# Patient Record
Sex: Female | Born: 1960 | Race: White | Hispanic: No | Marital: Married | Smoking: Never smoker
Health system: Southern US, Community
[De-identification: ages and names within clinical notes are randomized; demographics above are authoritative.]

## PROBLEM LIST (undated history)

## (undated) DIAGNOSIS — Z809 Family history of malignant neoplasm, unspecified: Secondary | ICD-10-CM

## (undated) DIAGNOSIS — B958 Unspecified staphylococcus as the cause of diseases classified elsewhere: Secondary | ICD-10-CM

## (undated) DIAGNOSIS — M1990 Unspecified osteoarthritis, unspecified site: Secondary | ICD-10-CM

## (undated) DIAGNOSIS — B880 Other acariasis: Secondary | ICD-10-CM

## (undated) DIAGNOSIS — M797 Fibromyalgia: Secondary | ICD-10-CM

## (undated) DIAGNOSIS — F329 Major depressive disorder, single episode, unspecified: Secondary | ICD-10-CM

## (undated) DIAGNOSIS — Z8739 Personal history of other diseases of the musculoskeletal system and connective tissue: Secondary | ICD-10-CM

## (undated) DIAGNOSIS — Z1502 Genetic susceptibility to malignant neoplasm of ovary: Secondary | ICD-10-CM

## (undated) DIAGNOSIS — E039 Hypothyroidism, unspecified: Secondary | ICD-10-CM

## (undated) DIAGNOSIS — Z9221 Personal history of antineoplastic chemotherapy: Secondary | ICD-10-CM

## (undated) DIAGNOSIS — Z853 Personal history of malignant neoplasm of breast: Secondary | ICD-10-CM

## (undated) DIAGNOSIS — C8230 Follicular lymphoma grade IIIa, unspecified site: Secondary | ICD-10-CM

## (undated) DIAGNOSIS — Z1501 Genetic susceptibility to malignant neoplasm of breast: Secondary | ICD-10-CM

## (undated) DIAGNOSIS — E785 Hyperlipidemia, unspecified: Secondary | ICD-10-CM

## (undated) DIAGNOSIS — E669 Obesity, unspecified: Secondary | ICD-10-CM

## (undated) DIAGNOSIS — C50911 Malignant neoplasm of unspecified site of right female breast: Secondary | ICD-10-CM

## (undated) DIAGNOSIS — C50919 Malignant neoplasm of unspecified site of unspecified female breast: Secondary | ICD-10-CM

## (undated) DIAGNOSIS — Z8481 Family history of carrier of genetic disease: Secondary | ICD-10-CM

## (undated) DIAGNOSIS — Z973 Presence of spectacles and contact lenses: Secondary | ICD-10-CM

## (undated) DIAGNOSIS — M8580 Other specified disorders of bone density and structure, unspecified site: Secondary | ICD-10-CM

## (undated) DIAGNOSIS — F419 Anxiety disorder, unspecified: Secondary | ICD-10-CM

## (undated) DIAGNOSIS — Z923 Personal history of irradiation: Secondary | ICD-10-CM

## (undated) DIAGNOSIS — F32A Depression, unspecified: Secondary | ICD-10-CM

## (undated) DIAGNOSIS — G620 Drug-induced polyneuropathy: Secondary | ICD-10-CM

## (undated) DIAGNOSIS — N179 Acute kidney failure, unspecified: Secondary | ICD-10-CM

## (undated) DIAGNOSIS — M654 Radial styloid tenosynovitis [de Quervain]: Secondary | ICD-10-CM

## (undated) DIAGNOSIS — Z1509 Genetic susceptibility to other malignant neoplasm: Secondary | ICD-10-CM

## (undated) DIAGNOSIS — G629 Polyneuropathy, unspecified: Secondary | ICD-10-CM

## (undated) DIAGNOSIS — S99912A Unspecified injury of left ankle, initial encounter: Secondary | ICD-10-CM

## (undated) DIAGNOSIS — T451X5A Adverse effect of antineoplastic and immunosuppressive drugs, initial encounter: Secondary | ICD-10-CM

## (undated) HISTORY — DX: Acute kidney failure, unspecified: N17.9

## (undated) HISTORY — PX: OTHER SURGICAL HISTORY: SHX169

## (undated) HISTORY — DX: Other specified disorders of bone density and structure, unspecified site: M85.80

## (undated) HISTORY — DX: Hypothyroidism, unspecified: E03.9

## (undated) HISTORY — DX: Unspecified osteoarthritis, unspecified site: M19.90

## (undated) HISTORY — DX: Family history of malignant neoplasm, unspecified: Z80.9

## (undated) HISTORY — DX: Malignant neoplasm of unspecified site of right female breast: C50.911

## (undated) HISTORY — DX: Family history of carrier of genetic disease: Z84.81

## (undated) HISTORY — DX: Personal history of irradiation: Z92.3

---

## 1989-03-14 HISTORY — PX: MANDIBLE SURGERY: SHX707

## 1997-09-05 ENCOUNTER — Other Ambulatory Visit: Admission: RE | Admit: 1997-09-05 | Discharge: 1997-09-05 | Payer: Self-pay | Admitting: Obstetrics & Gynecology

## 1998-08-26 ENCOUNTER — Other Ambulatory Visit: Admission: RE | Admit: 1998-08-26 | Discharge: 1998-08-26 | Payer: Self-pay | Admitting: Obstetrics & Gynecology

## 1999-10-29 ENCOUNTER — Other Ambulatory Visit: Admission: RE | Admit: 1999-10-29 | Discharge: 1999-10-29 | Payer: Self-pay | Admitting: Obstetrics & Gynecology

## 1999-12-01 ENCOUNTER — Encounter: Payer: Self-pay | Admitting: General Surgery

## 1999-12-01 ENCOUNTER — Encounter: Admission: RE | Admit: 1999-12-01 | Discharge: 1999-12-01 | Payer: Self-pay | Admitting: General Surgery

## 2000-05-16 ENCOUNTER — Encounter: Admission: RE | Admit: 2000-05-16 | Discharge: 2000-05-16 | Payer: Self-pay | Admitting: General Surgery

## 2000-05-16 ENCOUNTER — Encounter: Payer: Self-pay | Admitting: General Surgery

## 2000-11-16 ENCOUNTER — Encounter: Admission: RE | Admit: 2000-11-16 | Discharge: 2000-11-16 | Payer: Self-pay | Admitting: Obstetrics & Gynecology

## 2000-11-16 ENCOUNTER — Encounter: Payer: Self-pay | Admitting: Obstetrics & Gynecology

## 2001-01-25 ENCOUNTER — Other Ambulatory Visit: Admission: RE | Admit: 2001-01-25 | Discharge: 2001-01-25 | Payer: Self-pay | Admitting: Obstetrics & Gynecology

## 2002-01-30 ENCOUNTER — Encounter: Admission: RE | Admit: 2002-01-30 | Discharge: 2002-01-30 | Payer: Self-pay | Admitting: Obstetrics & Gynecology

## 2002-01-30 ENCOUNTER — Encounter: Payer: Self-pay | Admitting: Obstetrics & Gynecology

## 2002-04-04 ENCOUNTER — Other Ambulatory Visit: Admission: RE | Admit: 2002-04-04 | Discharge: 2002-04-04 | Payer: Self-pay | Admitting: Obstetrics & Gynecology

## 2003-04-11 ENCOUNTER — Encounter: Admission: RE | Admit: 2003-04-11 | Discharge: 2003-04-11 | Payer: Self-pay | Admitting: Obstetrics & Gynecology

## 2003-04-16 ENCOUNTER — Other Ambulatory Visit: Admission: RE | Admit: 2003-04-16 | Discharge: 2003-04-16 | Payer: Self-pay | Admitting: Obstetrics & Gynecology

## 2004-12-28 ENCOUNTER — Other Ambulatory Visit: Admission: RE | Admit: 2004-12-28 | Discharge: 2004-12-28 | Payer: Self-pay | Admitting: Obstetrics & Gynecology

## 2005-01-14 ENCOUNTER — Encounter: Admission: RE | Admit: 2005-01-14 | Discharge: 2005-01-14 | Payer: Self-pay | Admitting: Obstetrics & Gynecology

## 2006-03-09 ENCOUNTER — Encounter: Admission: RE | Admit: 2006-03-09 | Discharge: 2006-03-09 | Payer: Self-pay | Admitting: Obstetrics & Gynecology

## 2007-03-12 ENCOUNTER — Encounter: Admission: RE | Admit: 2007-03-12 | Discharge: 2007-03-12 | Payer: Self-pay | Admitting: Obstetrics & Gynecology

## 2008-04-16 ENCOUNTER — Encounter: Admission: RE | Admit: 2008-04-16 | Discharge: 2008-04-16 | Payer: Self-pay | Admitting: Obstetrics & Gynecology

## 2010-03-14 HISTORY — PX: BREAST LUMPECTOMY: SHX2

## 2010-06-04 ENCOUNTER — Other Ambulatory Visit: Payer: Self-pay | Admitting: Obstetrics & Gynecology

## 2010-06-04 DIAGNOSIS — Z1231 Encounter for screening mammogram for malignant neoplasm of breast: Secondary | ICD-10-CM

## 2010-06-09 ENCOUNTER — Ambulatory Visit
Admission: RE | Admit: 2010-06-09 | Discharge: 2010-06-09 | Disposition: A | Discharge status: Home / Self Care | Payer: BC Managed Care – PPO | Source: Ambulatory Visit | Attending: Obstetrics & Gynecology | Admitting: Obstetrics & Gynecology

## 2010-06-09 DIAGNOSIS — Z1231 Encounter for screening mammogram for malignant neoplasm of breast: Secondary | ICD-10-CM

## 2010-06-11 ENCOUNTER — Other Ambulatory Visit: Payer: Self-pay | Admitting: Obstetrics & Gynecology

## 2010-06-11 DIAGNOSIS — R928 Other abnormal and inconclusive findings on diagnostic imaging of breast: Secondary | ICD-10-CM

## 2010-06-16 ENCOUNTER — Ambulatory Visit
Admission: RE | Admit: 2010-06-16 | Discharge: 2010-06-16 | Disposition: A | Discharge status: Home / Self Care | Payer: BC Managed Care – PPO | Source: Ambulatory Visit | Attending: Obstetrics & Gynecology | Admitting: Obstetrics & Gynecology

## 2010-06-16 ENCOUNTER — Other Ambulatory Visit: Payer: Self-pay | Admitting: Obstetrics & Gynecology

## 2010-06-16 DIAGNOSIS — R928 Other abnormal and inconclusive findings on diagnostic imaging of breast: Secondary | ICD-10-CM

## 2010-06-16 DIAGNOSIS — N6310 Unspecified lump in the right breast, unspecified quadrant: Secondary | ICD-10-CM

## 2010-06-21 ENCOUNTER — Ambulatory Visit
Admission: RE | Admit: 2010-06-21 | Discharge: 2010-06-21 | Disposition: A | Discharge status: Home / Self Care | Payer: BC Managed Care – PPO | Source: Ambulatory Visit | Attending: Obstetrics & Gynecology | Admitting: Obstetrics & Gynecology

## 2010-06-21 ENCOUNTER — Other Ambulatory Visit: Payer: Self-pay | Admitting: Obstetrics & Gynecology

## 2010-06-21 ENCOUNTER — Other Ambulatory Visit: Payer: Self-pay | Admitting: Diagnostic Radiology

## 2010-06-21 DIAGNOSIS — N6310 Unspecified lump in the right breast, unspecified quadrant: Secondary | ICD-10-CM

## 2010-06-21 DIAGNOSIS — R928 Other abnormal and inconclusive findings on diagnostic imaging of breast: Secondary | ICD-10-CM

## 2010-06-22 ENCOUNTER — Other Ambulatory Visit: Payer: Self-pay | Admitting: Obstetrics & Gynecology

## 2010-06-22 DIAGNOSIS — C50911 Malignant neoplasm of unspecified site of right female breast: Secondary | ICD-10-CM

## 2010-06-25 ENCOUNTER — Ambulatory Visit
Admission: RE | Admit: 2010-06-25 | Discharge: 2010-06-25 | Disposition: A | Discharge status: Home / Self Care | Payer: BC Managed Care – PPO | Source: Ambulatory Visit | Attending: Obstetrics & Gynecology | Admitting: Obstetrics & Gynecology

## 2010-06-25 DIAGNOSIS — C50911 Malignant neoplasm of unspecified site of right female breast: Secondary | ICD-10-CM

## 2010-06-25 MED ORDER — GADOBENATE DIMEGLUMINE 529 MG/ML IV SOLN
17.0000 mL | Freq: Once | INTRAVENOUS | Status: AC | PRN
  Administered 2010-06-25: 17 mL via INTRAVENOUS

## 2010-06-30 ENCOUNTER — Other Ambulatory Visit: Payer: Self-pay | Admitting: Oncology

## 2010-06-30 ENCOUNTER — Other Ambulatory Visit: Payer: Self-pay | Admitting: General Surgery

## 2010-06-30 ENCOUNTER — Encounter (HOSPITAL_BASED_OUTPATIENT_CLINIC_OR_DEPARTMENT_OTHER): Payer: BC Managed Care – PPO | Admitting: Oncology

## 2010-06-30 DIAGNOSIS — C50919 Malignant neoplasm of unspecified site of unspecified female breast: Secondary | ICD-10-CM

## 2010-06-30 DIAGNOSIS — C50419 Malignant neoplasm of upper-outer quadrant of unspecified female breast: Secondary | ICD-10-CM

## 2010-06-30 DIAGNOSIS — E039 Hypothyroidism, unspecified: Secondary | ICD-10-CM

## 2010-06-30 LAB — COMPREHENSIVE METABOLIC PANEL
ALT: 19 U/L (ref 0–35)
AST: 19 U/L (ref 0–37)
Alkaline Phosphatase: 73 U/L (ref 39–117)
Calcium: 9.3 mg/dL (ref 8.4–10.5)
Creatinine, Ser: 0.72 mg/dL (ref 0.40–1.20)
Glucose, Bld: 114 mg/dL — ABNORMAL HIGH (ref 70–99)
Potassium: 3.5 mEq/L (ref 3.5–5.3)
Total Bilirubin: 1.1 mg/dL (ref 0.3–1.2)

## 2010-06-30 LAB — CBC WITH DIFFERENTIAL/PLATELET
BASO%: 0.6 % (ref 0.0–2.0)
Basophils Absolute: 0.1 10*3/uL (ref 0.0–0.1)
Eosinophils Absolute: 0.1 10*3/uL (ref 0.0–0.5)
HGB: 14.5 g/dL (ref 11.6–15.9)
MCH: 31.3 pg (ref 25.1–34.0)
MCHC: 34.7 g/dL (ref 31.5–36.0)
MCV: 90 fL (ref 79.5–101.0)
MONO#: 0.6 10*3/uL (ref 0.1–0.9)
Platelets: 172 10*3/uL (ref 145–400)

## 2010-07-02 ENCOUNTER — Encounter: Payer: BC Managed Care – PPO | Admitting: Genetic Counselor

## 2010-07-06 ENCOUNTER — Ambulatory Visit (HOSPITAL_COMMUNITY)
Admission: RE | Admit: 2010-07-06 | Discharge: 2010-07-06 | Disposition: A | Discharge status: Home / Self Care | Payer: BC Managed Care – PPO | Source: Ambulatory Visit | Attending: Oncology | Admitting: Oncology

## 2010-07-06 ENCOUNTER — Encounter (HOSPITAL_COMMUNITY)
Admission: RE | Admit: 2010-07-06 | Discharge: 2010-07-06 | Disposition: A | Discharge status: Home / Self Care | Payer: BC Managed Care – PPO | Source: Ambulatory Visit | Attending: Oncology | Admitting: Oncology

## 2010-07-06 ENCOUNTER — Encounter (HOSPITAL_COMMUNITY)
Admission: RE | Admit: 2010-07-06 | Discharge: 2010-07-06 | Disposition: A | Discharge status: Home / Self Care | Payer: BC Managed Care – PPO | Source: Ambulatory Visit | Attending: General Surgery | Admitting: General Surgery

## 2010-07-06 ENCOUNTER — Encounter (HOSPITAL_COMMUNITY): Payer: Self-pay

## 2010-07-06 ENCOUNTER — Ambulatory Visit (HOSPITAL_COMMUNITY)
Admission: RE | Admit: 2010-07-06 | Discharge: 2010-07-06 | Disposition: A | Discharge status: Home / Self Care | Payer: BC Managed Care – PPO | Source: Ambulatory Visit | Attending: General Surgery | Admitting: General Surgery

## 2010-07-06 ENCOUNTER — Other Ambulatory Visit (HOSPITAL_COMMUNITY): Payer: Self-pay | Admitting: General Surgery

## 2010-07-06 DIAGNOSIS — C50919 Malignant neoplasm of unspecified site of unspecified female breast: Secondary | ICD-10-CM

## 2010-07-06 DIAGNOSIS — C50419 Malignant neoplasm of upper-outer quadrant of unspecified female breast: Secondary | ICD-10-CM | POA: Insufficient documentation

## 2010-07-06 DIAGNOSIS — Z01810 Encounter for preprocedural cardiovascular examination: Secondary | ICD-10-CM | POA: Insufficient documentation

## 2010-07-06 DIAGNOSIS — R05 Cough: Secondary | ICD-10-CM | POA: Insufficient documentation

## 2010-07-06 DIAGNOSIS — Z01818 Encounter for other preprocedural examination: Secondary | ICD-10-CM | POA: Insufficient documentation

## 2010-07-06 DIAGNOSIS — Z01812 Encounter for preprocedural laboratory examination: Secondary | ICD-10-CM | POA: Insufficient documentation

## 2010-07-06 DIAGNOSIS — C50911 Malignant neoplasm of unspecified site of right female breast: Secondary | ICD-10-CM

## 2010-07-06 DIAGNOSIS — R059 Cough, unspecified: Secondary | ICD-10-CM | POA: Insufficient documentation

## 2010-07-06 LAB — SURGICAL PCR SCREEN
MRSA, PCR: NEGATIVE
Staphylococcus aureus: POSITIVE — AB

## 2010-07-06 MED ORDER — TECHNETIUM TC 99M MEDRONATE IV KIT
23.6000 | PACK | Freq: Once | INTRAVENOUS | Status: AC | PRN
  Administered 2010-07-06: 23.6 via INTRAVENOUS

## 2010-07-06 MED ORDER — IOHEXOL 300 MG/ML  SOLN
80.0000 mL | Freq: Once | INTRAMUSCULAR | Status: AC | PRN
  Administered 2010-07-06: 80 mL via INTRAVENOUS

## 2010-07-08 ENCOUNTER — Ambulatory Visit
Admission: RE | Admit: 2010-07-08 | Discharge: 2010-07-08 | Disposition: A | Discharge status: Home / Self Care | Payer: BC Managed Care – PPO | Source: Ambulatory Visit | Attending: General Surgery | Admitting: General Surgery

## 2010-07-08 ENCOUNTER — Observation Stay (HOSPITAL_COMMUNITY)
Admission: RE | Admit: 2010-07-08 | Discharge: 2010-07-09 | Disposition: A | Discharge status: Home / Self Care | Payer: BC Managed Care – PPO | Source: Ambulatory Visit | Attending: General Surgery | Admitting: General Surgery

## 2010-07-08 ENCOUNTER — Ambulatory Visit (HOSPITAL_COMMUNITY): Payer: BC Managed Care – PPO

## 2010-07-08 ENCOUNTER — Other Ambulatory Visit: Payer: Self-pay | Admitting: General Surgery

## 2010-07-08 DIAGNOSIS — C50919 Malignant neoplasm of unspecified site of unspecified female breast: Secondary | ICD-10-CM

## 2010-07-08 DIAGNOSIS — C773 Secondary and unspecified malignant neoplasm of axilla and upper limb lymph nodes: Secondary | ICD-10-CM | POA: Insufficient documentation

## 2010-07-08 HISTORY — PX: BREAST LUMPECTOMY WITH AXILLARY LYMPH NODE BIOPSY: SHX5593

## 2010-07-20 ENCOUNTER — Ambulatory Visit (HOSPITAL_COMMUNITY)
Admission: RE | Admit: 2010-07-20 | Discharge: 2010-07-20 | Disposition: A | Discharge status: Home / Self Care | Payer: BC Managed Care – PPO | Source: Ambulatory Visit | Attending: Oncology | Admitting: Oncology

## 2010-07-20 DIAGNOSIS — Z8249 Family history of ischemic heart disease and other diseases of the circulatory system: Secondary | ICD-10-CM | POA: Insufficient documentation

## 2010-07-20 DIAGNOSIS — C50919 Malignant neoplasm of unspecified site of unspecified female breast: Secondary | ICD-10-CM | POA: Insufficient documentation

## 2010-07-20 DIAGNOSIS — I050 Rheumatic mitral stenosis: Secondary | ICD-10-CM | POA: Insufficient documentation

## 2010-07-20 NOTE — Op Note (Signed)
NAMECAMELLIA, [name]             ACCOUNT NO.:  [id number]  MEDICAL RECORD NO.:  [id number]           PATIENT TYPE:  O  LOCATION:  5125                         FACILITY:  [facility]  PHYSICIAN:  Almond Lint, MD       DATE OF BIRTH:  04-19-1960  DATE OF PROCEDURE:  07/08/2010 DATE OF DISCHARGE:                              OPERATIVE REPORT   PREOPERATIVE DIAGNOSIS:  Right breast cancer, clinical T1, N1, Mx.  POSTOPERATIVE DIAGNOSIS:  Right breast cancer, clinical T1, N1, Mx.  PROCEDURES:  Left subclavian Port-A-Cath, right needle-localized segmental mastectomy, and right axillary lymph node dissection.  SURGEON:  Almond Lint, MD  ASSISTANT:  Anselm Pancoast. Zachery Dakins, MD  ANESTHESIA:  General and local.  FINDINGS: 1. Port-A-Cath in the SVC, good aspiration and easy flush. 2. Clip and  wire in breast specimen of the segmental mastectomy for     the axillary lymph node dissection, multiple small palpable nodes,     and good thoracodorsal and long thoracic nerve function at the     beginning and end of the operation.  SPECIMENS:  Right needle-localized segmental mastectomy and right axillary lymph node dissection to Pathology.  ESTIMATED BLOOD LOSS:  Minimal.  COMPLICATIONS:  None known.  PROCEDURE:  [name] was identified in holding area and taken to the operating room where she was placed on the operating table.  General anesthesia was induced.  The shoulder roll was placed and her left arm was tucked.  Her upper chest, neck, and right arm were prepped and draped in a sterile fashion.  A time-out was performed according to surgical safety checklist.  When all was correct, we continued.  The left infraclavicular region was anesthetized with local anesthetic and the subclavian vein was accessed with a large bore needle.  The wire passed easily and ectopy was seen.  Fluoroscopy was used to confirm the wire was in the venous system.  The site for the Port-A-Cath pocket  was then created with a #15 blade.  This was approximately a 3.5-cm incision.  The subcutaneous tissues were divided all the way to the pectoralis fascia and the pocket was created on the pectoralis fascia. The Port-A-Cath was secured down to the pectoralis fascia with 2-0 Prolene x4.  A tunneler was used to pull the catheter through the wire exit site.  The skin was nicked with a #11 blade.  The tunneler and dilator sheath were passed over the wire under fluoroscopy and then dilator, sheath, and wire were removed.  The catheter was advanced through the tunneler sheaths and the tunneler sheath was pulled away. Fluoroscopy was used to confirm.  Catheter was in good position in the SVC.  The Port-A-Cath was aspirated and flushed with dilute heparinized saline.  This was then flushed with concentrated saline and the skin of the incisions was closed with 3-0 Vicryl deep dermal and 4-0 Monocryl running subcuticular stitches.    Attention was then directed to the right breast.  The site of the tumor was identified with an X and a radial incision was made overlying the mass.  Skin hooks were used to assist with creation  of the flaps around the mass.  The wire was pulled through the skin and the breast specimen was elevated with Allis clamps. The curved Mayo scissors were used to take the specimen around sharply. The specimen was then marked with margin marker kit and specimen radiograph confirmed that the hook and the clips were in the specimen as well as the mass.  Hemostasis was then achieved with cautery and the breast incision was packed with few Ray-Tecs.  Attention was then directed to the axilla.  A curvilinear incision was made with a #10 blade.  Thick flaps were created with the cautery.  The lymph node packet was held with 2 Allis clamps and the pectoralis fascia was identified with the cautery.  The small vessels entering this area were clipped.  The lymph node packet was swept off  the lateral chest wall and the  long thoracic nerve was identified and tested.  This was intact.  The tissue overlying this was taken with a combination of cautery, scissors, and clips on the lymphovascular channels.  Attention was directed superiorly.  Sharp dissection combined with clips on the small vessels was then used to identify the undersurface of the axillary vein.  The lymphatic packet was swept off this and the intercostobrachial nerve had to be divided as it went through the center of the lymph node packet.  There was a small vessel coming off axillary vein that was clipped and divided.  The thoracodorsal nerve was identified and was intact.  The lymph node pocket was swept off the anterior aspect of this.  The tissue was then taken in its entirety and passed off the table.  There were several additional lymph nodes that were palpable that were taken as well.  This was then irrigated copiously.  A #19 Blake drain was placed in the axilla.  The skin was then reapproximated with 3-0 Vicryl deep dermal sutures and 4-0 Monocryl running subcuticular sutures.  The wound was then cleaned, dried, and dressed with Steri-Strips, gauze, and the breast binder.  The breast incision was then closed as well with 3-0 Vicryl and 4-0 Monocryl.  All of the incisions were dressed with Steri-Strips, gauze, and the breast binder. The patient was awakened from anesthesia and taken to PACU in stable condition.  Needle, sponge, and instrument counts were correct x2.     Almond Lint, MD     FB/MEDQ  D:  07/08/2010  T:  07/09/2010  Job:  [id number]  Electronically Signed by Almond Lint MD on 07/20/2010 03:44:37 PM

## 2010-07-21 NOTE — Discharge Summary (Signed)
  [name], [name]             ACCOUNT NO.:  [id number]  MEDICAL RECORD NO.:  [id number]           PATIENT TYPE:  O  LOCATION:  5125                         FACILITY:  [facility]  PHYSICIAN:  Almond Lint, MD       DATE OF BIRTH:  1960/07/02  DATE OF ADMISSION:  07/08/2010 DATE OF DISCHARGE:  07/09/2010                              DISCHARGE SUMMARY   FINAL DIAGNOSIS:  Right breast cancer, clinical T1, N1, Mx.  PRINCIPAL DIAGNOSIS:  Left subclavian Port-A-Cath right needle-localized segmental mastectomy, and right axillary lymph node dissection.  SECONDARY DIAGNOSES:  Hypothyroidism and osteoarthritis.  DISCHARGE MEDICATIONS: 1. Ibuprofen 400 mg q.8 hours p.r.n. 2. Vitamin D3 over the counter 1 tablet once a day. 3. Calcium carbonate/vitamin 1 tablet once a day. 4. Multivitamin once a day, 5. Flaxseed oil over the counter once a day. 6. Levothyroxine 75 mcg once a day. 7. Vicodin 5/325 one to two tablets p.o. q.4 hours p.r.n. pain.  DISCHARGE INSTRUCTIONS:  No lifting for 2 weeks while the drain is in. No driving for 1-2 weeks.  No shower while drain is in.  Measure cord JP twice a day.  Breath binder for comfort.  HOSPITAL COURSE:  [name] was admitted for observation and following her right partial mastectomy and axillary lymph node dissection.  She is very well postoperatively with nausea and shortness. She was able to be discharged the following day.  Her wounds were okay and she was able to be discharged in good condition.     Almond Lint, MD     FB/MEDQ  D:  07/20/2010  T:  07/21/2010  Job:  [id number]  Electronically Signed by Almond Lint MD on 07/21/2010 12:11:48 PM

## 2010-08-06 ENCOUNTER — Other Ambulatory Visit: Payer: Self-pay | Admitting: Oncology

## 2010-08-06 ENCOUNTER — Encounter (HOSPITAL_BASED_OUTPATIENT_CLINIC_OR_DEPARTMENT_OTHER): Payer: BC Managed Care – PPO | Admitting: Oncology

## 2010-08-06 DIAGNOSIS — C50419 Malignant neoplasm of upper-outer quadrant of unspecified female breast: Secondary | ICD-10-CM

## 2010-08-06 DIAGNOSIS — C773 Secondary and unspecified malignant neoplasm of axilla and upper limb lymph nodes: Secondary | ICD-10-CM

## 2010-08-06 LAB — CBC WITH DIFFERENTIAL/PLATELET
Basophils Absolute: 0 10*3/uL (ref 0.0–0.1)
Eosinophils Absolute: 0.2 10*3/uL (ref 0.0–0.5)
HGB: 14.1 g/dL (ref 11.6–15.9)
MCV: 90.2 fL (ref 79.5–101.0)
MONO#: 0.7 10*3/uL (ref 0.1–0.9)
MONO%: 7.3 % (ref 0.0–14.0)
NEUT#: 7.3 10*3/uL — ABNORMAL HIGH (ref 1.5–6.5)
Platelets: 176 10*3/uL (ref 145–400)
RDW: 12.8 % (ref 11.2–14.5)
WBC: 10.1 10*3/uL (ref 3.9–10.3)

## 2010-08-16 ENCOUNTER — Ambulatory Visit: Payer: BC Managed Care – PPO | Attending: Oncology | Admitting: Physical Therapy

## 2010-08-16 DIAGNOSIS — I890 Lymphedema, not elsewhere classified: Secondary | ICD-10-CM | POA: Insufficient documentation

## 2010-08-16 DIAGNOSIS — IMO0001 Reserved for inherently not codable concepts without codable children: Secondary | ICD-10-CM | POA: Insufficient documentation

## 2010-08-18 ENCOUNTER — Ambulatory Visit: Payer: BC Managed Care – PPO | Admitting: Physical Therapy

## 2010-08-19 ENCOUNTER — Encounter (HOSPITAL_BASED_OUTPATIENT_CLINIC_OR_DEPARTMENT_OTHER): Payer: BC Managed Care – PPO | Admitting: Oncology

## 2010-08-19 ENCOUNTER — Other Ambulatory Visit: Payer: Self-pay | Admitting: Oncology

## 2010-08-19 ENCOUNTER — Ambulatory Visit (HOSPITAL_COMMUNITY)
Admission: RE | Admit: 2010-08-19 | Discharge: 2010-08-19 | Disposition: A | Discharge status: Home / Self Care | Payer: BC Managed Care – PPO | Source: Ambulatory Visit | Attending: Oncology | Admitting: Oncology

## 2010-08-19 ENCOUNTER — Encounter: Payer: BC Managed Care – PPO | Admitting: Physical Therapy

## 2010-08-19 DIAGNOSIS — Y849 Medical procedure, unspecified as the cause of abnormal reaction of the patient, or of later complication, without mention of misadventure at the time of the procedure: Secondary | ICD-10-CM | POA: Insufficient documentation

## 2010-08-19 DIAGNOSIS — Z5111 Encounter for antineoplastic chemotherapy: Secondary | ICD-10-CM

## 2010-08-19 DIAGNOSIS — E039 Hypothyroidism, unspecified: Secondary | ICD-10-CM

## 2010-08-19 DIAGNOSIS — R0789 Other chest pain: Secondary | ICD-10-CM

## 2010-08-19 DIAGNOSIS — C50419 Malignant neoplasm of upper-outer quadrant of unspecified female breast: Secondary | ICD-10-CM

## 2010-08-19 DIAGNOSIS — T82898A Other specified complication of vascular prosthetic devices, implants and grafts, initial encounter: Secondary | ICD-10-CM | POA: Insufficient documentation

## 2010-08-19 LAB — CBC WITH DIFFERENTIAL/PLATELET
Basophils Absolute: 0 10*3/uL (ref 0.0–0.1)
Eosinophils Absolute: 0.2 10*3/uL (ref 0.0–0.5)
HCT: 40.9 % (ref 34.8–46.6)
LYMPH%: 31 % (ref 14.0–49.7)
MCV: 86.3 fL (ref 79.5–101.0)
MONO#: 0.9 10*3/uL (ref 0.1–0.9)
MONO%: 10.1 % (ref 0.0–14.0)
NEUT#: 4.7 10*3/uL (ref 1.5–6.5)
NEUT%: 55.5 % (ref 38.4–76.8)
Platelets: 186 10*3/uL (ref 145–400)
RBC: 4.74 10*6/uL (ref 3.70–5.45)
WBC: 8.4 10*3/uL (ref 3.9–10.3)
nRBC: 0 % (ref 0–0)

## 2010-08-20 ENCOUNTER — Encounter (HOSPITAL_BASED_OUTPATIENT_CLINIC_OR_DEPARTMENT_OTHER): Payer: BC Managed Care – PPO | Admitting: Oncology

## 2010-08-20 DIAGNOSIS — C50419 Malignant neoplasm of upper-outer quadrant of unspecified female breast: Secondary | ICD-10-CM

## 2010-08-20 DIAGNOSIS — Z5189 Encounter for other specified aftercare: Secondary | ICD-10-CM

## 2010-08-23 ENCOUNTER — Ambulatory Visit: Payer: BC Managed Care – PPO | Admitting: Physical Therapy

## 2010-08-25 ENCOUNTER — Other Ambulatory Visit: Payer: Self-pay | Admitting: Oncology

## 2010-08-25 ENCOUNTER — Encounter (HOSPITAL_BASED_OUTPATIENT_CLINIC_OR_DEPARTMENT_OTHER): Payer: BC Managed Care – PPO | Admitting: Oncology

## 2010-08-25 ENCOUNTER — Ambulatory Visit: Payer: BC Managed Care – PPO | Admitting: Physical Therapy

## 2010-08-25 DIAGNOSIS — C50419 Malignant neoplasm of upper-outer quadrant of unspecified female breast: Secondary | ICD-10-CM

## 2010-08-25 DIAGNOSIS — E039 Hypothyroidism, unspecified: Secondary | ICD-10-CM

## 2010-08-25 DIAGNOSIS — Z170 Estrogen receptor positive status [ER+]: Secondary | ICD-10-CM

## 2010-08-25 LAB — CBC WITH DIFFERENTIAL/PLATELET
BASO%: 0.6 % (ref 0.0–2.0)
Basophils Absolute: 0 10*3/uL (ref 0.0–0.1)
EOS%: 8.4 % — ABNORMAL HIGH (ref 0.0–7.0)
HCT: 39.3 % (ref 34.8–46.6)
HGB: 13.4 g/dL (ref 11.6–15.9)
LYMPH%: 43.6 % (ref 14.0–49.7)
MCH: 29.7 pg (ref 25.1–34.0)
MCHC: 34.1 g/dL (ref 31.5–36.0)
MCV: 87.1 fL (ref 79.5–101.0)
MONO%: 3.9 % (ref 0.0–14.0)
NEUT%: 43.5 % (ref 38.4–76.8)
Platelets: 104 10*3/uL — ABNORMAL LOW (ref 145–400)
lymph#: 0.8 10*3/uL — ABNORMAL LOW (ref 0.9–3.3)

## 2010-08-30 ENCOUNTER — Ambulatory Visit: Payer: BC Managed Care – PPO

## 2010-09-01 ENCOUNTER — Ambulatory Visit: Payer: BC Managed Care – PPO | Admitting: Physical Therapy

## 2010-09-02 ENCOUNTER — Encounter (HOSPITAL_BASED_OUTPATIENT_CLINIC_OR_DEPARTMENT_OTHER): Payer: BC Managed Care – PPO | Admitting: Oncology

## 2010-09-02 ENCOUNTER — Other Ambulatory Visit: Payer: Self-pay | Admitting: Oncology

## 2010-09-02 DIAGNOSIS — C773 Secondary and unspecified malignant neoplasm of axilla and upper limb lymph nodes: Secondary | ICD-10-CM

## 2010-09-02 DIAGNOSIS — C50419 Malignant neoplasm of upper-outer quadrant of unspecified female breast: Secondary | ICD-10-CM

## 2010-09-02 DIAGNOSIS — Z5111 Encounter for antineoplastic chemotherapy: Secondary | ICD-10-CM

## 2010-09-02 LAB — COMPREHENSIVE METABOLIC PANEL
AST: 21 U/L (ref 0–37)
Alkaline Phosphatase: 107 U/L (ref 39–117)
BUN: 7 mg/dL (ref 6–23)
Glucose, Bld: 101 mg/dL — ABNORMAL HIGH (ref 70–99)
Sodium: 139 mEq/L (ref 135–145)
Total Bilirubin: 0.3 mg/dL (ref 0.3–1.2)

## 2010-09-02 LAB — CBC WITH DIFFERENTIAL/PLATELET
BASO%: 0.8 % (ref 0.0–2.0)
Basophils Absolute: 0.1 10*3/uL (ref 0.0–0.1)
EOS%: 0.4 % (ref 0.0–7.0)
HGB: 13.2 g/dL (ref 11.6–15.9)
MCH: 29.7 pg (ref 25.1–34.0)
MCHC: 34.3 g/dL (ref 31.5–36.0)
MCV: 86.7 fL (ref 79.5–101.0)
MONO%: 5.9 % (ref 0.0–14.0)
NEUT%: 74.8 % (ref 38.4–76.8)
RDW: 12.8 % (ref 11.2–14.5)
lymph#: 2 10*3/uL (ref 0.9–3.3)

## 2010-09-03 ENCOUNTER — Encounter (HOSPITAL_BASED_OUTPATIENT_CLINIC_OR_DEPARTMENT_OTHER): Payer: BC Managed Care – PPO | Admitting: Oncology

## 2010-09-03 DIAGNOSIS — Z5111 Encounter for antineoplastic chemotherapy: Secondary | ICD-10-CM

## 2010-09-03 DIAGNOSIS — C773 Secondary and unspecified malignant neoplasm of axilla and upper limb lymph nodes: Secondary | ICD-10-CM

## 2010-09-03 DIAGNOSIS — C50419 Malignant neoplasm of upper-outer quadrant of unspecified female breast: Secondary | ICD-10-CM

## 2010-09-08 ENCOUNTER — Encounter: Payer: BC Managed Care – PPO | Admitting: Physical Therapy

## 2010-09-09 ENCOUNTER — Other Ambulatory Visit: Payer: Self-pay | Admitting: Oncology

## 2010-09-09 ENCOUNTER — Encounter (HOSPITAL_BASED_OUTPATIENT_CLINIC_OR_DEPARTMENT_OTHER): Payer: BC Managed Care – PPO | Admitting: Oncology

## 2010-09-09 DIAGNOSIS — Z170 Estrogen receptor positive status [ER+]: Secondary | ICD-10-CM

## 2010-09-09 DIAGNOSIS — C50419 Malignant neoplasm of upper-outer quadrant of unspecified female breast: Secondary | ICD-10-CM

## 2010-09-09 DIAGNOSIS — C773 Secondary and unspecified malignant neoplasm of axilla and upper limb lymph nodes: Secondary | ICD-10-CM

## 2010-09-09 LAB — CBC WITH DIFFERENTIAL/PLATELET
Basophils Absolute: 0 10*3/uL (ref 0.0–0.1)
Eosinophils Absolute: 0 10*3/uL (ref 0.0–0.5)
HGB: 13.5 g/dL (ref 11.6–15.9)
LYMPH%: 52.8 % — ABNORMAL HIGH (ref 14.0–49.7)
MCH: 29.6 pg (ref 25.1–34.0)
MCV: 84.9 fL (ref 79.5–101.0)
MONO%: 12.7 % (ref 0.0–14.0)
NEUT#: 0.4 10*3/uL — CL (ref 1.5–6.5)
NEUT%: 30.3 % — ABNORMAL LOW (ref 38.4–76.8)
Platelets: 101 10*3/uL — ABNORMAL LOW (ref 145–400)

## 2010-09-13 ENCOUNTER — Encounter: Payer: BC Managed Care – PPO | Admitting: Physical Therapy

## 2010-09-16 ENCOUNTER — Other Ambulatory Visit: Payer: Self-pay | Admitting: Oncology

## 2010-09-16 ENCOUNTER — Encounter (HOSPITAL_BASED_OUTPATIENT_CLINIC_OR_DEPARTMENT_OTHER): Payer: BC Managed Care – PPO | Admitting: Oncology

## 2010-09-16 DIAGNOSIS — C50419 Malignant neoplasm of upper-outer quadrant of unspecified female breast: Secondary | ICD-10-CM

## 2010-09-16 DIAGNOSIS — C773 Secondary and unspecified malignant neoplasm of axilla and upper limb lymph nodes: Secondary | ICD-10-CM

## 2010-09-16 DIAGNOSIS — E039 Hypothyroidism, unspecified: Secondary | ICD-10-CM

## 2010-09-16 DIAGNOSIS — Z5111 Encounter for antineoplastic chemotherapy: Secondary | ICD-10-CM

## 2010-09-16 LAB — CBC WITH DIFFERENTIAL/PLATELET
BASO%: 0.8 % (ref 0.0–2.0)
EOS%: 0.1 % (ref 0.0–7.0)
Eosinophils Absolute: 0 10*3/uL (ref 0.0–0.5)
LYMPH%: 16 % (ref 14.0–49.7)
MCH: 29.8 pg (ref 25.1–34.0)
MCHC: 34.9 g/dL (ref 31.5–36.0)
MCV: 85.5 fL (ref 79.5–101.0)
MONO%: 8.9 % (ref 0.0–14.0)
Platelets: 110 10*3/uL — ABNORMAL LOW (ref 145–400)
RBC: 3.79 10*6/uL (ref 3.70–5.45)

## 2010-09-17 ENCOUNTER — Encounter (HOSPITAL_BASED_OUTPATIENT_CLINIC_OR_DEPARTMENT_OTHER): Payer: BC Managed Care – PPO | Admitting: Oncology

## 2010-09-17 DIAGNOSIS — Z5111 Encounter for antineoplastic chemotherapy: Secondary | ICD-10-CM

## 2010-09-17 DIAGNOSIS — C50419 Malignant neoplasm of upper-outer quadrant of unspecified female breast: Secondary | ICD-10-CM

## 2010-09-17 DIAGNOSIS — D702 Other drug-induced agranulocytosis: Secondary | ICD-10-CM

## 2010-09-23 ENCOUNTER — Other Ambulatory Visit: Payer: Self-pay | Admitting: Oncology

## 2010-09-23 ENCOUNTER — Encounter (HOSPITAL_BASED_OUTPATIENT_CLINIC_OR_DEPARTMENT_OTHER): Payer: BC Managed Care – PPO | Admitting: Oncology

## 2010-09-23 DIAGNOSIS — E039 Hypothyroidism, unspecified: Secondary | ICD-10-CM

## 2010-09-23 DIAGNOSIS — C50419 Malignant neoplasm of upper-outer quadrant of unspecified female breast: Secondary | ICD-10-CM

## 2010-09-23 DIAGNOSIS — C773 Secondary and unspecified malignant neoplasm of axilla and upper limb lymph nodes: Secondary | ICD-10-CM

## 2010-09-23 LAB — CBC WITH DIFFERENTIAL/PLATELET
BASO%: 1.8 % (ref 0.0–2.0)
LYMPH%: 56.9 % — ABNORMAL HIGH (ref 14.0–49.7)
MCHC: 34.8 g/dL (ref 31.5–36.0)
MONO#: 0.1 10*3/uL (ref 0.1–0.9)
Platelets: 85 10*3/uL — ABNORMAL LOW (ref 145–400)
RBC: 4.15 10*6/uL (ref 3.70–5.45)
WBC: 1.1 10*3/uL — ABNORMAL LOW (ref 3.9–10.3)
nRBC: 0 % (ref 0–0)

## 2010-09-30 ENCOUNTER — Other Ambulatory Visit: Payer: Self-pay | Admitting: Oncology

## 2010-09-30 ENCOUNTER — Encounter (HOSPITAL_BASED_OUTPATIENT_CLINIC_OR_DEPARTMENT_OTHER): Payer: BC Managed Care – PPO | Admitting: Oncology

## 2010-09-30 ENCOUNTER — Ambulatory Visit (HOSPITAL_COMMUNITY)
Admission: RE | Admit: 2010-09-30 | Discharge: 2010-09-30 | Disposition: A | Discharge status: Home / Self Care | Payer: BC Managed Care – PPO | Source: Ambulatory Visit | Attending: Oncology | Admitting: Oncology

## 2010-09-30 DIAGNOSIS — C50419 Malignant neoplasm of upper-outer quadrant of unspecified female breast: Secondary | ICD-10-CM

## 2010-09-30 DIAGNOSIS — Z5111 Encounter for antineoplastic chemotherapy: Secondary | ICD-10-CM

## 2010-09-30 DIAGNOSIS — Z09 Encounter for follow-up examination after completed treatment for conditions other than malignant neoplasm: Secondary | ICD-10-CM | POA: Insufficient documentation

## 2010-09-30 DIAGNOSIS — E039 Hypothyroidism, unspecified: Secondary | ICD-10-CM

## 2010-09-30 DIAGNOSIS — C773 Secondary and unspecified malignant neoplasm of axilla and upper limb lymph nodes: Secondary | ICD-10-CM

## 2010-09-30 DIAGNOSIS — Z95828 Presence of other vascular implants and grafts: Secondary | ICD-10-CM

## 2010-09-30 LAB — CBC WITH DIFFERENTIAL/PLATELET
BASO%: 0.7 % (ref 0.0–2.0)
EOS%: 0 % (ref 0.0–7.0)
HGB: 9.9 g/dL — ABNORMAL LOW (ref 11.6–15.9)
MCH: 29.7 pg (ref 25.1–34.0)
MCHC: 34.1 g/dL (ref 31.5–36.0)
RDW: 15 % — ABNORMAL HIGH (ref 11.2–14.5)
lymph#: 1.4 10*3/uL (ref 0.9–3.3)

## 2010-10-01 ENCOUNTER — Encounter (HOSPITAL_BASED_OUTPATIENT_CLINIC_OR_DEPARTMENT_OTHER): Payer: BC Managed Care – PPO | Admitting: Oncology

## 2010-10-01 DIAGNOSIS — C50419 Malignant neoplasm of upper-outer quadrant of unspecified female breast: Secondary | ICD-10-CM

## 2010-10-01 DIAGNOSIS — C773 Secondary and unspecified malignant neoplasm of axilla and upper limb lymph nodes: Secondary | ICD-10-CM

## 2010-10-01 DIAGNOSIS — Z5189 Encounter for other specified aftercare: Secondary | ICD-10-CM

## 2010-10-07 ENCOUNTER — Encounter (HOSPITAL_BASED_OUTPATIENT_CLINIC_OR_DEPARTMENT_OTHER): Payer: BC Managed Care – PPO | Admitting: Oncology

## 2010-10-07 ENCOUNTER — Other Ambulatory Visit: Payer: Self-pay | Admitting: Physician Assistant

## 2010-10-07 DIAGNOSIS — C773 Secondary and unspecified malignant neoplasm of axilla and upper limb lymph nodes: Secondary | ICD-10-CM

## 2010-10-07 DIAGNOSIS — C50419 Malignant neoplasm of upper-outer quadrant of unspecified female breast: Secondary | ICD-10-CM

## 2010-10-07 LAB — COMPREHENSIVE METABOLIC PANEL
ALT: 12 U/L (ref 0–35)
CO2: 29 mEq/L (ref 19–32)
Calcium: 9.2 mg/dL (ref 8.4–10.5)
Chloride: 99 mEq/L (ref 96–112)
Sodium: 136 mEq/L (ref 135–145)
Total Bilirubin: 0.6 mg/dL (ref 0.3–1.2)
Total Protein: 6.5 g/dL (ref 6.0–8.3)

## 2010-10-07 LAB — CBC WITH DIFFERENTIAL/PLATELET
Eosinophils Absolute: 0 10*3/uL (ref 0.0–0.5)
MONO#: 0.2 10*3/uL (ref 0.1–0.9)
NEUT#: 0.5 10*3/uL — ABNORMAL LOW (ref 1.5–6.5)
RBC: 3.68 10*6/uL — ABNORMAL LOW (ref 3.70–5.45)
RDW: 15 % — ABNORMAL HIGH (ref 11.2–14.5)
WBC: 1.3 10*3/uL — ABNORMAL LOW (ref 3.9–10.3)
nRBC: 0 % (ref 0–0)

## 2010-10-14 ENCOUNTER — Encounter (HOSPITAL_BASED_OUTPATIENT_CLINIC_OR_DEPARTMENT_OTHER): Payer: BC Managed Care – PPO | Admitting: Oncology

## 2010-10-14 ENCOUNTER — Other Ambulatory Visit: Payer: Self-pay | Admitting: Oncology

## 2010-10-14 DIAGNOSIS — C50419 Malignant neoplasm of upper-outer quadrant of unspecified female breast: Secondary | ICD-10-CM

## 2010-10-14 DIAGNOSIS — Z5111 Encounter for antineoplastic chemotherapy: Secondary | ICD-10-CM

## 2010-10-14 DIAGNOSIS — R21 Rash and other nonspecific skin eruption: Secondary | ICD-10-CM

## 2010-10-14 DIAGNOSIS — E039 Hypothyroidism, unspecified: Secondary | ICD-10-CM

## 2010-10-14 DIAGNOSIS — R05 Cough: Secondary | ICD-10-CM

## 2010-10-14 LAB — CBC WITH DIFFERENTIAL/PLATELET
BASO%: 0.5 % (ref 0.0–2.0)
EOS%: 0 % (ref 0.0–7.0)
LYMPH%: 10.6 % — ABNORMAL LOW (ref 14.0–49.7)
MCH: 30.3 pg (ref 25.1–34.0)
MCHC: 34 g/dL (ref 31.5–36.0)
MONO#: 0.9 10*3/uL (ref 0.1–0.9)
Platelets: 108 10*3/uL — ABNORMAL LOW (ref 145–400)
RBC: 3.23 10*6/uL — ABNORMAL LOW (ref 3.70–5.45)
WBC: 12.4 10*3/uL — ABNORMAL HIGH (ref 3.9–10.3)
nRBC: 0 % (ref 0–0)

## 2010-10-21 ENCOUNTER — Other Ambulatory Visit: Payer: Self-pay | Admitting: Oncology

## 2010-10-21 ENCOUNTER — Encounter (HOSPITAL_BASED_OUTPATIENT_CLINIC_OR_DEPARTMENT_OTHER): Payer: BC Managed Care – PPO | Admitting: Oncology

## 2010-10-21 DIAGNOSIS — Z170 Estrogen receptor positive status [ER+]: Secondary | ICD-10-CM

## 2010-10-21 DIAGNOSIS — C773 Secondary and unspecified malignant neoplasm of axilla and upper limb lymph nodes: Secondary | ICD-10-CM

## 2010-10-21 DIAGNOSIS — Z5111 Encounter for antineoplastic chemotherapy: Secondary | ICD-10-CM

## 2010-10-21 DIAGNOSIS — C50419 Malignant neoplasm of upper-outer quadrant of unspecified female breast: Secondary | ICD-10-CM

## 2010-10-21 LAB — CBC WITH DIFFERENTIAL/PLATELET
BASO%: 0.4 % (ref 0.0–2.0)
Basophils Absolute: 0 10*3/uL (ref 0.0–0.1)
EOS%: 0.1 % (ref 0.0–7.0)
HGB: 9.9 g/dL — ABNORMAL LOW (ref 11.6–15.9)
MCH: 31.9 pg (ref 25.1–34.0)
MCHC: 34.8 g/dL (ref 31.5–36.0)
MONO%: 10 % (ref 0.0–14.0)
RBC: 3.09 10*6/uL — ABNORMAL LOW (ref 3.70–5.45)
RDW: 19.4 % — ABNORMAL HIGH (ref 11.2–14.5)
lymph#: 1 10*3/uL (ref 0.9–3.3)

## 2010-10-21 LAB — COMPREHENSIVE METABOLIC PANEL
ALT: 30 U/L (ref 0–35)
AST: 21 U/L (ref 0–37)
Albumin: 3.4 g/dL — ABNORMAL LOW (ref 3.5–5.2)
Alkaline Phosphatase: 71 U/L (ref 39–117)
Calcium: 9.4 mg/dL (ref 8.4–10.5)
Chloride: 105 mEq/L (ref 96–112)
Potassium: 3.5 mEq/L (ref 3.5–5.3)
Sodium: 138 mEq/L (ref 135–145)

## 2010-10-28 ENCOUNTER — Other Ambulatory Visit: Payer: Self-pay | Admitting: Oncology

## 2010-10-28 ENCOUNTER — Encounter (HOSPITAL_BASED_OUTPATIENT_CLINIC_OR_DEPARTMENT_OTHER): Payer: BC Managed Care – PPO | Admitting: Oncology

## 2010-10-28 DIAGNOSIS — C50419 Malignant neoplasm of upper-outer quadrant of unspecified female breast: Secondary | ICD-10-CM

## 2010-10-28 DIAGNOSIS — Z5111 Encounter for antineoplastic chemotherapy: Secondary | ICD-10-CM

## 2010-10-28 DIAGNOSIS — C773 Secondary and unspecified malignant neoplasm of axilla and upper limb lymph nodes: Secondary | ICD-10-CM

## 2010-10-28 LAB — CBC WITH DIFFERENTIAL/PLATELET
BASO%: 0.7 % (ref 0.0–2.0)
Basophils Absolute: 0.1 10*3/uL (ref 0.0–0.1)
EOS%: 1.1 % (ref 0.0–7.0)
MCH: 31.5 pg (ref 25.1–34.0)
MCHC: 34.5 g/dL (ref 31.5–36.0)
MCV: 91.2 fL (ref 79.5–101.0)
MONO%: 9.3 % (ref 0.0–14.0)
NEUT%: 74.6 % (ref 38.4–76.8)
RDW: 19.3 % — ABNORMAL HIGH (ref 11.2–14.5)
lymph#: 1.2 10*3/uL (ref 0.9–3.3)

## 2010-11-04 ENCOUNTER — Encounter (HOSPITAL_BASED_OUTPATIENT_CLINIC_OR_DEPARTMENT_OTHER): Payer: BC Managed Care – PPO | Admitting: Oncology

## 2010-11-04 ENCOUNTER — Other Ambulatory Visit: Payer: Self-pay | Admitting: Oncology

## 2010-11-04 DIAGNOSIS — Z5111 Encounter for antineoplastic chemotherapy: Secondary | ICD-10-CM

## 2010-11-04 DIAGNOSIS — C50419 Malignant neoplasm of upper-outer quadrant of unspecified female breast: Secondary | ICD-10-CM

## 2010-11-04 DIAGNOSIS — C773 Secondary and unspecified malignant neoplasm of axilla and upper limb lymph nodes: Secondary | ICD-10-CM

## 2010-11-04 DIAGNOSIS — Z170 Estrogen receptor positive status [ER+]: Secondary | ICD-10-CM

## 2010-11-04 LAB — CBC WITH DIFFERENTIAL/PLATELET
Basophils Absolute: 0.1 10*3/uL (ref 0.0–0.1)
EOS%: 1.5 % (ref 0.0–7.0)
HGB: 10.6 g/dL — ABNORMAL LOW (ref 11.6–15.9)
MCH: 31.4 pg (ref 25.1–34.0)
MCV: 92.3 fL (ref 79.5–101.0)
MONO%: 8.9 % (ref 0.0–14.0)
NEUT#: 4.2 10*3/uL (ref 1.5–6.5)
RBC: 3.38 10*6/uL — ABNORMAL LOW (ref 3.70–5.45)
RDW: 18.5 % — ABNORMAL HIGH (ref 11.2–14.5)
lymph#: 1.2 10*3/uL (ref 0.9–3.3)
nRBC: 0 % (ref 0–0)

## 2010-11-11 ENCOUNTER — Encounter (HOSPITAL_BASED_OUTPATIENT_CLINIC_OR_DEPARTMENT_OTHER): Payer: BC Managed Care – PPO | Admitting: Oncology

## 2010-11-11 ENCOUNTER — Ambulatory Visit (HOSPITAL_COMMUNITY)
Admission: RE | Admit: 2010-11-11 | Discharge: 2010-11-11 | Disposition: A | Discharge status: Home / Self Care | Payer: BC Managed Care – PPO | Source: Ambulatory Visit | Attending: Oncology | Admitting: Oncology

## 2010-11-11 ENCOUNTER — Other Ambulatory Visit: Payer: Self-pay | Admitting: Oncology

## 2010-11-11 DIAGNOSIS — C773 Secondary and unspecified malignant neoplasm of axilla and upper limb lymph nodes: Secondary | ICD-10-CM

## 2010-11-11 DIAGNOSIS — R05 Cough: Secondary | ICD-10-CM

## 2010-11-11 DIAGNOSIS — J069 Acute upper respiratory infection, unspecified: Secondary | ICD-10-CM

## 2010-11-11 DIAGNOSIS — Z5111 Encounter for antineoplastic chemotherapy: Secondary | ICD-10-CM

## 2010-11-11 DIAGNOSIS — C50919 Malignant neoplasm of unspecified site of unspecified female breast: Secondary | ICD-10-CM | POA: Insufficient documentation

## 2010-11-11 DIAGNOSIS — C50419 Malignant neoplasm of upper-outer quadrant of unspecified female breast: Secondary | ICD-10-CM

## 2010-11-11 DIAGNOSIS — R059 Cough, unspecified: Secondary | ICD-10-CM | POA: Insufficient documentation

## 2010-11-11 LAB — CBC WITH DIFFERENTIAL/PLATELET
BASO%: 0.7 % (ref 0.0–2.0)
Eosinophils Absolute: 0.1 10*3/uL (ref 0.0–0.5)
MCHC: 34.4 g/dL (ref 31.5–36.0)
MONO#: 0.5 10*3/uL (ref 0.1–0.9)
NEUT#: 4 10*3/uL (ref 1.5–6.5)
RBC: 3.23 10*6/uL — ABNORMAL LOW (ref 3.70–5.45)
RDW: 17.9 % — ABNORMAL HIGH (ref 11.2–14.5)
WBC: 5.9 10*3/uL (ref 3.9–10.3)
lymph#: 1.3 10*3/uL (ref 0.9–3.3)

## 2010-11-18 ENCOUNTER — Encounter (HOSPITAL_BASED_OUTPATIENT_CLINIC_OR_DEPARTMENT_OTHER): Payer: BC Managed Care – PPO | Admitting: Oncology

## 2010-11-18 ENCOUNTER — Other Ambulatory Visit: Payer: Self-pay | Admitting: Oncology

## 2010-11-18 DIAGNOSIS — C773 Secondary and unspecified malignant neoplasm of axilla and upper limb lymph nodes: Secondary | ICD-10-CM

## 2010-11-18 DIAGNOSIS — Z5111 Encounter for antineoplastic chemotherapy: Secondary | ICD-10-CM

## 2010-11-18 DIAGNOSIS — C50419 Malignant neoplasm of upper-outer quadrant of unspecified female breast: Secondary | ICD-10-CM

## 2010-11-18 LAB — CBC WITH DIFFERENTIAL/PLATELET
Eosinophils Absolute: 0.2 10*3/uL (ref 0.0–0.5)
HGB: 11 g/dL — ABNORMAL LOW (ref 11.6–15.9)
LYMPH%: 21.5 % (ref 14.0–49.7)
MONO#: 0.6 10*3/uL (ref 0.1–0.9)
NEUT#: 3.4 10*3/uL (ref 1.5–6.5)
Platelets: 200 10*3/uL (ref 145–400)
RBC: 3.4 10*6/uL — ABNORMAL LOW (ref 3.70–5.45)
WBC: 5.4 10*3/uL (ref 3.9–10.3)
nRBC: 0 % (ref 0–0)

## 2010-11-25 ENCOUNTER — Other Ambulatory Visit: Payer: Self-pay | Admitting: Oncology

## 2010-11-25 ENCOUNTER — Encounter (HOSPITAL_BASED_OUTPATIENT_CLINIC_OR_DEPARTMENT_OTHER): Payer: BC Managed Care – PPO | Admitting: Oncology

## 2010-11-25 DIAGNOSIS — C50419 Malignant neoplasm of upper-outer quadrant of unspecified female breast: Secondary | ICD-10-CM

## 2010-11-25 DIAGNOSIS — M79609 Pain in unspecified limb: Secondary | ICD-10-CM

## 2010-11-25 DIAGNOSIS — C773 Secondary and unspecified malignant neoplasm of axilla and upper limb lymph nodes: Secondary | ICD-10-CM

## 2010-11-25 DIAGNOSIS — Z5111 Encounter for antineoplastic chemotherapy: Secondary | ICD-10-CM

## 2010-11-25 LAB — CBC WITH DIFFERENTIAL/PLATELET
Basophils Absolute: 0 10*3/uL (ref 0.0–0.1)
HCT: 32.3 % — ABNORMAL LOW (ref 34.8–46.6)
HGB: 11.2 g/dL — ABNORMAL LOW (ref 11.6–15.9)
MONO#: 0.5 10*3/uL (ref 0.1–0.9)
NEUT%: 65.3 % (ref 38.4–76.8)
WBC: 5.2 10*3/uL (ref 3.9–10.3)
lymph#: 1.1 10*3/uL (ref 0.9–3.3)

## 2010-12-02 ENCOUNTER — Other Ambulatory Visit: Payer: Self-pay | Admitting: Oncology

## 2010-12-02 ENCOUNTER — Encounter (HOSPITAL_BASED_OUTPATIENT_CLINIC_OR_DEPARTMENT_OTHER): Payer: BC Managed Care – PPO | Admitting: Oncology

## 2010-12-02 DIAGNOSIS — Z5111 Encounter for antineoplastic chemotherapy: Secondary | ICD-10-CM

## 2010-12-02 DIAGNOSIS — C773 Secondary and unspecified malignant neoplasm of axilla and upper limb lymph nodes: Secondary | ICD-10-CM

## 2010-12-02 DIAGNOSIS — G622 Polyneuropathy due to other toxic agents: Secondary | ICD-10-CM

## 2010-12-02 DIAGNOSIS — Z170 Estrogen receptor positive status [ER+]: Secondary | ICD-10-CM

## 2010-12-02 DIAGNOSIS — C50419 Malignant neoplasm of upper-outer quadrant of unspecified female breast: Secondary | ICD-10-CM

## 2010-12-02 LAB — CBC WITH DIFFERENTIAL/PLATELET
Basophils Absolute: 0.1 10*3/uL (ref 0.0–0.1)
EOS%: 2.4 % (ref 0.0–7.0)
HCT: 33.3 % — ABNORMAL LOW (ref 34.8–46.6)
HGB: 11.5 g/dL — ABNORMAL LOW (ref 11.6–15.9)
MCH: 32.9 pg (ref 25.1–34.0)
MCV: 95.1 fL (ref 79.5–101.0)
MONO%: 9.1 % (ref 0.0–14.0)
NEUT%: 63.9 % (ref 38.4–76.8)
Platelets: 237 10*3/uL (ref 145–400)

## 2010-12-09 ENCOUNTER — Other Ambulatory Visit: Payer: Self-pay | Admitting: Oncology

## 2010-12-09 ENCOUNTER — Encounter (HOSPITAL_BASED_OUTPATIENT_CLINIC_OR_DEPARTMENT_OTHER): Payer: BC Managed Care – PPO | Admitting: Oncology

## 2010-12-09 DIAGNOSIS — Z170 Estrogen receptor positive status [ER+]: Secondary | ICD-10-CM

## 2010-12-09 DIAGNOSIS — C773 Secondary and unspecified malignant neoplasm of axilla and upper limb lymph nodes: Secondary | ICD-10-CM

## 2010-12-09 DIAGNOSIS — Z5111 Encounter for antineoplastic chemotherapy: Secondary | ICD-10-CM

## 2010-12-09 DIAGNOSIS — C50419 Malignant neoplasm of upper-outer quadrant of unspecified female breast: Secondary | ICD-10-CM

## 2010-12-09 LAB — COMPREHENSIVE METABOLIC PANEL
Alkaline Phosphatase: 84 U/L (ref 39–117)
Creatinine, Ser: 0.54 mg/dL (ref 0.50–1.10)
Glucose, Bld: 90 mg/dL (ref 70–99)
Sodium: 138 mEq/L (ref 135–145)
Total Bilirubin: 0.5 mg/dL (ref 0.3–1.2)
Total Protein: 6.4 g/dL (ref 6.0–8.3)

## 2010-12-09 LAB — CBC WITH DIFFERENTIAL/PLATELET
Basophils Absolute: 0.1 10*3/uL (ref 0.0–0.1)
EOS%: 4 % (ref 0.0–7.0)
HGB: 11.6 g/dL (ref 11.6–15.9)
MCH: 32.5 pg (ref 25.1–34.0)
MCV: 95.2 fL (ref 79.5–101.0)
MONO%: 16.9 % — ABNORMAL HIGH (ref 0.0–14.0)
RBC: 3.57 10*6/uL — ABNORMAL LOW (ref 3.70–5.45)
RDW: 13.4 % (ref 11.2–14.5)

## 2010-12-16 ENCOUNTER — Other Ambulatory Visit: Payer: Self-pay | Admitting: Oncology

## 2010-12-16 ENCOUNTER — Encounter (HOSPITAL_BASED_OUTPATIENT_CLINIC_OR_DEPARTMENT_OTHER): Payer: BC Managed Care – PPO | Admitting: Oncology

## 2010-12-16 DIAGNOSIS — Z5111 Encounter for antineoplastic chemotherapy: Secondary | ICD-10-CM

## 2010-12-16 DIAGNOSIS — C773 Secondary and unspecified malignant neoplasm of axilla and upper limb lymph nodes: Secondary | ICD-10-CM

## 2010-12-16 DIAGNOSIS — Z170 Estrogen receptor positive status [ER+]: Secondary | ICD-10-CM

## 2010-12-16 DIAGNOSIS — C50419 Malignant neoplasm of upper-outer quadrant of unspecified female breast: Secondary | ICD-10-CM

## 2010-12-16 LAB — CBC WITH DIFFERENTIAL/PLATELET
Basophils Absolute: 0.1 10*3/uL (ref 0.0–0.1)
Eosinophils Absolute: 0.3 10*3/uL (ref 0.0–0.5)
HCT: 35 % (ref 34.8–46.6)
HGB: 12 g/dL (ref 11.6–15.9)
LYMPH%: 23.8 % (ref 14.0–49.7)
MCV: 94.1 fL (ref 79.5–101.0)
MONO%: 8.6 % (ref 0.0–14.0)
NEUT#: 4.1 10*3/uL (ref 1.5–6.5)
NEUT%: 61.9 % (ref 38.4–76.8)
Platelets: 222 10*3/uL (ref 145–400)
RBC: 3.72 10*6/uL (ref 3.70–5.45)

## 2010-12-23 ENCOUNTER — Other Ambulatory Visit: Payer: Self-pay | Admitting: Oncology

## 2010-12-23 ENCOUNTER — Encounter (HOSPITAL_BASED_OUTPATIENT_CLINIC_OR_DEPARTMENT_OTHER): Payer: BC Managed Care – PPO | Admitting: Oncology

## 2010-12-23 DIAGNOSIS — C50419 Malignant neoplasm of upper-outer quadrant of unspecified female breast: Secondary | ICD-10-CM

## 2010-12-23 DIAGNOSIS — Z5111 Encounter for antineoplastic chemotherapy: Secondary | ICD-10-CM

## 2010-12-23 DIAGNOSIS — G609 Hereditary and idiopathic neuropathy, unspecified: Secondary | ICD-10-CM

## 2010-12-23 DIAGNOSIS — Z170 Estrogen receptor positive status [ER+]: Secondary | ICD-10-CM

## 2010-12-23 DIAGNOSIS — C50919 Malignant neoplasm of unspecified site of unspecified female breast: Secondary | ICD-10-CM

## 2010-12-23 DIAGNOSIS — C773 Secondary and unspecified malignant neoplasm of axilla and upper limb lymph nodes: Secondary | ICD-10-CM

## 2010-12-23 DIAGNOSIS — Z79899 Other long term (current) drug therapy: Secondary | ICD-10-CM

## 2010-12-23 LAB — CBC WITH DIFFERENTIAL/PLATELET
Basophils Absolute: 0 10*3/uL (ref 0.0–0.1)
Eosinophils Absolute: 0.2 10*3/uL (ref 0.0–0.5)
HGB: 12.4 g/dL (ref 11.6–15.9)
MCV: 94.8 fL (ref 79.5–101.0)
MONO%: 6.8 % (ref 0.0–14.0)
NEUT#: 2.4 10*3/uL (ref 1.5–6.5)
Platelets: 160 10*3/uL (ref 145–400)
RDW: 13.2 % (ref 11.2–14.5)

## 2010-12-27 ENCOUNTER — Encounter (HOSPITAL_COMMUNITY)
Admission: RE | Admit: 2010-12-27 | Discharge: 2010-12-27 | Disposition: A | Discharge status: Home / Self Care | Payer: BC Managed Care – PPO | Source: Ambulatory Visit | Attending: General Surgery | Admitting: General Surgery

## 2010-12-27 LAB — CBC
HCT: 34.7 % — ABNORMAL LOW (ref 36.0–46.0)
MCV: 94 fL (ref 78.0–100.0)
RDW: 13.3 % (ref 11.5–15.5)
WBC: 5.9 10*3/uL (ref 4.0–10.5)

## 2010-12-27 LAB — BASIC METABOLIC PANEL
BUN: 10 mg/dL (ref 6–23)
CO2: 26 mEq/L (ref 19–32)
Chloride: 105 mEq/L (ref 96–112)
Creatinine, Ser: 0.71 mg/dL (ref 0.50–1.10)
Glucose, Bld: 105 mg/dL — ABNORMAL HIGH (ref 70–99)

## 2010-12-28 ENCOUNTER — Ambulatory Visit (HOSPITAL_BASED_OUTPATIENT_CLINIC_OR_DEPARTMENT_OTHER): Admission: RE | Admit: 2010-12-28 | Payer: BC Managed Care – PPO | Source: Ambulatory Visit | Admitting: General Surgery

## 2010-12-28 ENCOUNTER — Ambulatory Visit (HOSPITAL_COMMUNITY)
Admission: RE | Admit: 2010-12-28 | Discharge: 2010-12-28 | Disposition: A | Discharge status: Home / Self Care | Payer: BC Managed Care – PPO | Source: Ambulatory Visit | Attending: General Surgery | Admitting: General Surgery

## 2010-12-28 DIAGNOSIS — Z01812 Encounter for preprocedural laboratory examination: Secondary | ICD-10-CM | POA: Insufficient documentation

## 2010-12-28 DIAGNOSIS — C50919 Malignant neoplasm of unspecified site of unspecified female breast: Secondary | ICD-10-CM | POA: Insufficient documentation

## 2010-12-28 DIAGNOSIS — Z452 Encounter for adjustment and management of vascular access device: Secondary | ICD-10-CM | POA: Insufficient documentation

## 2010-12-28 HISTORY — PX: PORT-A-CATH REMOVAL: SHX5289

## 2010-12-28 NOTE — Op Note (Signed)
  [name], Barnes NO.:  [id number]  MEDICAL RECORD NO.:  [id number]  LOCATION:  SDSC                         FACILITY:  [facility]  PHYSICIAN:  Almond Lint, MD       DATE OF BIRTH:  12/28/1960  DATE OF PROCEDURE:  12/28/2010 DATE OF DISCHARGE:                              OPERATIVE REPORT   PREOPERATIVE DIAGNOSIS:  Right breast cancer.  POSTOPERATIVE DIAGNOSIS:  Right breast cancer.  PROCEDURE:  Removal of left subclavian Port-A-Cath.  SURGEON:  Almond Lint, MD.  ANESTHESIA:  General and local.  FINDINGS:  Capsule around port.  SPECIMEN:  None.  ESTIMATED BLOOD LOSS:  Minimal.  COMPLICATIONS:  None known.  DESCRIPTION OF PROCEDURE:  [name] was identified in the holding area and taken to the operating room where she was placed supine on the operating table.  MAC anesthesia was induced.  Her arms were tucked and her upper chest was prepped and draped in sterile fashion.  Time-out was performed according to the surgical safety check list.  When all was correct, we continued.  The prior incision was anesthetized with local anesthetic.  This was tested for adequate anesthesia with an Adson's and then the incision was made with a #15 blade.    The subcutaneous tissues were divided with the cautery.  Small Weitlaner retractor was used to assist with visualization.  The capsule around the port was opened up. The port was elevated with Kocher clamp and the 4 stitches holding in place were removed.  The port was then pulled out and pressure was held along the tract for 2 minutes.  The cavity was then irrigated and inspected for hemostasis.  This was achieved with the cautery.  The incision was then closed with 3-0 Vicryl, interrupted deep dermal sutures, and 4-0 Monocryl running subcuticular sutures.  The wound was then cleaned, dried, and dressed with Dermabond.  The patient was awakened from MAC anesthesia and taken to the PACU in stable  condition.  Needle, sponge, and instrument counts were correct.     Almond Lint, MD     FB/MEDQ  D:  12/28/2010  T:  12/28/2010  Job:  [id number]  Electronically Signed by Almond Lint MD on 12/28/2010 08:13:12 PM

## 2011-01-05 ENCOUNTER — Ambulatory Visit
Admission: RE | Admit: 2011-01-05 | Discharge: 2011-01-05 | Disposition: A | Discharge status: Home / Self Care | Payer: BC Managed Care – PPO | Source: Ambulatory Visit | Attending: Radiation Oncology | Admitting: Radiation Oncology

## 2011-01-05 ENCOUNTER — Encounter (HOSPITAL_BASED_OUTPATIENT_CLINIC_OR_DEPARTMENT_OTHER): Payer: BC Managed Care – PPO | Admitting: Oncology

## 2011-01-05 DIAGNOSIS — G589 Mononeuropathy, unspecified: Secondary | ICD-10-CM | POA: Insufficient documentation

## 2011-01-05 DIAGNOSIS — Z510 Encounter for antineoplastic radiation therapy: Secondary | ICD-10-CM | POA: Insufficient documentation

## 2011-01-05 DIAGNOSIS — Z170 Estrogen receptor positive status [ER+]: Secondary | ICD-10-CM

## 2011-01-05 DIAGNOSIS — C50919 Malignant neoplasm of unspecified site of unspecified female breast: Secondary | ICD-10-CM

## 2011-01-05 DIAGNOSIS — Z79899 Other long term (current) drug therapy: Secondary | ICD-10-CM

## 2011-01-11 ENCOUNTER — Ambulatory Visit (INDEPENDENT_AMBULATORY_CARE_PROVIDER_SITE_OTHER): Payer: BC Managed Care – PPO | Admitting: General Surgery

## 2011-01-11 ENCOUNTER — Encounter (INDEPENDENT_AMBULATORY_CARE_PROVIDER_SITE_OTHER): Payer: Self-pay | Admitting: General Surgery

## 2011-01-11 VITALS — BP 128/76 | HR 78 | Temp 98.1°F | Resp 16 | Ht 64.0 in | Wt 188.0 lb

## 2011-01-11 DIAGNOSIS — C50911 Malignant neoplasm of unspecified site of right female breast: Secondary | ICD-10-CM

## 2011-01-11 DIAGNOSIS — C50919 Malignant neoplasm of unspecified site of unspecified female breast: Secondary | ICD-10-CM

## 2011-01-11 HISTORY — DX: Malignant neoplasm of unspecified site of right female breast: C50.911

## 2011-01-11 NOTE — Assessment & Plan Note (Signed)
Port was removed for malfunction. Pt to get radiation. Trying to get before end of year. Small seroma still present.   No evidence of disease.

## 2011-01-11 NOTE — Patient Instructions (Signed)
Ok to use heating pad at this point. Take care not to apply directly to skin or to fall asleep with the heating pad on.

## 2011-01-11 NOTE — Progress Notes (Signed)
HISTORY: Patient now 2 weeks status post Port-A-Cath removal. The surgical area is not sore and she's had no wound complications. She occasionally has pain in the right breast and chest wall. She has some nerve pain like she did when she was on the [place].  She has not noticed any masses in her breasts and has not noticed any nipple discharge or skin changes.    EXAM: Head: Normocephalic and atraumatic.  Eyes:  Conjunctivae are normal. Pupils are equal, round, and reactive to light. No scleral icterus.  Neck:  Normal range of motion. Neck supple. No tracheal deviation present. No thyromegaly present.  Resp: No respiratory distress, normal effort. Chest:  R breast with small seroma in R lateral region in anterior axillary line.  No masses, skin changes, or nipple discharge.   Neurological: Alert and oriented to person, place, and time. Coordination normal.  Skin: Skin is warm and dry. No rash noted. No diaphoretic. No erythema. No pallor.  Psychiatric: Normal mood and affect. Normal behavior. Judgment and thought content normal.   ASSESSMENT AND PLAN:   R breast cancer T1cN1a, BCT 07/08/2010 Port was removed for malfunction. Pt to get radiation. Trying to get before end of year. Small seroma still present.   No evidence of disease.         Maudry Diego, MD Surgical Oncology, General & Endocrine Surgery [facility], Jhonnie Garner, MD Harlan Stains, MD

## 2011-01-17 ENCOUNTER — Ambulatory Visit
Admission: RE | Admit: 2011-01-17 | Discharge: 2011-01-17 | Disposition: A | Discharge status: Home / Self Care | Payer: BC Managed Care – PPO | Source: Ambulatory Visit | Attending: Radiation Oncology | Admitting: Radiation Oncology

## 2011-01-17 DIAGNOSIS — C50919 Malignant neoplasm of unspecified site of unspecified female breast: Secondary | ICD-10-CM

## 2011-01-21 ENCOUNTER — Ambulatory Visit
Admission: RE | Admit: 2011-01-21 | Discharge: 2011-01-21 | Disposition: A | Discharge status: Home / Self Care | Payer: BC Managed Care – PPO | Source: Ambulatory Visit | Attending: Radiation Oncology | Admitting: Radiation Oncology

## 2011-01-21 DIAGNOSIS — C50919 Malignant neoplasm of unspecified site of unspecified female breast: Secondary | ICD-10-CM

## 2011-01-24 ENCOUNTER — Ambulatory Visit
Admission: RE | Admit: 2011-01-24 | Discharge: 2011-01-24 | Disposition: A | Discharge status: Home / Self Care | Payer: BC Managed Care – PPO | Source: Ambulatory Visit | Attending: Radiation Oncology | Admitting: Radiation Oncology

## 2011-01-25 ENCOUNTER — Ambulatory Visit
Admission: RE | Admit: 2011-01-25 | Discharge: 2011-01-25 | Disposition: A | Discharge status: Home / Self Care | Payer: BC Managed Care – PPO | Source: Ambulatory Visit | Attending: Radiation Oncology | Admitting: Radiation Oncology

## 2011-01-25 DIAGNOSIS — C50919 Malignant neoplasm of unspecified site of unspecified female breast: Secondary | ICD-10-CM

## 2011-01-25 MED ORDER — RADIAPLEXRX EX GEL
Freq: Once | CUTANEOUS | Status: AC
  Administered 2011-01-25: 09:00:00 via TOPICAL

## 2011-01-26 ENCOUNTER — Ambulatory Visit
Admission: RE | Admit: 2011-01-26 | Discharge: 2011-01-26 | Disposition: A | Discharge status: Home / Self Care | Payer: BC Managed Care – PPO | Source: Ambulatory Visit | Attending: Radiation Oncology | Admitting: Radiation Oncology

## 2011-01-27 ENCOUNTER — Ambulatory Visit
Admission: RE | Admit: 2011-01-27 | Discharge: 2011-01-27 | Disposition: A | Discharge status: Home / Self Care | Payer: BC Managed Care – PPO | Source: Ambulatory Visit | Attending: Radiation Oncology | Admitting: Radiation Oncology

## 2011-01-27 DIAGNOSIS — C50919 Malignant neoplasm of unspecified site of unspecified female breast: Secondary | ICD-10-CM

## 2011-01-27 NOTE — Progress Notes (Signed)
Pt states "a mole on my r neck/shoulder has gotten lighter". Advised her radiation can cause changes in freckles, moles. She will discuss w/dr.

## 2011-01-27 NOTE — Progress Notes (Signed)
[name] Radiation Oncology Weekly Treatment Note    Name: [name] Date: 01/27/2011 MRN: [id number] DOB: 1960-08-19  Status:outpatient    Current dose: 720  Current fraction:4  Planned dose:6040  Planned fraction:33   MEDICATIONS:Current outpatient prescriptions:acyclovir (ZOVIRAX) 400 MG tablet, Take 400 mg by mouth 2 (two) times daily.  , Disp: , Rfl: ;  citalopram (CELEXA) 20 MG tablet, Take 20 mg by mouth daily.  , Disp: , Rfl: ;  gabapentin (NEURONTIN) 300 MG capsule, Take 300 mg by mouth daily.  , Disp: , Rfl: ;  levothyroxine (SYNTHROID, LEVOTHROID) 75 MCG tablet, Take 75 mcg by mouth daily.  , Disp: , Rfl:  loratadine (CLARITIN) 10 MG tablet, Take 10 mg by mouth daily.  , Disp: , Rfl: ;  oxyCODONE-acetaminophen (PERCOCET) 5-325 MG per tablet, Take 1 tablet by mouth as needed.  , Disp: , Rfl:    ALLERGIES: Review of patient's allergies indicates no known allergies.     NARRATIVE: [name] was seen today for weekly treatment management. The chart was checked and port films images were reviewed. Pt doing well. A mole on shoulder has lightened slightly.  PHYSICAL EXAMINATION: weight is 185 lb 8 oz (84.142 kg).         ASSESSMENT: Patient tolerating treatments well.    PLAN: Continue treatment as planned.

## 2011-01-28 ENCOUNTER — Ambulatory Visit
Admission: RE | Admit: 2011-01-28 | Discharge: 2011-01-28 | Disposition: A | Discharge status: Home / Self Care | Payer: BC Managed Care – PPO | Source: Ambulatory Visit | Attending: Radiation Oncology | Admitting: Radiation Oncology

## 2011-01-29 ENCOUNTER — Ambulatory Visit
Admission: RE | Admit: 2011-01-29 | Discharge: 2011-01-29 | Disposition: A | Discharge status: Home / Self Care | Payer: BC Managed Care – PPO | Source: Ambulatory Visit | Attending: Radiation Oncology | Admitting: Radiation Oncology

## 2011-01-31 ENCOUNTER — Ambulatory Visit
Admission: RE | Admit: 2011-01-31 | Discharge: 2011-01-31 | Disposition: A | Discharge status: Home / Self Care | Payer: BC Managed Care – PPO | Source: Ambulatory Visit | Attending: Radiation Oncology | Admitting: Radiation Oncology

## 2011-02-01 ENCOUNTER — Ambulatory Visit
Admission: RE | Admit: 2011-02-01 | Discharge: 2011-02-01 | Disposition: A | Discharge status: Home / Self Care | Payer: BC Managed Care – PPO | Source: Ambulatory Visit | Attending: Radiation Oncology | Admitting: Radiation Oncology

## 2011-02-02 ENCOUNTER — Ambulatory Visit
Admission: RE | Admit: 2011-02-02 | Discharge: 2011-02-02 | Disposition: A | Discharge status: Home / Self Care | Payer: BC Managed Care – PPO | Source: Ambulatory Visit | Attending: Radiation Oncology | Admitting: Radiation Oncology

## 2011-02-02 VITALS — Wt 183.0 lb

## 2011-02-02 DIAGNOSIS — C50919 Malignant neoplasm of unspecified site of unspecified female breast: Secondary | ICD-10-CM

## 2011-02-02 DIAGNOSIS — C50911 Malignant neoplasm of unspecified site of right female breast: Secondary | ICD-10-CM | POA: Insufficient documentation

## 2011-02-02 NOTE — Progress Notes (Signed)
Redness at incisional scar only. Occ. Shooting pains which is normal.

## 2011-02-05 NOTE — Progress Notes (Signed)
[facility] Radiation [facility]cology Weekly Treatment Note    Name: RAYNE COWDREY Date: 02/05/2011 MRN: [id number] DOB: Oct 25, 1960  Status:outpatient    Current dose: 1620   Current fraction:9  Planned dose: 6040  Planned fraction:33   MEDICATIONS:Current outpatient prescriptions:acyclovir (ZOVIRAX) 400 MG tablet, Take 400 mg by mouth 2 (two) times daily.  , Disp: , Rfl: ;  citalopram (CELEXA) 20 MG tablet, Take 20 mg by mouth daily.  , Disp: , Rfl: ;  gabapentin (NEURONTIN) 300 MG capsule, Take 300 mg by mouth daily.  , Disp: , Rfl: ;  levothyroxine (SYNTHROID, LEVOTHROID) 75 MCG tablet, Take 75 mcg by mouth daily.  , Disp: , Rfl:  loratadine (CLARITIN) 10 MG tablet, Take 10 mg by mouth daily.  , Disp: , Rfl: ;  oxyCODONE-acetaminophen (PERCOCET) 5-325 MG per tablet, Take 1 tablet by mouth as needed.  , Disp: , Rfl:    ALLERGIES: Review of patient's allergies indicates no known allergies.    NARRATIVE: Eilyn K Chiles was seen today for weekly treatment management. The chart was checked and port films images were reviewed. Pt doing well. C/o shooting pains.  PHYSICAL EXAMINATION: weight is 183 lb (83.008 kg).     Minimal skin change - looks good   ASSESSMENT: Patient tolerating treatments well.    PLAN: Continue treatment as planned.

## 2011-02-07 ENCOUNTER — Ambulatory Visit
Admission: RE | Admit: 2011-02-07 | Discharge: 2011-02-07 | Disposition: A | Discharge status: Home / Self Care | Payer: BC Managed Care – PPO | Source: Ambulatory Visit | Attending: Radiation Oncology | Admitting: Radiation Oncology

## 2011-02-07 ENCOUNTER — Other Ambulatory Visit: Payer: Self-pay | Admitting: Radiation Oncology

## 2011-02-07 MED ORDER — CITALOPRAM HYDROBROMIDE 20 MG PO TABS: 20.0000 mg | ORAL_TABLET | Freq: Every day | ORAL | Status: DC

## 2011-02-07 MED ORDER — GABAPENTIN 300 MG PO CAPS: 300.0000 mg | ORAL_CAPSULE | Freq: Every day | ORAL | Status: DC

## 2011-02-08 ENCOUNTER — Ambulatory Visit
Admission: RE | Admit: 2011-02-08 | Discharge: 2011-02-08 | Disposition: A | Discharge status: Home / Self Care | Payer: BC Managed Care – PPO | Source: Ambulatory Visit | Attending: Radiation Oncology | Admitting: Radiation Oncology

## 2011-02-09 ENCOUNTER — Ambulatory Visit
Admission: RE | Admit: 2011-02-09 | Discharge: 2011-02-09 | Disposition: A | Discharge status: Home / Self Care | Payer: BC Managed Care – PPO | Source: Ambulatory Visit | Attending: Radiation Oncology | Admitting: Radiation Oncology

## 2011-02-10 ENCOUNTER — Ambulatory Visit
Admission: RE | Admit: 2011-02-10 | Discharge: 2011-02-10 | Disposition: A | Discharge status: Home / Self Care | Payer: BC Managed Care – PPO | Source: Ambulatory Visit | Attending: Radiation Oncology | Admitting: Radiation Oncology

## 2011-02-10 DIAGNOSIS — C50919 Malignant neoplasm of unspecified site of unspecified female breast: Secondary | ICD-10-CM

## 2011-02-10 NOTE — Progress Notes (Signed)
[facility] Radiation [facility]cology Weekly Treatment Note    Name: [name] Date: 02/10/2011 MRN: [id number] DOB: January 31, 1961  Status:outpatient    Current dose: 2340   Current fraction:13  Planned dose:6040  Planned fraction:33   MEDICATIONS: Current Outpatient Prescriptions  Medication Sig Dispense Refill  . acyclovir (ZOVIRAX) 400 MG tablet Take 400 mg by mouth 2 (two) times daily.        . citalopram (CELEXA) 20 MG tablet Take 1 tablet (20 mg total) by mouth daily.  30 tablet  1  . gabapentin (NEURONTIN) 300 MG capsule Take 1 capsule (300 mg total) by mouth daily.  30 capsule  1  . levothyroxine (SYNTHROID, LEVOTHROID) 75 MCG tablet Take 75 mcg by mouth daily.        Marland Kitchen loratadine (CLARITIN) 10 MG tablet Take 10 mg by mouth daily.        Marland Kitchen oxyCODONE-acetaminophen (PERCOCET) 5-325 MG per tablet Take 1 tablet by mouth as needed.           ALLERGIES: Review of patient's allergies indicates no known allergies.   LABORATORY DATA:  Lab Results  Component Value Date   WBC 5.9 12/27/2010   HGB 11.9* 12/27/2010   HCT 34.7* 12/27/2010   MCV 94.0 12/27/2010   PLT 208 12/27/2010   Lab Results  Component Value Date   NA 140 12/27/2010   K 3.6 12/27/2010   CL 105 12/27/2010   CO2 26 12/27/2010   Lab Results  Component Value Date   ALT 26 12/09/2010   AST 22 12/09/2010   ALKPHOS 84 12/09/2010   BILITOT 0.5 12/09/2010      NARRATIVE: [name] was seen today for weekly treatment management. The chart was checked and port films images were reviewed. Pt complains of fatigue, and some skin sensitivity. No major problems.  PHYSICAL EXAMINATION: weight is 183 lb (83.008 kg).      Skin looks good - some diffuse erythema.  ASSESSMENT: Patient tolerating treatments well.    PLAN: Continue treatment as planned.

## 2011-02-10 NOTE — Progress Notes (Signed)
Skin in tx area, dark pink, scar area darker. Nipple sensitive, slight fatigue.

## 2011-02-11 ENCOUNTER — Ambulatory Visit: Payer: BC Managed Care – PPO

## 2011-02-11 ENCOUNTER — Ambulatory Visit
Admission: RE | Admit: 2011-02-11 | Discharge: 2011-02-11 | Disposition: A | Discharge status: Home / Self Care | Payer: BC Managed Care – PPO | Source: Ambulatory Visit | Attending: Radiation Oncology | Admitting: Radiation Oncology

## 2011-02-12 DIAGNOSIS — Z923 Personal history of irradiation: Secondary | ICD-10-CM

## 2011-02-12 HISTORY — DX: Personal history of irradiation: Z92.3

## 2011-02-14 ENCOUNTER — Ambulatory Visit
Admission: RE | Admit: 2011-02-14 | Discharge: 2011-02-14 | Disposition: A | Discharge status: Home / Self Care | Payer: BC Managed Care – PPO | Source: Ambulatory Visit | Attending: Radiation Oncology | Admitting: Radiation Oncology

## 2011-02-15 ENCOUNTER — Ambulatory Visit
Admission: RE | Admit: 2011-02-15 | Discharge: 2011-02-15 | Disposition: A | Discharge status: Home / Self Care | Payer: BC Managed Care – PPO | Source: Ambulatory Visit | Attending: Radiation Oncology | Admitting: Radiation Oncology

## 2011-02-16 ENCOUNTER — Ambulatory Visit
Admission: RE | Admit: 2011-02-16 | Discharge: 2011-02-16 | Disposition: A | Discharge status: Home / Self Care | Payer: BC Managed Care – PPO | Source: Ambulatory Visit | Attending: Radiation Oncology | Admitting: Radiation Oncology

## 2011-02-17 ENCOUNTER — Ambulatory Visit
Admission: RE | Admit: 2011-02-17 | Discharge: 2011-02-17 | Disposition: A | Discharge status: Home / Self Care | Payer: BC Managed Care – PPO | Source: Ambulatory Visit | Attending: Radiation Oncology | Admitting: Radiation Oncology

## 2011-02-17 DIAGNOSIS — C50919 Malignant neoplasm of unspecified site of unspecified female breast: Secondary | ICD-10-CM

## 2011-02-17 MED ORDER — RADIAPLEXRX EX GEL
Freq: Two times a day (BID) | CUTANEOUS | Status: DC
  Administered 2011-02-17: 09:00:00 via TOPICAL

## 2011-02-17 NOTE — Progress Notes (Signed)
Has increased redness and itching  over clavicle, will try hydrocortisone 1%. Mild fatigue. Nipple sensitive to touch.will give additional tube of radiaplex.

## 2011-02-17 NOTE — Progress Notes (Signed)
Encounter addended by: Ardell Isaacs on: 02/17/2011  1:23 PM<BR>     Documentation filed: Inpatient MAR, Orders

## 2011-02-17 NOTE — Progress Notes (Signed)
[facility] Rad[facility]cology Weekly Treatment Note    Name: [name] Date: 02/17/2011 MRN: [id number] DOB: 1961-02-16  Status:outpatient    Current dose: 3240  Current fraction:18  Planned dose:6040  Planned fraction:33   MEDICATIONS: Current Outpatient Prescriptions  Medication Sig Dispense Refill  . acyclovir (ZOVIRAX) 400 MG tablet Take 400 mg by mouth 2 (two) times daily.        . citalopram (CELEXA) 20 MG tablet Take 1 tablet (20 mg total) by mouth daily.  30 tablet  1  . gabapentin (NEURONTIN) 300 MG capsule Take 1 capsule (300 mg total) by mouth daily.  30 capsule  1  . levothyroxine (SYNTHROID, LEVOTHROID) 75 MCG tablet Take 75 mcg by mouth daily.        Marland Kitchen loratadine (CLARITIN) 10 MG tablet Take 10 mg by mouth daily.        Marland Kitchen oxyCODONE-acetaminophen (PERCOCET) 5-325 MG per tablet Take 1 tablet by mouth as needed.           ALLERGIES: Review of patient's allergies indicates no known allergies.   LABORATORY DATA:  Lab Results  Component Value Date   WBC 5.9 12/27/2010   HGB 11.9* 12/27/2010   HCT 34.7* 12/27/2010   MCV 94.0 12/27/2010   PLT 208 12/27/2010   Lab Results  Component Value Date   NA 140 12/27/2010   K 3.6 12/27/2010   CL 105 12/27/2010   CO2 26 12/27/2010   Lab Results  Component Value Date   ALT 26 12/09/2010   AST 22 12/09/2010   ALKPHOS 84 12/09/2010   BILITOT 0.5 12/09/2010      NARRATIVE: [name] was seen today for weekly treatment management. The chart was checked and port films images were reviewed. Doing well. Some increased skin irritation, esp. In sclv area.  PHYSICAL EXAMINATION: vitals were not taken for this visit.    Patch of increased erythema/ desquamation in sclv, otherwise skin looks excellent  ASSESSMENT: Patient tolerating treatments well. Some increased skin irritation.   PLAN: Continue treatment as planned. Pt will use hydrocortisone as needed for itching.

## 2011-02-18 ENCOUNTER — Ambulatory Visit
Admission: RE | Admit: 2011-02-18 | Discharge: 2011-02-18 | Disposition: A | Discharge status: Home / Self Care | Payer: BC Managed Care – PPO | Source: Ambulatory Visit | Attending: Radiation Oncology | Admitting: Radiation Oncology

## 2011-02-21 ENCOUNTER — Ambulatory Visit
Admission: RE | Admit: 2011-02-21 | Discharge: 2011-02-21 | Disposition: A | Discharge status: Home / Self Care | Payer: BC Managed Care – PPO | Source: Ambulatory Visit | Attending: Radiation Oncology | Admitting: Radiation Oncology

## 2011-02-21 NOTE — Procedures (Signed)
DIAGNOSIS:  Breast cancer.  NARRATIVE:  [name] presented for port films prior to beginning her course of radiation for her diagnosis of breast cancer.  The isocenter is accurately placed and the blocks appropriately delineate the target treatment region.  Therefore, we will proceed with her treatment as planned.    ______________________________ Radene Gunning, M.D., Ph.D. JSM/MEDQ  D:  02/20/2011  T:  02/21/2011  Job:  [id number]

## 2011-02-22 ENCOUNTER — Ambulatory Visit
Admission: RE | Admit: 2011-02-22 | Discharge: 2011-02-22 | Disposition: A | Discharge status: Home / Self Care | Payer: BC Managed Care – PPO | Source: Ambulatory Visit | Attending: Radiation Oncology | Admitting: Radiation Oncology

## 2011-02-23 ENCOUNTER — Ambulatory Visit
Admission: RE | Admit: 2011-02-23 | Discharge: 2011-02-23 | Disposition: A | Discharge status: Home / Self Care | Payer: BC Managed Care – PPO | Source: Ambulatory Visit | Attending: Radiation Oncology | Admitting: Radiation Oncology

## 2011-02-24 ENCOUNTER — Ambulatory Visit
Admission: RE | Admit: 2011-02-24 | Discharge: 2011-02-24 | Disposition: A | Discharge status: Home / Self Care | Payer: BC Managed Care – PPO | Source: Ambulatory Visit | Attending: Radiation Oncology | Admitting: Radiation Oncology

## 2011-02-24 DIAGNOSIS — C50919 Malignant neoplasm of unspecified site of unspecified female breast: Secondary | ICD-10-CM

## 2011-02-24 NOTE — Progress Notes (Signed)
[facility] Radiation [facility]cology Weekly Treatment Note    Name: [name] Date: 02/24/2011 MRN: [id number] DOB: 1960/08/01  Status:outpatient    Current dose: 4140  Current fraction:23  Planned dose:6040  Planned fraction:33   MEDICATIONS: Current Outpatient Prescriptions  Medication Sig Dispense Refill  . acyclovir (ZOVIRAX) 400 MG tablet Take 400 mg by mouth 2 (two) times daily.        . citalopram (CELEXA) 20 MG tablet Take 1 tablet (20 mg total) by mouth daily.  30 tablet  1  . gabapentin (NEURONTIN) 300 MG capsule Take 1 capsule (300 mg total) by mouth daily.  30 capsule  1  . levothyroxine (SYNTHROID, LEVOTHROID) 75 MCG tablet Take 50 mcg by mouth daily.       Marland Kitchen loratadine (CLARITIN) 10 MG tablet Take 10 mg by mouth daily.        Marland Kitchen oxyCODONE-acetaminophen (PERCOCET) 5-325 MG per tablet Take 1 tablet by mouth as needed.           ALLERGIES: Review of patient's allergies indicates no known allergies.   LABORATORY DATA:  Lab Results  Component Value Date   WBC 5.9 12/27/2010   HGB 11.9* 12/27/2010   HCT 34.7* 12/27/2010   MCV 94.0 12/27/2010   PLT 208 12/27/2010   Lab Results  Component Value Date   NA 140 12/27/2010   K 3.6 12/27/2010   CL 105 12/27/2010   CO2 26 12/27/2010   Lab Results  Component Value Date   ALT 26 12/09/2010   AST 22 12/09/2010   ALKPHOS 84 12/09/2010   BILITOT 0.5 12/09/2010      NARRATIVE: [name] was seen today for weekly treatment management. The chart was checked and port films images were reviewed. Pt doing well with xrt. Some occasional headaches.  PHYSICAL EXAMINATION: weight is 185 lb (83.915 kg).  Skin looks OK - sclv greatest area of irritation, o/w no desquamation  ASSESSMENT: Patient tolerating treatments well.    PLAN: Continue treatment as planned.

## 2011-02-24 NOTE — Progress Notes (Signed)
Pt states beginning last Friday she noticed all-over headache after radiation tx. She took Advil 2 tabs w/relief. She states she had no headache over weekend but has had one daily after radiation tx since Monday, takes Advil w/relief. Pt has hx migraines, but states this is not migraine, is not related to sinus issues. Pt has no other problems, symptoms.  Informed pt will inform Dr Mitzi Hansen.

## 2011-02-25 ENCOUNTER — Ambulatory Visit
Admission: RE | Admit: 2011-02-25 | Discharge: 2011-02-25 | Disposition: A | Discharge status: Home / Self Care | Payer: BC Managed Care – PPO | Source: Ambulatory Visit | Attending: Radiation Oncology | Admitting: Radiation Oncology

## 2011-02-25 NOTE — Progress Notes (Signed)
DIAGNOSIS:  Invasive ductal carcinoma of the right breast.  NARRATIVE:  Ms. Crotteau presented for simulation for her upcoming course of radiation treatment to the right breast and right supraclavicular region.  The patient was placed in a supine position and a customized Quest board was fashioned for patient immobilization.  In addition, a customized AccuForm device was also constructed for patient immobilization.  This complex treatment device will be used on a daily basis during her treatment.  In this fashion, a CT scan was obtained through the area of interest and an isocenter was placed near the chest wall at the upper aspect of the chest.  Ms. Turcott will receive initially a course of radiation using a 4- field technique.  This will treat the patient to a dose of 50.4 Gy at 1.8 Gy per fraction in 28 fractions.  Therefore, 4 customized blocks have been designed and these correspond to tangent fields as well as a right supraclavicular field and a right posterior axillary boost field. A complex isodose plan is requested to ensure adequate target coverage and the patient's postoperative cavity has been contoured as 1 of these target structures.  It is anticipated that the patient will then receive a 10 Gy boost in 5 fractions at 2 Gy per fraction to yield a final total dose of 60.4 Gy.    ______________________________ Radene Gunning, M.D., Ph.D. JSM/MEDQ  D:  02/25/2011  T:  02/25/2011  Job:  [id number]

## 2011-02-28 ENCOUNTER — Ambulatory Visit
Admission: RE | Admit: 2011-02-28 | Discharge: 2011-02-28 | Disposition: A | Discharge status: Home / Self Care | Payer: BC Managed Care – PPO | Source: Ambulatory Visit | Attending: Radiation Oncology | Admitting: Radiation Oncology

## 2011-02-28 DIAGNOSIS — C50919 Malignant neoplasm of unspecified site of unspecified female breast: Secondary | ICD-10-CM

## 2011-02-28 MED ORDER — BIAFINE EX EMUL
Freq: Two times a day (BID) | CUTANEOUS | Status: DC
  Administered 2011-02-28: 10:00:00 via TOPICAL

## 2011-02-28 NOTE — Progress Notes (Signed)
Encounter addended by: Ardell Isaacs on: 02/28/2011 11:06 AM<BR>     Documentation filed: Notes Section

## 2011-02-28 NOTE — Progress Notes (Signed)
Patient presented to the clinic today following XRT reporting itching of the treatment site. Encouraged patient to use Hydrocortisone OTC on the affected. Also, provided patient with Biafine to use instead of Radiaplex. Reinforced skin care. Patient verbalized understanding. Patient denies need to see physician today. Encouraged patient to contact staff with needs. Patient verbalized understanding.

## 2011-02-28 NOTE — Progress Notes (Signed)
Encounter addended by: Ardell Isaacs on: 02/28/2011 11:11 AM<BR>     Documentation filed: Inpatient MAR

## 2011-03-01 ENCOUNTER — Ambulatory Visit
Admission: RE | Admit: 2011-03-01 | Discharge: 2011-03-01 | Disposition: A | Discharge status: Home / Self Care | Payer: BC Managed Care – PPO | Source: Ambulatory Visit | Attending: Radiation Oncology | Admitting: Radiation Oncology

## 2011-03-02 ENCOUNTER — Ambulatory Visit
Admission: RE | Admit: 2011-03-02 | Discharge: 2011-03-02 | Disposition: A | Discharge status: Home / Self Care | Payer: BC Managed Care – PPO | Source: Ambulatory Visit | Attending: Radiation Oncology | Admitting: Radiation Oncology

## 2011-03-02 ENCOUNTER — Ambulatory Visit: Payer: BC Managed Care – PPO | Admitting: Radiation Oncology

## 2011-03-02 DIAGNOSIS — C50919 Malignant neoplasm of unspecified site of unspecified female breast: Secondary | ICD-10-CM

## 2011-03-03 ENCOUNTER — Ambulatory Visit
Admission: RE | Admit: 2011-03-03 | Discharge: 2011-03-03 | Disposition: A | Discharge status: Home / Self Care | Payer: BC Managed Care – PPO | Source: Ambulatory Visit | Attending: Radiation Oncology | Admitting: Radiation Oncology

## 2011-03-03 DIAGNOSIS — C50919 Malignant neoplasm of unspecified site of unspecified female breast: Secondary | ICD-10-CM

## 2011-03-03 NOTE — Progress Notes (Signed)
Right clavicle area with increased redness. Will start 1 of 5 radiation treatments to right breast. Knows to use neosporin if there is break in skin.

## 2011-03-04 ENCOUNTER — Ambulatory Visit
Admission: RE | Admit: 2011-03-04 | Discharge: 2011-03-04 | Disposition: A | Discharge status: Home / Self Care | Payer: BC Managed Care – PPO | Source: Ambulatory Visit | Attending: Radiation Oncology | Admitting: Radiation Oncology

## 2011-03-04 NOTE — Progress Notes (Signed)
[facility] Radiation Oncology Weekly Treatment Note    Name: PEYTAN ANDRINGA Date: 03/04/2011 MRN: [id number] DOB: [birth year]  Status: outpatient    Current dose: 5040  Current fraction:28  Planned dose:6040  Planned fraction:33   MEDICATIONS: Current Outpatient Prescriptions  Medication Sig Dispense Refill  . benzonatate (TESSALON) 100 MG capsule Take 100 mg by mouth 3 (three) times daily as needed.        Marland Kitchen acyclovir (ZOVIRAX) 400 MG tablet Take 400 mg by mouth 2 (two) times daily.        . citalopram (CELEXA) 20 MG tablet Take 1 tablet (20 mg total) by mouth daily.  30 tablet  1  . gabapentin (NEURONTIN) 300 MG capsule Take 1 capsule (300 mg total) by mouth daily.  30 capsule  1  . levothyroxine (SYNTHROID, LEVOTHROID) 75 MCG tablet Take 50 mcg by mouth daily.       Marland Kitchen loratadine (CLARITIN) 10 MG tablet Take 10 mg by mouth daily.        Marland Kitchen oxyCODONE-acetaminophen (PERCOCET) 5-325 MG per tablet Take 1 tablet by mouth as needed.           ALLERGIES: Review of patient's allergies indicates no known allergies.   LABORATORY DATA:  Lab Results  Component Value Date   WBC 5.9 12/27/2010   HGB 11.9* 12/27/2010   HCT 34.7* 12/27/2010   MCV 94.0 12/27/2010   PLT 208 12/27/2010   Lab Results  Component Value Date   NA 140 12/27/2010   K 3.6 12/27/2010   CL 105 12/27/2010   CO2 26 12/27/2010   Lab Results  Component Value Date   ALT 26 12/09/2010   AST 22 12/09/2010   ALKPHOS 84 12/09/2010   BILITOT 0.5 12/09/2010      NARRATIVE: Donnette K Kohan was seen today for weekly treatment management. The chart was checked and port films images were reviewed. Pt doing well. Moderate skin changes - most in sclv. Some itching in sclv.  PHYSICAL EXAMINATION: vitals were not taken for this visit.   Overall skin loos good; no moist desquamation in sclv  ASSESSMENT: Patient tolerating treatments well.    PLAN: Continue treatment as planned. Will begin boost  now.

## 2011-03-07 ENCOUNTER — Ambulatory Visit
Admission: RE | Admit: 2011-03-07 | Discharge: 2011-03-07 | Disposition: A | Discharge status: Home / Self Care | Payer: BC Managed Care – PPO | Source: Ambulatory Visit | Attending: Radiation Oncology | Admitting: Radiation Oncology

## 2011-03-09 ENCOUNTER — Ambulatory Visit
Admission: RE | Admit: 2011-03-09 | Discharge: 2011-03-09 | Disposition: A | Discharge status: Home / Self Care | Payer: BC Managed Care – PPO | Source: Ambulatory Visit | Attending: Radiation Oncology | Admitting: Radiation Oncology

## 2011-03-10 ENCOUNTER — Ambulatory Visit: Admission: RE | Admit: 2011-03-10 | Payer: BC Managed Care – PPO | Source: Ambulatory Visit

## 2011-03-10 ENCOUNTER — Ambulatory Visit
Admission: RE | Admit: 2011-03-10 | Discharge: 2011-03-10 | Disposition: A | Discharge status: Home / Self Care | Payer: BC Managed Care – PPO | Source: Ambulatory Visit | Attending: Radiation Oncology | Admitting: Radiation Oncology

## 2011-03-11 ENCOUNTER — Ambulatory Visit
Admission: RE | Admit: 2011-03-11 | Discharge: 2011-03-11 | Disposition: A | Discharge status: Home / Self Care | Payer: BC Managed Care – PPO | Source: Ambulatory Visit | Attending: Radiation Oncology | Admitting: Radiation Oncology

## 2011-03-11 ENCOUNTER — Encounter: Payer: Self-pay | Admitting: Radiation Oncology

## 2011-03-11 VITALS — Wt 181.8 lb

## 2011-03-11 DIAGNOSIS — C50919 Malignant neoplasm of unspecified site of unspecified female breast: Secondary | ICD-10-CM

## 2011-03-11 NOTE — Progress Notes (Signed)
  Radiation Oncology         [phone] ________________________________  Name: [name] MRN: [id number]  Date: 03/11/2011  DOB: [birth year]  Weekly Radiation Therapy Management  Current Dose: 60.4 Gy     Planned Dose:  60.4 Gy  Narrative . . . . . . . . The patient presents for her final weekly treatment assessment.                                   The patient is without complaint.                                 Set-up films were reviewed.                                 The chart was checked. Physical Findings. . . Weight essentially stable.  She has a few areas of moist desquamation over the right clavicle and inframammary fold. There is no evidence of superinfection.  Impression . . . . . . . The patient is  tolerating radiation. Plan . . . . . . . . . . . Marland Kitchen  complete  treatment as planned.The patient will return for routine followup in one month   ________________________________  Artist Pais. Kathrynn Running, M.D.

## 2011-03-11 NOTE — Progress Notes (Signed)
NOTED BRIGHT RED SKIN AT CLAVICULAR AREA AND UNDER BREAST, TINY AREA OF DESQUAMATION UNDER BREAST, USING NEOSPORIN ON THIS AREA AND BIAFINE ON OTHER

## 2011-04-06 NOTE — Progress Notes (Signed)
CC:   Almond Lint, MD Lowella Dell, M.D. Freddy Finner, M.D.  DIAGNOSIS:  Invasive ductal carcinoma of the right breast.  INDICATION FOR THERAPY:  Curative.  TREATMENT DATES:  01/24/2011 to 03/11/2011.  SITE AND DOSE:  [name] was treated initially with a 4-field technique to the right breast and right supraclavicular region.  This consisted of medial and lateral tangent fields as well as a left anterior oblique field and a right posterior oblique field.  Both 6 and 10 MV photons were used for the parent beams and a forward planning technique was also use with respect to treatment of the right breast area.  The patient then received a 10 Gy boost in 5 fractions using a single en face electron field that used 15 MeV electrons.  The patient's final total dose was 60.4 Gy.  NARRATIVE:  Ms. Catalfamo did satisfactorily during the course of her treatment.  She did experience some skin irritation, which was expected, but this was doing quite well at the end of treatment.  FOLLOWUP APPOINTMENT:  1 month.    ______________________________ Radene Gunning, M.D., Ph.D. JSM/MEDQ  D:  04/06/2011  T:  04/06/2011  Job:  [id number]

## 2011-04-06 NOTE — Procedures (Signed)
DIAGNOSIS:  Breast cancer.  NARRATIVE:  Ms. Selke has undergone simulation for her upcoming boost treatment.  She initially has been planned to receive 50.4 Gy to the right breast and right supraclavicular region. The patient will now receive a right breast boost for an additional 10 Gy to yield a final total dose of 60.4 Gy.  The target seroma cavity has been contoured, and based on the depth of this, 15 MeV electrons will be used.  This corresponds to an en face electron treatment.  Therefore, a single additional customized block has been designed for this purpose.  A special port plan is requested.    ______________________________ Radene Gunning, M.D., Ph.D. JSM/MEDQ  D:  04/06/2011  T:  04/06/2011  Job:  [id number]

## 2011-04-13 ENCOUNTER — Encounter: Payer: Self-pay | Admitting: Radiation Oncology

## 2011-04-13 DIAGNOSIS — E079 Disorder of thyroid, unspecified: Secondary | ICD-10-CM | POA: Insufficient documentation

## 2011-04-13 DIAGNOSIS — C801 Malignant (primary) neoplasm, unspecified: Secondary | ICD-10-CM | POA: Insufficient documentation

## 2011-04-15 ENCOUNTER — Encounter: Payer: Self-pay | Admitting: Radiation Oncology

## 2011-04-15 ENCOUNTER — Ambulatory Visit
Admission: RE | Admit: 2011-04-15 | Discharge: 2011-04-15 | Disposition: A | Discharge status: Home / Self Care | Payer: BC Managed Care – PPO | Source: Ambulatory Visit | Attending: Radiation Oncology | Admitting: Radiation Oncology

## 2011-04-15 VITALS — BP 134/86 | HR 79 | Temp 98.6°F | Resp 20 | Ht 64.0 in | Wt 186.0 lb

## 2011-04-15 DIAGNOSIS — C50919 Malignant neoplasm of unspecified site of unspecified female breast: Secondary | ICD-10-CM

## 2011-04-15 NOTE — Progress Notes (Signed)
CC:   Lowella Dell, M.D. Almond Lint, MD W. Varney Baas, M.D.  DIAGNOSIS:  Invasive ductal carcinoma of the right breast, pT1c pN1a.  INTERVAL HISTORY:  Ms. Noori returns to clinic today for followup having completed her course of adjuvant radiotherapy on 03/11/2011.  She states that she has done well since that time.  She notes that her energy level is quite good and she is pleased with how her skin is healing at this point.  She has begun antihormonal treatment through Dr. Darnelle Catalan as of the beginning of the year.  PHYSICAL EXAMINATION:  Weight 186 pounds.  Blood pressure 134/86, pulse 79, temperature 98.6, respiratory rate 18.  The patient's skin exhibits some hyperpigmentation diffusely in the treatment area, especially in the inframammary region, the upper axilla and in the lower right supraclavicular region.  No ongoing desquamation in these areas and overall her skin looks good and is healing well.  IMPRESSION AND PLAN:  Ms. Earlywine is doing well approximately 1 month out from her course of adjuvant radiotherapy.  She has begun hormonal treatment and we will have her return to our clinic on a p.r.n. basis.    ______________________________ Radene Gunning, M.D., Ph.D. JSM/MEDQ  D:  04/15/2011  T:  04/15/2011  Job:  [id number]

## 2011-04-15 NOTE — Progress Notes (Signed)
Pt reports occass "tightness in R axilla", no pain.

## 2011-05-11 ENCOUNTER — Ambulatory Visit (HOSPITAL_BASED_OUTPATIENT_CLINIC_OR_DEPARTMENT_OTHER): Payer: BC Managed Care – PPO | Admitting: Oncology

## 2011-05-11 ENCOUNTER — Telehealth: Payer: Self-pay | Admitting: *Deleted

## 2011-05-11 ENCOUNTER — Other Ambulatory Visit: Payer: BC Managed Care – PPO | Admitting: Lab

## 2011-05-11 VITALS — BP 124/86 | HR 83 | Temp 98.9°F | Ht 64.0 in | Wt 187.6 lb

## 2011-05-11 DIAGNOSIS — M25559 Pain in unspecified hip: Secondary | ICD-10-CM

## 2011-05-11 DIAGNOSIS — Z170 Estrogen receptor positive status [ER+]: Secondary | ICD-10-CM

## 2011-05-11 DIAGNOSIS — C50919 Malignant neoplasm of unspecified site of unspecified female breast: Secondary | ICD-10-CM

## 2011-05-11 DIAGNOSIS — Z79811 Long term (current) use of aromatase inhibitors: Secondary | ICD-10-CM

## 2011-05-11 LAB — CBC WITH DIFFERENTIAL/PLATELET
Eosinophils Absolute: 0.1 10*3/uL (ref 0.0–0.5)
MONO#: 0.7 10*3/uL (ref 0.1–0.9)
NEUT#: 5.3 10*3/uL (ref 1.5–6.5)
RBC: 4.46 10*6/uL (ref 3.70–5.45)
RDW: 14.7 % — ABNORMAL HIGH (ref 11.2–14.5)
WBC: 7.5 10*3/uL (ref 3.9–10.3)

## 2011-05-11 NOTE — Telephone Encounter (Signed)
gave patient appointment for 07-2011 printed out calendar and gave to the patient 

## 2011-05-11 NOTE — Progress Notes (Signed)
ID: Catie K Chiao   DOB: 1960/07/06  MR#: [id number]  XBM#:[id number]  HISTORY OF PRESENT ILLNESS: Ms. Esteban had screening mammography June 09, 2010 at the breast center.  This suggested a possible abnormality in the right breast.  She was brought back for additional views on April 4.  Dr. Guinevere Ferrari was able to palpate a firm mobile mass at the 9 o'clock position 7 cm from the nipple.  Compression views confirmed an ill-defined mass with punctate and linear calcifications.  Ultrasound found this to be hypoechoic and to measure 1 cm.  In addition there was a lymph node with a diffusely thickened cortex measuring 1.5 cm in the right axilla.  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  With this information the patient was set up for breast MRI on April 13.  This showed in the posterior third of the upper outer quadrant of the right breast an irregular mass measuring 1.8 cm.  There was no multifocal or multicentric disease and there were at least two right axillary lymph nodes with thickened cortices.  There was no left axillary or left breast mass, no internal mammary chain adenopathy.  INTERVAL HISTORY: She returns today for followup of her breast cancer. Kaleena completed her radiation treatments in December. She started her letrozole on January 1. She is tolerating it quite well, with no significant hot flashes, no vaginal dryness, and no other symptoms that she is aware of.  REVIEW OF SYSTEMS: She is having some right hip discomfort. This is long-standing, possibly a little bit worse now, but she does a lot of hill climbing because she and her husband live in [place]. In  addition she has discomfort in the right axilla. This seemed to improve during the radiation but now it's bothering her a little bit more and she was doing well and gabapentin at night and would like to go back to that. 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 She has minimal allergy symptoms, feels a little bit forgetful but that is better, has a history of carpal tunnel which can come and go and the toes in the right foot occasionally feel numb. A detailed review of systems was otherwise noncontributory  PAST MEDICAL HISTORY: Past Medical History  Diagnosis Date  . Cancer     breast ca  . Thyroid disease   . History of radiation therapy 02/2011    R breast    PAST SURGICAL HISTORY: Past Surgical History  Procedure Date  . Breast surgery 07/08/2010    lumpectomy - right  . Lymph node removal 07/08/2010    right side  . Port-a-cath removal 12/28/2010    power port per patient  . Orthognathic sx 1991    due to accident as a child. sx to straighten dental bite.    FAMILY HISTORY Family History  Problem Relation Age of Onset  . Cancer Maternal Aunt     colon - great maternal aunt  . Cancer Paternal Aunt     ovarian  . Cancer Maternal Aunt  breast  The patient's father is alive at age 65.  Patient's mother is alive at age 61.  She has no sisters, does have one brother.  There is no history or breast or ovarian cancer in the immediate family although she has a maternal aunt who was diagnosed with breast cancer before the age of 34 and a paternal aunt who was diagnosed with ovarian cancer before the age of 85.  GYNECOLOGIC HISTORY: Menarche age 27, menopause about five to seven years ago.  She has been on hormone replacement for various periods most recently started estradiol June 10, 2010.  She never had any complications from any of the hormones she took  and she has a Mirena IUD in place.  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 They have two daughters "Francesco Runner" Natassia Guthridge, 23, who works in [place], but lives at home and Jill Alexanders [place], [name], who is a Holiday representative and is planning to go to college next year.   ADVANCED DIRECTIVES:  HEALTH MAINTENANCE: History  Substance Use Topics  . Smoking status: Never Smoker   . Smokeless tobacco: Never Used  . Alcohol Use: No     Colonoscopy:  PAP:  Bone density:  Lipid panel:  No Known Allergies  Current Outpatient Prescriptions  Medication Sig Dispense Refill  . aspirin 81 MG tablet Take 81 mg by mouth daily.      Marland Kitchen atorvastatin (LIPITOR) 20 MG tablet Take 20 mg by mouth daily.      . citalopram (CELEXA) 20 MG tablet Take 1 tablet (20 mg total) by mouth daily.  30 tablet  1  . fish oil-omega-3 fatty acids 1000 MG capsule Take 1 g by mouth daily.      Marland Kitchen letrozole (FEMARA) 2.5 MG tablet Take 2.5 mg by mouth daily.      Marland Kitchen levothyroxine (SYNTHROID, LEVOTHROID) 75 MCG tablet Take 50 mcg by mouth daily.       Marland Kitchen loratadine (CLARITIN) 10 MG tablet Take 10 mg by mouth daily.        . Multiple Vitamin (MULTIVITAMIN) capsule Take 1 capsule by mouth daily.        OBJECTIVE: Middle-aged white woman with  new Manson Passey currently here Filed Vitals:   05/11/11 1412  BP: 124/86  Pulse: 83  Temp: 98.9 F (37.2 C)     Body mass index is 32.20 kg/(m^2).    ECOG FS: 0  Sclerae unicteric but slightly injected on the right Oropharynx clear No peripheral adenopathy Lungs no rales or rhonchi Heart regular rate and rhythm Abd benign MSK no focal spinal tenderness, no peripheral edema, no focal tenderness at the right hip area Neuro: nonfocal Breasts: The right breast is status post  lumpectomy and radiation. There is still some hyperpigmentation, but no skin changes, and certainly no findings suggestive of disease recurrence. The left breast is unremarkable  LAB RESULTS: Lab Results  Component Value Date   WBC 7.5 05/11/2011   NEUTROABS 5.3 05/11/2011   HGB 13.8 05/11/2011   HCT 40.0 05/11/2011   MCV 89.6 05/11/2011   PLT 181 05/11/2011      Chemistry      Component Value Date/Time   NA 140 12/27/2010 1507   K 3.6 12/27/2010 1507  CL 105 12/27/2010 1507   CO2 26 12/27/2010 1507   BUN 10 12/27/2010 1507   CREATININE 0.71 12/27/2010 1507      Component Value Date/Time   CALCIUM 9.6 12/27/2010 1507   ALKPHOS 84 12/09/2010 1322   AST 22 12/09/2010 1322   ALT 26 12/09/2010 1322   BILITOT 0.5 12/09/2010 1322       Lab Results  Component Value Date   LABCA2 19 06/30/2010    No results found for this basename: INR:1;PROTIME:1 in the last 168 hours  No results found for this basename: UACOL:1,UAPR:1,USPG:1,UPH:1,UTP:1,UGL:1,UKET:1,UBIL:1,UHGB:1,UNIT:1,UROB:1,ULEU:1,UEPI:1,UWBC:1,URBC:1,UBAC:1,CAST:1,CRYS:1,UCOM:1,BILUA:1 in the last 72 hours   STUDIES: No new results found.  ASSESSMENT: 51 year old [place] woman status post right lumpectomy and axillary lymph node dissection April 2012 for a T1c N1 M0, stage IIA invasive ductal carcinoma, grade 3, strongly estrogen and progesterone receptor positive, HER-2 negative with an MIB-1 of 26%, treated adjuvantly with 4 cycles of dose dense doxorubicin and cyclophosphamide followed by 9 weekly doses of paclitaxel followed by radiation completed December of 2012. She started letrozole 03/15/2011, with good tolerance   PLAN: The plan is to continue letrozole for 5 years. I wrote her for gabapentin to take at bedtime per her request. I suggested she use a splint on her left wrist and see if that relieves some of the carpal tunnel symptoms she is having. As far as the right hip discomfort, we talked about doing plain films in  referring to orthopedics, but she wants to wait on that at present. We can discuss that further at the next visit which will be in 3 months. She will have had repeat mammography before that   Sven Pinheiro C    05/11/2011

## 2011-05-12 ENCOUNTER — Telehealth: Payer: Self-pay | Admitting: Oncology

## 2011-05-12 LAB — COMPREHENSIVE METABOLIC PANEL
ALT: 35 U/L (ref 0–35)
Albumin: 4.8 g/dL (ref 3.5–5.2)
Alkaline Phosphatase: 94 U/L (ref 39–117)
CO2: 21 mEq/L (ref 19–32)
Glucose, Bld: 92 mg/dL (ref 70–99)
Potassium: 3.9 mEq/L (ref 3.5–5.3)
Sodium: 138 mEq/L (ref 135–145)
Total Protein: 6.8 g/dL (ref 6.0–8.3)

## 2011-05-12 NOTE — Telephone Encounter (Signed)
lmonvm of the pt regarding her bone density appt in April at [facility]

## 2011-05-13 ENCOUNTER — Other Ambulatory Visit: Payer: Self-pay | Admitting: Oncology

## 2011-05-13 DIAGNOSIS — Z9889 Other specified postprocedural states: Secondary | ICD-10-CM

## 2011-05-13 DIAGNOSIS — Z853 Personal history of malignant neoplasm of breast: Secondary | ICD-10-CM

## 2011-06-14 ENCOUNTER — Ambulatory Visit
Admission: RE | Admit: 2011-06-14 | Discharge: 2011-06-14 | Disposition: A | Discharge status: Home / Self Care | Payer: BC Managed Care – PPO | Source: Ambulatory Visit | Attending: Oncology | Admitting: Oncology

## 2011-06-14 DIAGNOSIS — C50919 Malignant neoplasm of unspecified site of unspecified female breast: Secondary | ICD-10-CM

## 2011-06-14 DIAGNOSIS — Z9889 Other specified postprocedural states: Secondary | ICD-10-CM

## 2011-06-14 DIAGNOSIS — Z853 Personal history of malignant neoplasm of breast: Secondary | ICD-10-CM

## 2011-07-05 ENCOUNTER — Encounter: Payer: Self-pay | Admitting: Oncology

## 2011-08-09 ENCOUNTER — Encounter: Payer: Self-pay | Admitting: Physician Assistant

## 2011-08-09 ENCOUNTER — Telehealth: Payer: Self-pay | Admitting: Oncology

## 2011-08-09 ENCOUNTER — Other Ambulatory Visit (HOSPITAL_BASED_OUTPATIENT_CLINIC_OR_DEPARTMENT_OTHER): Payer: BC Managed Care – PPO | Admitting: Lab

## 2011-08-09 ENCOUNTER — Ambulatory Visit (HOSPITAL_BASED_OUTPATIENT_CLINIC_OR_DEPARTMENT_OTHER): Payer: BC Managed Care – PPO | Admitting: Physician Assistant

## 2011-08-09 VITALS — BP 109/78 | HR 81 | Temp 98.8°F | Ht 64.0 in | Wt 175.4 lb

## 2011-08-09 DIAGNOSIS — C773 Secondary and unspecified malignant neoplasm of axilla and upper limb lymph nodes: Secondary | ICD-10-CM

## 2011-08-09 DIAGNOSIS — C50919 Malignant neoplasm of unspecified site of unspecified female breast: Secondary | ICD-10-CM

## 2011-08-09 DIAGNOSIS — M899 Disorder of bone, unspecified: Secondary | ICD-10-CM

## 2011-08-09 DIAGNOSIS — C50419 Malignant neoplasm of upper-outer quadrant of unspecified female breast: Secondary | ICD-10-CM

## 2011-08-09 DIAGNOSIS — F419 Anxiety disorder, unspecified: Secondary | ICD-10-CM

## 2011-08-09 DIAGNOSIS — M8580 Other specified disorders of bone density and structure, unspecified site: Secondary | ICD-10-CM | POA: Insufficient documentation

## 2011-08-09 DIAGNOSIS — Z170 Estrogen receptor positive status [ER+]: Secondary | ICD-10-CM

## 2011-08-09 HISTORY — DX: Other specified disorders of bone density and structure, unspecified site: M85.80

## 2011-08-09 LAB — CBC WITH DIFFERENTIAL/PLATELET
BASO%: 0.6 % (ref 0.0–2.0)
Basophils Absolute: 0 10*3/uL (ref 0.0–0.1)
HCT: 38.8 % (ref 34.8–46.6)
HGB: 13.4 g/dL (ref 11.6–15.9)
LYMPH%: 18.6 % (ref 14.0–49.7)
MCH: 31.6 pg (ref 25.1–34.0)
MCHC: 34.5 g/dL (ref 31.5–36.0)
MONO#: 0.6 10*3/uL (ref 0.1–0.9)
NEUT%: 64.5 % (ref 38.4–76.8)
Platelets: 156 10*3/uL (ref 145–400)
WBC: 4.6 10*3/uL (ref 3.9–10.3)

## 2011-08-09 MED ORDER — CITALOPRAM HYDROBROMIDE 20 MG PO TABS: 20.0000 mg | ORAL_TABLET | Freq: Every day | ORAL | Status: DC

## 2011-08-09 MED ORDER — ALENDRONATE SODIUM 35 MG PO TABS: 35.0000 mg | ORAL_TABLET | ORAL | Status: DC

## 2011-08-09 NOTE — Telephone Encounter (Signed)
gve the pt her aug,sept 2013 appt calendar °

## 2011-08-09 NOTE — Progress Notes (Signed)
ID: [name]   DOB: 1960/11/16  MR#: [id number]  FAO#:[id number]  HISTORY OF PRESENT ILLNESS: Ms. Kosel had screening mammography June 09, 2010 at the breast center.  This suggested a possible abnormality in the right breast.  She was brought back for additional views on April 4.  Dr. Guinevere Ferrari was able to palpate a firm mobile mass at the 9 o'clock position 7 cm from the nipple.  Compression views confirmed an ill-defined mass with punctate and linear calcifications.  Ultrasound found this to be hypoechoic and to measure 1 cm.  In addition there was a lymph node with a diffusely thickened cortex measuring 1.5 cm in the right axilla.  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  With this information the patient was set up for breast MRI on April 13.  This showed in the posterior third of the upper outer quadrant of the right breast an irregular mass measuring 1.8 cm.  There was no multifocal or multicentric disease and there were at least two right axillary lymph nodes with thickened cortices.  There was no left axillary or left breast mass, no internal mammary chain adenopathy.  INTERVAL HISTORY:  [name] returns today for followup of her right breast carcinoma. She continues on letrozole, 2.5 mg, which she began on 03/15/2011. She is tolerating the medication well, with no significant hot flashes. She continues on gabapentin at night.  She has had no increased joint pain, vaginal dryness, and no vaginal bleeding.  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 She is following a weight loss program for her primary care physician and  has lost about 12 pounds this past month. She is already feeling more energetic and "lighter".  REVIEW OF SYSTEMS:  The patient has had no recent illnesses and denies fevers, chills, or night sweats. No nausea or change in bowel habits. No chest pain or shortness of breath. No abnormal headaches or dizziness. No new or unusual myalgias, arthralgias or bony pain. The hip pain she complained of previously has resolved. She has no residual signs of peripheral neuropathy.  A detailed review of systems is otherwise noncontributory.  PAST MEDICAL HISTORY: Past Medical History  Diagnosis Date  . Cancer     breast ca  . Thyroid disease   . History of radiation therapy 02/2011    R breast  . Osteopenia 08/09/2011    PAST SURGICAL HISTORY: Past Surgical History  Procedure Date  . Breast surgery 07/08/2010    lumpectomy - right  . Lymph node removal 07/08/2010    right side  . Port-a-cath removal 12/28/2010    power port per patient  . Orthognathic sx 1991    due to accident as a child. sx to straighten dental bite.    FAMILY HISTORY Family History  Problem Relation Age of Onset  . Cancer Maternal Aunt     colon - great maternal aunt  . Cancer Paternal Aunt     ovarian  . Cancer Maternal Aunt     breast  The patient's father is alive at age 3.  Patient's mother is alive at age 89.  She has no sisters, does have one brother.  There is no history or breast or ovarian cancer in the immediate family although she has a maternal aunt who was diagnosed with breast cancer before the age of 79 and a paternal aunt who was diagnosed with ovarian cancer before the age of 43.  GYNECOLOGIC HISTORY: Menarche age 28, menopause about five to seven years ago.  She has been on hormone replacement for various periods most recently started estradiol June 10, 2010.  She never had any complications from any of the hormones she took and she has a Mirena IUD in place.  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 They have two daughters "Claire Runner" Aarica Barnes, 23, who works in [employer], but lives at home and [name], [name], who is a Holiday representative and is planning to go to college next year.   ADVANCED DIRECTIVES:  HEALTH MAINTENANCE: History  Substance Use Topics  . Smoking status: Never Smoker   . Smokeless tobacco: Never Used  . Alcohol Use: No     Colonoscopy:  PAP:  Bone density:  Osteopenia, May 2013  Lipid panel:  No Known Allergies  Current Outpatient Prescriptions  Medication Sig Dispense Refill  . alendronate (FOSAMAX) 35 MG tablet Take 1 tablet (35 mg total) by mouth every 7 (seven) days. Take with a full glass of water on an empty stomach.  4 tablet  3  . aspirin 81 MG tablet Take 81 mg by mouth daily.      Marland Kitchen atorvastatin (LIPITOR) 20 MG tablet Take 20 mg by mouth daily.      . citalopram (CELEXA) 20 MG tablet Take 1 tablet (20 mg total) by mouth daily.  90 tablet  3  . fish oil-omega-3 fatty acids 1000 MG capsule Take 1 g by mouth daily.      Marland Kitchen letrozole (FEMARA) 2.5 MG tablet Take 2.5 mg by mouth daily.      Marland Kitchen levothyroxine (SYNTHROID, LEVOTHROID) 75 MCG tablet Take 50 mcg by mouth daily.       Marland Kitchen loratadine (CLARITIN) 10 MG tablet Take 10 mg by mouth daily.        . Multiple Vitamin (MULTIVITAMIN) capsule Take 1 capsule by mouth daily.      Marland Kitchen DISCONTD: citalopram (CELEXA) 20 MG tablet Take 1 tablet (20 mg total) by mouth daily.  30 tablet  1    OBJECTIVE: Middle-aged white woman with  new Manson Passey currently here Filed Vitals:   08/09/11 0843  BP: 109/78  Pulse: 81  Temp: 98.8 F (37.1 C)     Body mass index is 30.11 kg/(m^2).    ECOG FS: 0  Filed Weights   08/09/11 0843  Weight: 175 lb 6.4 oz (79.561 kg)   Physical Exam: HEENT:  Sclerae  anicteric, conjunctivae pink.  Oropharynx clear.     Nodes:  No cervical, supraclavicular, or axillary lymphadenopathy palpated.  Breast Exam:  Right breast is status post lumpectomy and radiation. Well-healed incision. Some hyperpigmentation remaining status post radiation therapy. No suspicious nodularities or additional skin changes. No evidence of local recurrence. Left breast is benign no masses, skin changes, or nipple inversion. Lungs:  Clear to auscultation bilaterally.  No crackles, rhonchi, or wheezes.   Heart:  Regular rate and rhythm.   Abdomen:  Soft, nontender.  Positive bowel sounds.  No organomegaly or masses palpated.   Musculoskeletal:  No focal spinal tenderness to palpation.  Extremities:  Benign.  No peripheral edema or cyanosis.   Skin:  Benign.   Neuro:  Nonfocal. Alert and oriented x3.     LAB RESULTS: Lab Results  Component Value Date   WBC 4.6 08/09/2011   NEUTROABS 3.0 08/09/2011   HGB 13.4 08/09/2011   HCT 38.8 08/09/2011   MCV 91.5 08/09/2011   PLT 156 08/09/2011      Chemistry      Component Value Date/Time   NA 138 05/11/2011 1357   K 3.9 05/11/2011 1357   CL 103 05/11/2011 1357   CO2 21 05/11/2011 1357   BUN 13 05/11/2011 1357   CREATININE 0.81 05/11/2011 1357      Component Value Date/Time   CALCIUM 10.0 05/11/2011 1357   ALKPHOS 94 05/11/2011 1357   AST 31 05/11/2011 1357   ALT 35 05/11/2011 1357   BILITOT 1.0 05/11/2011 1357     CMET was repeated today, 08/09/2011, results pending.   Lab Results  Component Value Date   LABCA2 19 06/30/2010     STUDIES:  07/14/2011 DUAL X-RAY ABSORPTIOMETRY (DXA) FOR BONE MINERAL DENSITY  AP LUMBAR SPINE (L1 - L4)  Bone Mineral Density (BMD): 0.851 g/cm2  Young Adult T Score: -1.8  Z Score: -0.9  LEFT FEMUR (NECK)  Bone Mineral Density (BMD): 0.810 g/cm2  Young Adult T Score: -0.4  Z Score: 0.5  ASSESSMENT: Patient's diagnostic category is LOW BONE MASS by WHO  Criteria.  FRACTURE RISK: MODERATE  FRAX:  Based on the World Health Organization FRAX model, the 10  year probability of a major osteoporotic fracture is 3.9%. The 10  year probability of a hip fracture is 0.1%.  FRAX is most useful in patients with low hip BMD. The WHO  algorithm has not been validated for the use of spine BMD. Using  FRAX in patients with relatively normal BMD in the hip and low BMD  in the spine requires special clinical consideration as fracture  risk may be underestimated in these patients.  Comparison: None. Please note that it is not possible to compare  data from different instruments.    07/14/2011 DIGITAL DIAGNOSTIC BILATERAL MAMMOGRAM WITH CAD  Comparison: 06/09/2010, 04/16/2008, 03/12/2007  Findings: There are scattered fibroglandular densities. Right  lumpectomy and radiation therapy changes are present. There is no  dominant mass, nonsurgical architectural distortion or  calcification to suggest malignancy.  Mammographic images were processed with CAD.  IMPRESSION:  No mammographic evidence of malignancy. Yearly diagnostic  mammography is suggested.  BI-RADS CATEGORY 2: Benign finding(s).  Original Report Authenticated By: Daryl Eastern, M.D.    ASSESSMENT: 51 year old [place] woman   (1)  status post right lumpectomy and axillary lymph node dissection April 2012 for a T1c N1 M0, stage IIA invasive ductal carcinoma, grade 3, strongly estrogen and progesterone receptor positive, HER-2 negative with an MIB-1 of 26%,   (2)  treated adjuvantly with 4 cycles of dose dense doxorubicin and cyclophosphamide followed by 9 weekly doses of paclitaxel   (3)  radiation completed December of 2012.   (4)  She started letrozole 03/15/2011, with good tolerance   (5) Osteopenia  PLAN:  Metztli appears to be doing very well and there is no clinical evidence of disease recurrence. She will continue on the letrozole, the plan being to continue for  total of 5 years. She'll continue on the gabapentin at  night which seems to be helpful. She will continue on her Celexa which I have refilled today. We are also starting her on Fosamax, 35 mg weekly for prevention of osteoporosis. She will also be taking calcium as directed. We will also check her vitamin D level when she returns in 3 months.  We will continue to see her every 3 months, and she will see Dr. Darnelle Catalan in August. She knows to call prior that time with any changes or problems.  Redell Bhandari    08/09/2011

## 2011-08-10 LAB — COMPREHENSIVE METABOLIC PANEL
AST: 30 U/L (ref 0–37)
Albumin: 4.4 g/dL (ref 3.5–5.2)
BUN: 13 mg/dL (ref 6–23)
Calcium: 9.3 mg/dL (ref 8.4–10.5)
Chloride: 105 mEq/L (ref 96–112)
Creatinine, Ser: 0.72 mg/dL (ref 0.50–1.10)
Glucose, Bld: 74 mg/dL (ref 70–99)

## 2011-08-10 LAB — VITAMIN D 25 HYDROXY (VIT D DEFICIENCY, FRACTURES): Vit D, 25-Hydroxy: 53 ng/mL (ref 30–89)

## 2011-11-10 ENCOUNTER — Other Ambulatory Visit (HOSPITAL_BASED_OUTPATIENT_CLINIC_OR_DEPARTMENT_OTHER): Payer: BC Managed Care – PPO | Admitting: Lab

## 2011-11-10 DIAGNOSIS — M8580 Other specified disorders of bone density and structure, unspecified site: Secondary | ICD-10-CM

## 2011-11-10 DIAGNOSIS — C50919 Malignant neoplasm of unspecified site of unspecified female breast: Secondary | ICD-10-CM

## 2011-11-10 LAB — CBC WITH DIFFERENTIAL/PLATELET
BASO%: 0.5 % (ref 0.0–2.0)
Eosinophils Absolute: 0.2 10*3/uL (ref 0.0–0.5)
MCHC: 34.9 g/dL (ref 31.5–36.0)
MONO#: 0.5 10*3/uL (ref 0.1–0.9)
NEUT#: 3.6 10*3/uL (ref 1.5–6.5)
Platelets: 157 10*3/uL (ref 145–400)
RBC: 4.45 10*6/uL (ref 3.70–5.45)
RDW: 12.8 % (ref 11.2–14.5)
WBC: 5.5 10*3/uL (ref 3.9–10.3)
lymph#: 1.2 10*3/uL (ref 0.9–3.3)

## 2011-11-10 LAB — COMPREHENSIVE METABOLIC PANEL (CC13)
Albumin: 4 g/dL (ref 3.5–5.0)
Alkaline Phosphatase: 93 U/L (ref 40–150)
BUN: 13 mg/dL (ref 7.0–26.0)
CO2: 28 mEq/L (ref 22–29)
Chloride: 103 mEq/L (ref 98–107)
Potassium: 4 mEq/L (ref 3.5–5.1)
Sodium: 139 mEq/L (ref 136–145)
Total Bilirubin: 0.8 mg/dL (ref 0.20–1.20)

## 2011-11-11 LAB — VITAMIN D 25 HYDROXY (VIT D DEFICIENCY, FRACTURES): Vit D, 25-Hydroxy: 68 ng/mL (ref 30–89)

## 2011-11-11 LAB — CANCER ANTIGEN 27.29: CA 27.29: 26 U/mL (ref 0–39)

## 2011-11-17 ENCOUNTER — Ambulatory Visit (HOSPITAL_BASED_OUTPATIENT_CLINIC_OR_DEPARTMENT_OTHER): Payer: BC Managed Care – PPO | Admitting: Oncology

## 2011-11-17 ENCOUNTER — Telehealth: Payer: Self-pay | Admitting: *Deleted

## 2011-11-17 VITALS — BP 119/82 | HR 81 | Temp 97.9°F | Resp 20 | Ht 64.0 in | Wt 163.9 lb

## 2011-11-17 DIAGNOSIS — C50919 Malignant neoplasm of unspecified site of unspecified female breast: Secondary | ICD-10-CM

## 2011-11-17 DIAGNOSIS — C50419 Malignant neoplasm of upper-outer quadrant of unspecified female breast: Secondary | ICD-10-CM

## 2011-11-17 DIAGNOSIS — M8580 Other specified disorders of bone density and structure, unspecified site: Secondary | ICD-10-CM

## 2011-11-17 DIAGNOSIS — M949 Disorder of cartilage, unspecified: Secondary | ICD-10-CM

## 2011-11-17 DIAGNOSIS — C773 Secondary and unspecified malignant neoplasm of axilla and upper limb lymph nodes: Secondary | ICD-10-CM

## 2011-11-17 DIAGNOSIS — Z170 Estrogen receptor positive status [ER+]: Secondary | ICD-10-CM

## 2011-11-17 MED ORDER — ALENDRONATE SODIUM 35 MG PO TABS: 35.0000 mg | ORAL_TABLET | ORAL | Status: DC

## 2011-11-17 MED ORDER — LETROZOLE 2.5 MG PO TABS: 2.5000 mg | ORAL_TABLET | Freq: Every day | ORAL | Status: DC

## 2011-11-17 NOTE — Telephone Encounter (Signed)
GAVE PATIENT APPOINTMENT FOR 01-17-2012 AT [facility] 9:15AM  07-09-2012 LAB ONLY  07-17-2012 MD APPOINTMENT

## 2011-11-17 NOTE — Progress Notes (Signed)
ID: [name]   DOB: 06/06/1960  MR#: [id number]  WUJ#:[id number]  HISTORY OF PRESENT ILLNESS: Ms. Demo had screening mammography June 09, 2010 at the breast center.  This suggested a possible abnormality in the right breast.  She was brought back for additional views on April 4.  Dr. Guinevere Ferrari was able to palpate a firm mobile mass at the 9 o'clock position 7 cm from the nipple.  Compression views confirmed an ill-defined mass with punctate and linear calcifications.  Ultrasound found this to be hypoechoic and to measure 1 cm.  In addition there was a lymph node with a diffusely thickened cortex measuring 1.5 cm in the right axilla.  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  With this information the patient was set up for breast MRI on April 13.  This showed in the posterior third of the upper outer quadrant of the right breast an irregular mass measuring 1.8 cm.  There was no multifocal or multicentric disease and there were at least two right axillary lymph nodes with thickened cortices.  There was no left axillary or left breast mass, no internal mammary chain adenopathy. She proceeded to definitive therapy as detailed below.  INTERVAL HISTORY:  [name] returns today for followup of her right breast carcinoma. The interval history is unremarkable. She is doing a lot of surveying with her husband [name], as well as working with her chickens, taking care of the house, and the kids.  REVIEW OF SYSTEMS:  We reviewed how she is taking the Fosamax and she is having no side effects from that. She is tolerating the letrozole with no significant hot flashes or vaginal dryness  problems and she does not have any arthralgias or myalgias. Occasionally she has some leg cramps from going up and down [facility] with [place]. She is keeping herself well hydrated and she has gone on a no concentrated sweets/no "wipes" diet and "feels the better I have felt in years". She is occasionally slightly anxious. She has noted a slight difference in her urine stream, which now seems as if BX it were a little smaller. There has been no dysuria hematuria or frequency. A detailed review of systems was otherwise entirely negative.  PAST MEDICAL HISTORY: Past Medical History  Diagnosis Date  . Cancer     breast ca  . Thyroid disease   . History of radiation therapy 02/2011    R breast  . Osteopenia 08/09/2011    PAST SURGICAL HISTORY: Past Surgical History  Procedure Date  . Breast surgery 07/08/2010    lumpectomy - right  . Lymph node removal 07/08/2010    right side  . Port-a-cath removal 12/28/2010    power port per patient  . Orthognathic sx 1991    due to accident as a child. sx to straighten dental bite.    FAMILY HISTORY Family History  Problem Relation Age of Onset  . Cancer Maternal Aunt     colon - great maternal aunt  . Cancer Paternal Aunt     ovarian  . Cancer Maternal Aunt     breast  The patient's father is alive at age 68.  Patient's mother is alive at age 20.  She has no sisters, does have one brother.  There is no history or breast or ovarian cancer in the immediate family although she has a maternal aunt who was diagnosed with breast cancer before the age of 37 and a paternal aunt who was diagnosed with ovarian cancer before the age of 32.  GYNECOLOGIC HISTORY: Menarche age 75, menopause about five to seven years ago.  She has been on hormone replacement for various periods most recently started estradiol June 10, 2010.  She never had any complications from any of the hormones she took and she has a Mirena IUD in place.  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 They have two daughters "[name]" [name], 23, who works in [place], but lives at home and [name] [name], [name], who is a Holiday representative and is planning to go to college next year.   ADVANCED DIRECTIVES:  HEALTH MAINTENANCE: History  Substance Use Topics  . Smoking status: Never Smoker   . Smokeless tobacco: Never Used  . Alcohol Use: No     Colonoscopy:  PAP:  Bone density:  Osteopenia, May 2013  Lipid panel:  No Known Allergies  Current Outpatient Prescriptions  Medication Sig Dispense Refill  . alendronate (FOSAMAX) 35 MG tablet Take 1 tablet (35 mg total) by mouth every 7 (seven) days. Take with a full glass of water on an empty stomach.  4 tablet  3  . aspirin 81 MG tablet Take 81 mg by mouth daily.      Marland Kitchen atorvastatin (LIPITOR) 20 MG tablet Take 20 mg by mouth daily.      . citalopram (CELEXA) 20 MG tablet Take 1 tablet (20 mg total) by mouth daily.  90 tablet  3  . fish oil-omega-3 fatty acids 1000 MG capsule Take 1 g by mouth daily.      Marland Kitchen gabapentin (NEURONTIN) 300 MG capsule       . letrozole (FEMARA) 2.5 MG tablet Take 2.5 mg by mouth daily.      Marland Kitchen levothyroxine (SYNTHROID, LEVOTHROID) 75 MCG tablet Take 50 mcg by mouth daily.       Marland Kitchen loratadine (CLARITIN) 10 MG tablet Take 10 mg by mouth daily.        . Multiple Vitamin (MULTIVITAMIN) capsule Take 1 capsule by mouth daily.      . benzonatate (TESSALON) 100 MG capsule         OBJECTIVE: Middle-aged white woman who appears well Filed Vitals:   11/17/11 0931  BP: 119/82  Pulse: 81  Temp: 97.9 F (36.6 C)  Resp: 20     Body mass index is 28.13 kg/(m^2).    ECOG FS: 0  Filed Weights   11/17/11 0931  Weight: 163 lb 14.4 oz (74.345 kg)   Physical Exam: HEENT:  Sclerae anicteric.   Oropharynx clear.     Nodes:  No cervical, supraclavicular, or axillary lymphadenopathy palpated.  Breast Exam:  Right breast is status post lumpectomy and radiation. No evidence of local recurrence. Left breast is benign  Lungs:  Clear to auscultation bilaterally. Heart:  Regular rate and rhythm.   Abdomen:  Soft, nontender.  Positive bowel sounds.  No organomegaly or masses palpated.  Musculoskeletal:  No focal spinal tenderness to palpation.  Extremities:   No peripheral edema or cyanosis.   neuro:  Nonfocal. Alert and oriented x3.     LAB RESULTS: Lab Results  Component Value Date   WBC 5.5 11/10/2011   NEUTROABS 3.6 11/10/2011   HGB 13.8 11/10/2011   HCT 39.5 11/10/2011   MCV 88.8 11/10/2011   PLT 157 11/10/2011      Chemistry      Component Value Date/Time   NA 139 11/10/2011 0825   NA 141 08/09/2011 0825   K 4.0 11/10/2011 0825   K 3.6 08/09/2011 0825   CL 103 11/10/2011 0825   CL 105 08/09/2011 0825   CO2 28 11/10/2011 0825   CO2 27 08/09/2011 0825   BUN 13.0 11/10/2011 0825   BUN 13 08/09/2011 0825   CREATININE 0.8 11/10/2011 0825   CREATININE 0.72 08/09/2011 0825      Component Value Date/Time   CALCIUM 9.4 11/10/2011 0825   CALCIUM 9.3 08/09/2011 0825   ALKPHOS 93 11/10/2011 0825   ALKPHOS 98 08/09/2011 0825   AST 32 11/10/2011 0825   AST 30 08/09/2011 0825   ALT 55 11/10/2011 0825   ALT 45* 08/09/2011 0825   BILITOT 0.80 11/10/2011 0825   BILITOT 0.9 08/09/2011 0825     Vitamin D 25-hydroxy level was 65.   Lab Results  Component Value Date   LABCA2 26 11/10/2011     STUDIES: No results found.  ASSESSMENT: 51 year old [place] woman   (1)  status post right lumpectomy and axillary lymph node dissection April 2012 for a T1c N1 M0, stage IIA invasive ductal carcinoma, grade 3, strongly estrogen and progesterone receptor positive, HER-2 negative with an MIB-1 of 26%,   (2)  treated adjuvantly with 4 cycles of dose dense doxorubicin and cyclophosphamide followed by 9  weekly doses of paclitaxel   (3)  radiation completed December of 2012.   (4)  She started letrozole 03/15/2011, with good tolerance   (5) Osteopenia, started alendronate (Fosamax) May 2013  PLAN:  Amariz is doing terrific from a breast cancer point of view and I think at this point we can start broadening her followup. If she sees Dr. Donell Beers in November and Dr. Lloyd Huger in February, she can see [place] again in may, after her April mammogram. We are continuing the letrozole and alendronate (both were refilled today). The overall plan is to continue these 2 medications for a total of 5 years. She knows to call for any problems that may develop before the next visit.Marland Kitchen  MAGRINAT,GUSTAV C    11/17/2011

## 2011-12-26 ENCOUNTER — Ambulatory Visit (INDEPENDENT_AMBULATORY_CARE_PROVIDER_SITE_OTHER): Payer: BC Managed Care – PPO | Admitting: General Surgery

## 2012-01-17 ENCOUNTER — Encounter (INDEPENDENT_AMBULATORY_CARE_PROVIDER_SITE_OTHER): Payer: Self-pay | Admitting: General Surgery

## 2012-01-17 ENCOUNTER — Ambulatory Visit (INDEPENDENT_AMBULATORY_CARE_PROVIDER_SITE_OTHER): Payer: BC Managed Care – PPO | Admitting: General Surgery

## 2012-01-17 VITALS — BP 124/90 | HR 72 | Temp 97.5°F | Resp 18 | Ht 64.0 in | Wt 166.6 lb

## 2012-01-17 DIAGNOSIS — C50919 Malignant neoplasm of unspecified site of unspecified female breast: Secondary | ICD-10-CM

## 2012-01-17 NOTE — Assessment & Plan Note (Signed)
No clinical evidence of disease.    Follow up with me in 1 year.  Patient due for mammogram in late March early April.

## 2012-01-17 NOTE — Progress Notes (Signed)
HISTORY: Patient is a 51 year old female who is status post breast conservation therapy in April of 2012 for a pT1cN1 a right breast cancer.  She is doing well overall. She had her yearly mammogram and did not have any evidence of concerning lesions.  She has not palpated any masses. She does continue to have a little bit of numbness on the back of her right arm and on her right lateral chest. She has finished all of her chemotherapy and radiation. She is very pleased with her scars.   PERTINENT REVIEW OF SYSTEMS: Otherwise negative.     EXAM: Head: Normocephalic and atraumatic.  Eyes:  Conjunctivae are normal. Pupils are equal, round, and reactive to light. No scleral icterus.  Neck:  Normal range of motion. Neck supple. No tracheal deviation present. No thyromegaly present.  Breast:  No palpable lesions.  Dense breasts bilaterally.  Some firmness at scar.  No lymphadenopathy or nipple retraction.   Resp: No respiratory distress, normal effort. Abd:  Abdomen is soft, non distended and non tender. No masses are palpable.  There is no rebound and no guarding.  Neurological: Alert and oriented to person, place, and time. Coordination normal.  Skin: Skin is warm and dry. No rash noted. No diaphoretic. No erythema. No pallor.  Psychiatric: Normal mood and affect. Normal behavior. Judgment and thought content normal.      ASSESSMENT AND PLAN:   R breast cancer T1cN1a, BCT 07/08/2010 No clinical evidence of disease.    Follow up with me in 1 year.  Patient due for mammogram in late March early April.      Maudry Diego, MD Surgical Oncology, General & Endocrine Surgery [facility], Jhonnie Garner, MD Harlan Stains, MD

## 2012-02-06 ENCOUNTER — Other Ambulatory Visit: Payer: BC Managed Care – PPO | Admitting: Lab

## 2012-02-13 ENCOUNTER — Ambulatory Visit: Payer: BC Managed Care – PPO | Admitting: Physician Assistant

## 2012-07-06 ENCOUNTER — Other Ambulatory Visit: Payer: Self-pay | Admitting: *Deleted

## 2012-07-06 DIAGNOSIS — C50919 Malignant neoplasm of unspecified site of unspecified female breast: Secondary | ICD-10-CM

## 2012-07-09 ENCOUNTER — Other Ambulatory Visit: Payer: BC Managed Care – PPO | Admitting: Lab

## 2012-07-09 DIAGNOSIS — C50919 Malignant neoplasm of unspecified site of unspecified female breast: Secondary | ICD-10-CM

## 2012-07-09 LAB — CBC WITH DIFFERENTIAL/PLATELET
BASO%: 0.6 % (ref 0.0–2.0)
Basophils Absolute: 0 10*3/uL (ref 0.0–0.1)
EOS%: 3.6 % (ref 0.0–7.0)
Eosinophils Absolute: 0.2 10*3/uL (ref 0.0–0.5)
HCT: 39.7 % (ref 34.8–46.6)
HGB: 13.5 g/dL (ref 11.6–15.9)
LYMPH%: 23 % (ref 14.0–49.7)
MCH: 30.8 pg (ref 25.1–34.0)
MCHC: 34.1 g/dL (ref 31.5–36.0)
MCV: 90.2 fL (ref 79.5–101.0)
MONO#: 0.6 10*3/uL (ref 0.1–0.9)
MONO%: 11.3 % (ref 0.0–14.0)
NEUT#: 3.4 10*3/uL (ref 1.5–6.5)
NEUT%: 61.5 % (ref 38.4–76.8)
Platelets: 140 10*3/uL — ABNORMAL LOW (ref 145–400)
RBC: 4.4 10*6/uL (ref 3.70–5.45)
RDW: 13 % (ref 11.2–14.5)
WBC: 5.5 10*3/uL (ref 3.9–10.3)
lymph#: 1.3 10*3/uL (ref 0.9–3.3)

## 2012-07-09 LAB — COMPREHENSIVE METABOLIC PANEL (CC13)
ALT: 35 U/L (ref 0–55)
AST: 24 U/L (ref 5–34)
Albumin: 3.5 g/dL (ref 3.5–5.0)
Alkaline Phosphatase: 71 U/L (ref 40–150)
BUN: 11 mg/dL (ref 7.0–26.0)
CO2: 28 mEq/L (ref 22–29)
Calcium: 8.9 mg/dL (ref 8.4–10.4)
Chloride: 105 mEq/L (ref 98–107)
Creatinine: 0.8 mg/dL (ref 0.6–1.1)
Glucose: 93 mg/dl (ref 70–99)
Potassium: 3.9 mEq/L (ref 3.5–5.1)
Sodium: 141 mEq/L (ref 136–145)
Total Bilirubin: 0.75 mg/dL (ref 0.20–1.20)
Total Protein: 6.5 g/dL (ref 6.4–8.3)

## 2012-07-17 ENCOUNTER — Encounter: Payer: Self-pay | Admitting: Physician Assistant

## 2012-07-17 ENCOUNTER — Telehealth: Payer: Self-pay | Admitting: *Deleted

## 2012-07-17 ENCOUNTER — Other Ambulatory Visit: Payer: Self-pay | Admitting: *Deleted

## 2012-07-17 ENCOUNTER — Ambulatory Visit (HOSPITAL_BASED_OUTPATIENT_CLINIC_OR_DEPARTMENT_OTHER): Payer: BC Managed Care – PPO | Admitting: Physician Assistant

## 2012-07-17 VITALS — BP 128/85 | HR 70 | Temp 98.3°F | Resp 20 | Ht 64.0 in | Wt 166.3 lb

## 2012-07-17 DIAGNOSIS — Z853 Personal history of malignant neoplasm of breast: Secondary | ICD-10-CM

## 2012-07-17 DIAGNOSIS — F419 Anxiety disorder, unspecified: Secondary | ICD-10-CM

## 2012-07-17 DIAGNOSIS — Z5189 Encounter for other specified aftercare: Secondary | ICD-10-CM

## 2012-07-17 DIAGNOSIS — M899 Disorder of bone, unspecified: Secondary | ICD-10-CM

## 2012-07-17 DIAGNOSIS — F411 Generalized anxiety disorder: Secondary | ICD-10-CM

## 2012-07-17 DIAGNOSIS — C50919 Malignant neoplasm of unspecified site of unspecified female breast: Secondary | ICD-10-CM

## 2012-07-17 DIAGNOSIS — M8580 Other specified disorders of bone density and structure, unspecified site: Secondary | ICD-10-CM

## 2012-07-17 DIAGNOSIS — C50911 Malignant neoplasm of unspecified site of right female breast: Secondary | ICD-10-CM

## 2012-07-17 MED ORDER — ALENDRONATE SODIUM 35 MG PO TABS: 35.0000 mg | ORAL_TABLET | ORAL | Status: DC

## 2012-07-17 MED ORDER — LETROZOLE 2.5 MG PO TABS: 2.5000 mg | ORAL_TABLET | Freq: Every day | ORAL | Status: DC

## 2012-07-17 MED ORDER — CITALOPRAM HYDROBROMIDE 20 MG PO TABS: 20.0000 mg | ORAL_TABLET | Freq: Every day | ORAL | Status: DC

## 2012-07-17 MED ORDER — GABAPENTIN 300 MG PO CAPS: 300.0000 mg | ORAL_CAPSULE | Freq: Every day | ORAL | Status: DC

## 2012-07-17 NOTE — Progress Notes (Signed)
ID: [name]   DOB: Dec 03, 1960  MR#: [id number]  WUJ#:[id number]   PCP:  Claire Stains, MD GYN: SUAlmond Lint, MD OTHER:   Claire Puffer, MD  HISTORY OF PRESENT ILLNESS: [name] had screening mammography June 09, 2010 at the breast center.  This suggested a possible abnormality in the right breast.  She was brought back for additional views on April 4.  Dr. Guinevere Barnes was able to palpate a firm mobile mass at the 9 o'clock position 7 cm from the nipple.  Compression views confirmed an ill-defined mass with punctate and linear calcifications.  Ultrasound found this to be hypoechoic and to measure 1 cm.  In addition there was a lymph node with a diffusely thickened cortex measuring 1.5 cm in the right axilla.  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  With this information the patient was set up for breast MRI on April 13.  This showed in the posterior third of the upper outer quadrant of the right breast an irregular mass measuring 1.8 cm.  There was no multifocal or multicentric disease and there were at least two right axillary lymph nodes with thickened cortices.  There was no left axillary or left breast mass, no internal mammary chain adenopathy. She proceeded to definitive therapy as detailed below.  INTERVAL HISTORY:  [name] returns today accompanied by her daughter [name] for followup of her right breast carcinoma. She continues on letrozole which she is tolerating well. She recently had a case of sinusitis and has been on Ceftin as prescribed by her primary care physician. She is taking her gabapentin for couple days while taking the antibiotic, and had terrible  hot flashes. Otherwise, the hot flashes seem to be well-controlled. She continues on alendronate for her history of osteopenia. She denies any increased reflux symptoms, or esophageal discomfort. She's had no increased joint pain, and denies any vaginal dryness.  The interval history is unremarkable. 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  The family is doing well. 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   REVIEW OF SYSTEMS:  [name] has had no recent fevers or chills. She's had no skin changes, abnormal bruising, or abnormal bleeding. She's eating and drinking well denies any nausea or change in bowel habits. She does have some occasional urinary frequency, but no dysuria or hematuria. She denies any cough or increased shortness of breath has had no chest pain or palpitations. She denies any abnormal headaches or dizziness. She's had no unusual myalgias, arthralgias, bony pain, or peripheral swelling. She still has some residual numbness 3 of the toes on her right foot, but this is not affecting her day-to-day activities. She has no additional signs of neuropathy, other than some pre-existing carpal tunnel syndrome bilaterally.   A detailed review of systems was otherwise entirely negative.  PAST MEDICAL HISTORY: Past Medical History  Diagnosis Date  . Cancer     breast ca  . Thyroid disease   . History of radiation therapy 02/2011    R breast  . Osteopenia 08/09/2011    PAST SURGICAL HISTORY: Past  Surgical History  Procedure Laterality Date  . Breast surgery  07/08/2010    lumpectomy - right  . Lymph node removal  07/08/2010    right side  . Port-a-cath removal  12/28/2010    power port per patient  . Orthognathic sx  1991    due to accident as a child. sx to straighten dental bite.    FAMILY HISTORY Family History  Problem Relation Age of Onset  . Cancer Maternal Aunt     colon - great maternal aunt  . Cancer  Paternal Aunt     ovarian  . Cancer Maternal Aunt     breast  The patient's father is alive at age 67.  Patient's mother is alive at age 66.  She has no sisters, does have one brother.  There is no history or breast or ovarian cancer in the immediate family although she has a maternal aunt who was diagnosed with breast cancer before the age of 17 and a paternal aunt who was diagnosed with ovarian cancer before the age of 6.  GYNECOLOGIC HISTORY: Menarche age 56, menopause about five to seven years ago.  She has been on hormone replacement for various periods most recently started estradiol June 10, 2010.  She never had any complications from any of the hormones she took and she has a Mirena IUD in place.  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 They have two daughters "[name]" [name], 23, who works in [place], but lives at home and [name], [name], who is a Holiday representative and is planning to go to college next year.   ADVANCED DIRECTIVES:  HEALTH MAINTENANCE: History  Substance Use Topics  . Smoking status: Never Smoker   . Smokeless tobacco: Never Used  . Alcohol Use: No     Colonoscopy:  PAP:  Bone density:  Osteopenia, May 2013  Lipid panel:  No Known Allergies  Current Outpatient Prescriptions  Medication Sig Dispense Refill  . alendronate (FOSAMAX) 35 MG tablet Take 1 tablet (35 mg total) by mouth every 7 (seven) days. Take with a full glass of water on an empty stomach.  15 tablet  3  . aspirin 81 MG tablet Take 81 mg by mouth daily.      Marland Kitchen atorvastatin (LIPITOR) 20 MG tablet Take 20 mg by mouth daily.      . citalopram (CELEXA) 20 MG tablet Take 1 tablet (20 mg total) by mouth daily.  90 tablet  3  . fish oil-omega-3 fatty acids 1000 MG  capsule Take 1 g by mouth daily.      Marland Kitchen gabapentin (NEURONTIN) 300 MG capsule Take 1 capsule (300 mg total) by mouth at bedtime.  90 capsule  3  . letrozole (FEMARA) 2.5 MG tablet Take 1 tablet (2.5 mg total) by mouth daily.  90 tablet  12  . levothyroxine (SYNTHROID, LEVOTHROID) 75 MCG tablet Take 75 mcg by mouth daily.       Marland Kitchen loratadine (CLARITIN) 10 MG tablet Take 10 mg by mouth daily.        . Multiple Vitamin (MULTIVITAMIN) capsule Take 1 capsule by mouth daily.      . phentermine 37.5 MG capsule Take 37.5 mg by mouth every morning.  No current facility-administered medications for this visit.    OBJECTIVE: Middle-aged white woman who appears well Filed Vitals:   07/17/12 0848  BP: 128/85  Pulse: 70  Temp: 98.3 F (36.8 C)  Resp: 20     Body mass index is 28.53 kg/(m^2).    ECOG FS: 0  Filed Weights   07/17/12 0848  Weight: 166 lb 4.8 oz (75.433 kg)   Physical Exam: HEENT:  Sclerae anicteric.  Oropharynx clear.     Nodes:  No cervical, supraclavicular, or axillary lymphadenopathy palpated.  Breast Exam:  Right breast is status post lumpectomy and radiation. No evidence of local recurrence. Left breast is benign  Lungs:  Clear to auscultation bilaterally.  No wheezes or rhonchi Heart:  Regular rate and rhythm.   Abdomen:  Soft, nontender.  Positive bowel sounds.   Musculoskeletal:  No focal spinal tenderness to palpation.  Extremities:   No peripheral edema neuro:  Nonfocal. Well oriented with positive affect.     LAB RESULTS: Lab Results  Component Value Date   WBC 5.5 07/09/2012   NEUTROABS 3.4 07/09/2012   HGB 13.5 07/09/2012   HCT 39.7 07/09/2012   MCV 90.2 07/09/2012   PLT 140* 07/09/2012      Chemistry      Component Value Date/Time   NA 141 07/09/2012 0808   NA 141 08/09/2011 0825   K 3.9 07/09/2012 0808   K 3.6 08/09/2011 0825   CL 105 07/09/2012 0808   CL 105 08/09/2011 0825   CO2 28 07/09/2012 0808   CO2 27 08/09/2011 0825   BUN 11.0 07/09/2012 0808    BUN 13 08/09/2011 0825   CREATININE 0.8 07/09/2012 0808   CREATININE 0.72 08/09/2011 0825      Component Value Date/Time   CALCIUM 8.9 07/09/2012 0808   CALCIUM 9.3 08/09/2011 0825   ALKPHOS 71 07/09/2012 0808   ALKPHOS 98 08/09/2011 0825   AST 24 07/09/2012 0808   AST 30 08/09/2011 0825   ALT 35 07/09/2012 0808   ALT 45* 08/09/2011 0825   BILITOT 0.75 07/09/2012 0808   BILITOT 0.9 08/09/2011 0825       STUDIES:  Most recent bilateral mammogram on 06/14/2011 was unremarkable.   Most recent bone density 07/05/2011 showed osteopenia.     ASSESSMENT: 52 year old [place] woman   (1)  status post right lumpectomy and axillary lymph node dissection April 2012 for a T1c N1 M0, stage IIA invasive ductal carcinoma, grade 3, strongly estrogen and progesterone receptor positive, HER-2 negative with an MIB-1 of 26%,   (2)  treated adjuvantly with 4 cycles of dose dense doxorubicin and cyclophosphamide followed by 9 weekly doses of paclitaxel   (3)  radiation completed December of 2012.   (4)  She started letrozole 03/15/2011, with good tolerance   (5) Osteopenia, started alendronate (Fosamax) May 2013  PLAN:  Lilibeth continues to do very well from a breast cancer point of view.  She is tolerating the letrozole well, and we'll continue to 2.5 mg daily. The plan is to continue for a total of 5 years. She'll also continue on alendronate, as well as her gabapentin and citalopram. All of these were refill for her today.  Donnamae is due for her next mammogram and that is being scheduled for the next available appointment. We will see her again for followup in 6 months, and if she is still doing well, likely begin annual visits at that time. She voices understanding and agreement with this plan, and  will call with any changes or problems.   Izick Gasbarro    07/17/2012

## 2012-07-17 NOTE — Telephone Encounter (Signed)
appts made and printed...td 

## 2012-07-30 ENCOUNTER — Ambulatory Visit
Admission: RE | Admit: 2012-07-30 | Discharge: 2012-07-30 | Disposition: A | Discharge status: Home / Self Care | Payer: BC Managed Care – PPO | Source: Ambulatory Visit | Attending: Physician Assistant | Admitting: Physician Assistant

## 2012-07-30 DIAGNOSIS — Z853 Personal history of malignant neoplasm of breast: Secondary | ICD-10-CM

## 2012-08-15 ENCOUNTER — Encounter: Payer: Self-pay | Admitting: Internal Medicine

## 2012-08-22 ENCOUNTER — Ambulatory Visit (AMBULATORY_SURGERY_CENTER): Payer: BC Managed Care – PPO | Admitting: *Deleted

## 2012-08-22 VITALS — Ht 63.0 in | Wt 175.4 lb

## 2012-08-22 DIAGNOSIS — Z1211 Encounter for screening for malignant neoplasm of colon: Secondary | ICD-10-CM

## 2012-08-22 MED ORDER — NA SULFATE-K SULFATE-MG SULF 17.5-3.13-1.6 GM/177ML PO SOLN: ORAL | Status: DC

## 2012-08-23 ENCOUNTER — Encounter: Payer: Self-pay | Admitting: Internal Medicine

## 2012-09-05 ENCOUNTER — Encounter: Payer: BC Managed Care – PPO | Admitting: Internal Medicine

## 2012-10-23 IMAGING — US US BREAST R
1 series · 9 of 9 positions shown · non-contrast
Comparison: Multiple priors

CLINICAL DATA: Abnormal screening, right breast

DIGITAL DIAGNOSTIC RIGHT MAMMOGRAM WITHOUT CAD AND RIGHT BREAST
ULTRASOUND:

[Series 1: us breast right · 9 of 9 slices shown]
[im 1/9]
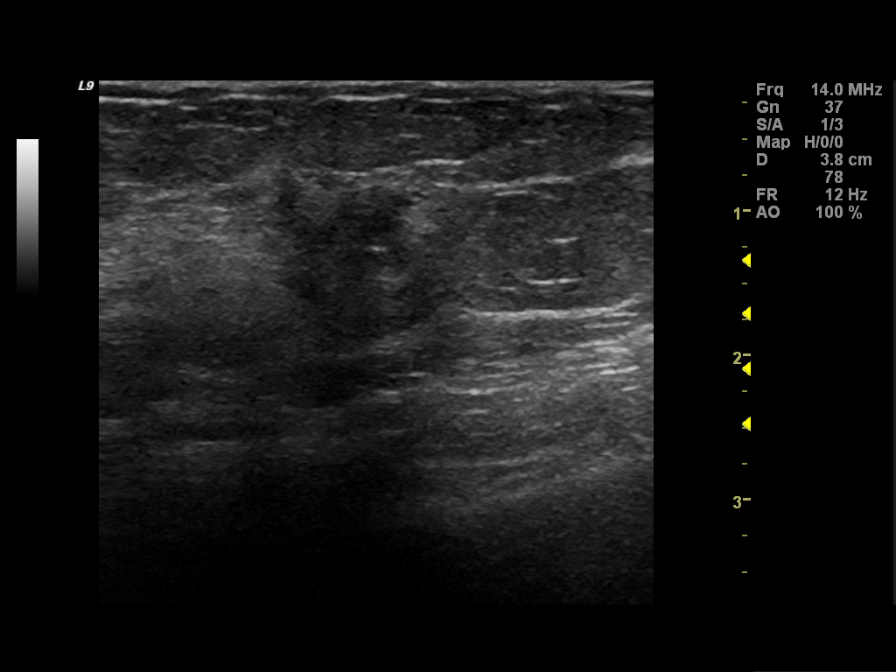
[im 2/9]
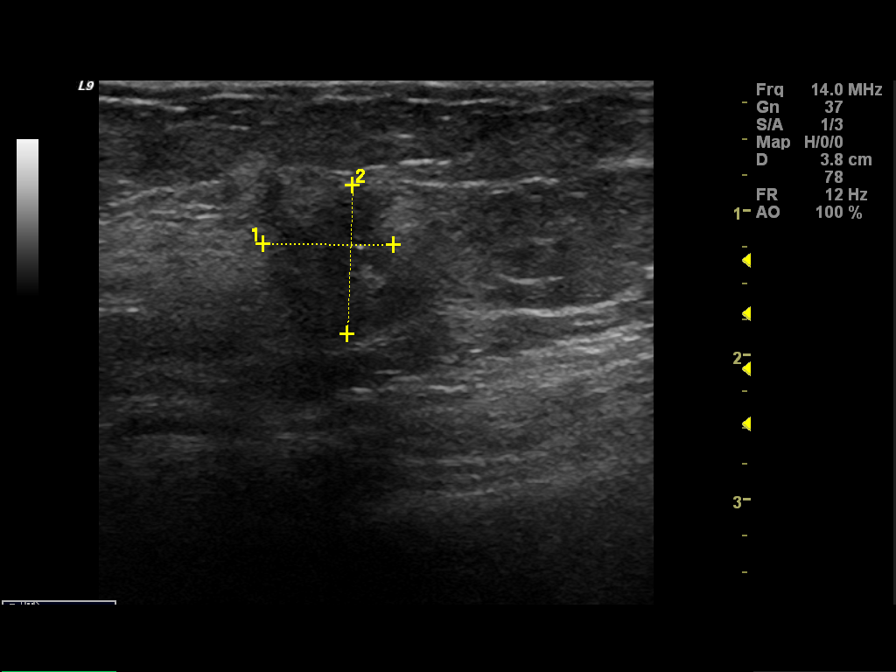
[im 3/9]
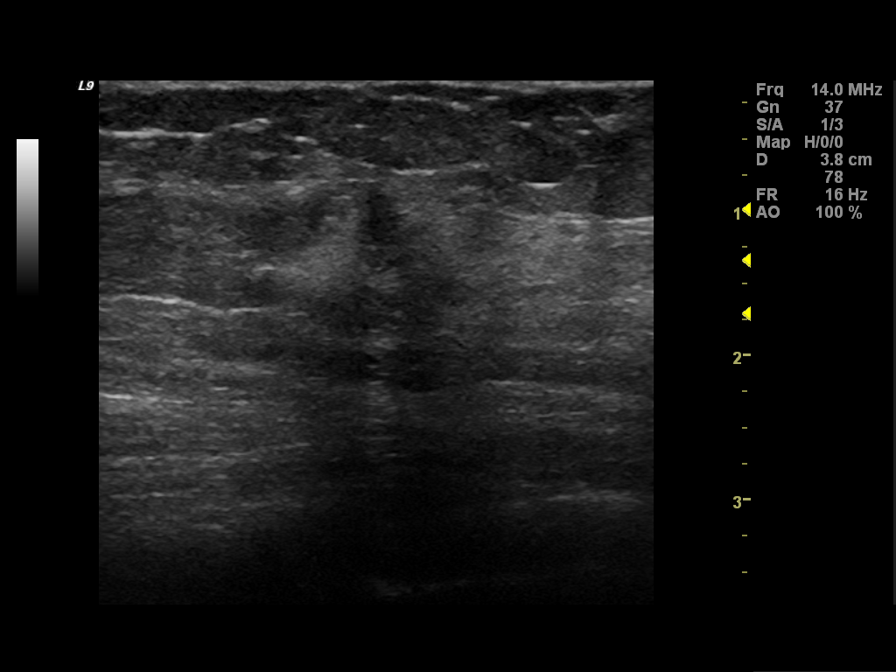
[im 4/9]
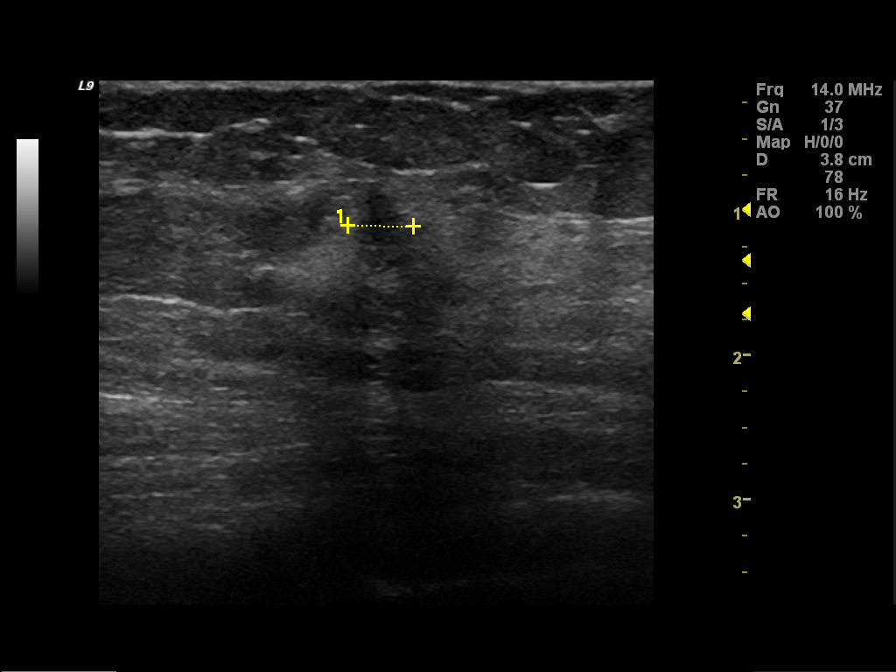
[im 5/9]
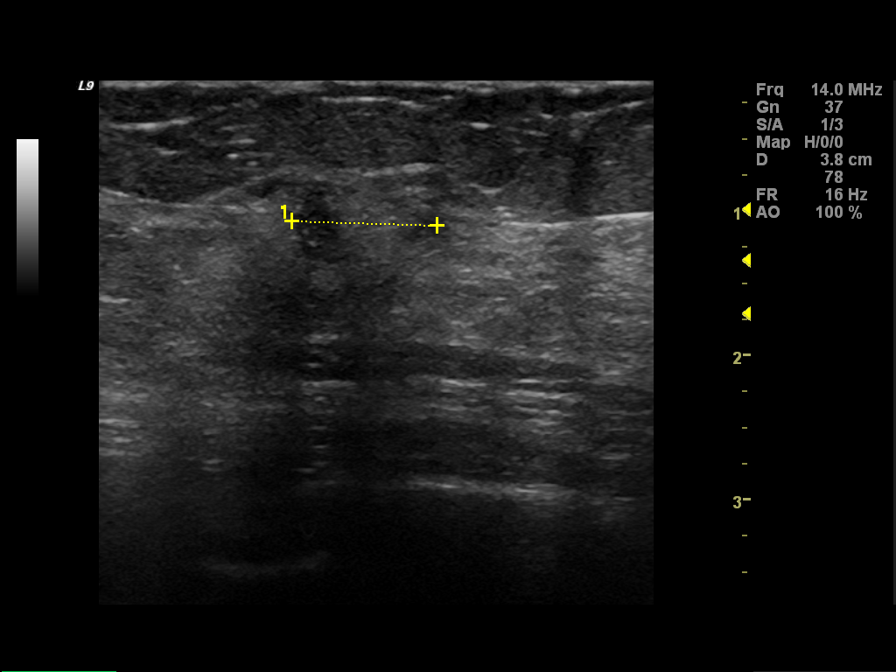
[im 6/9]
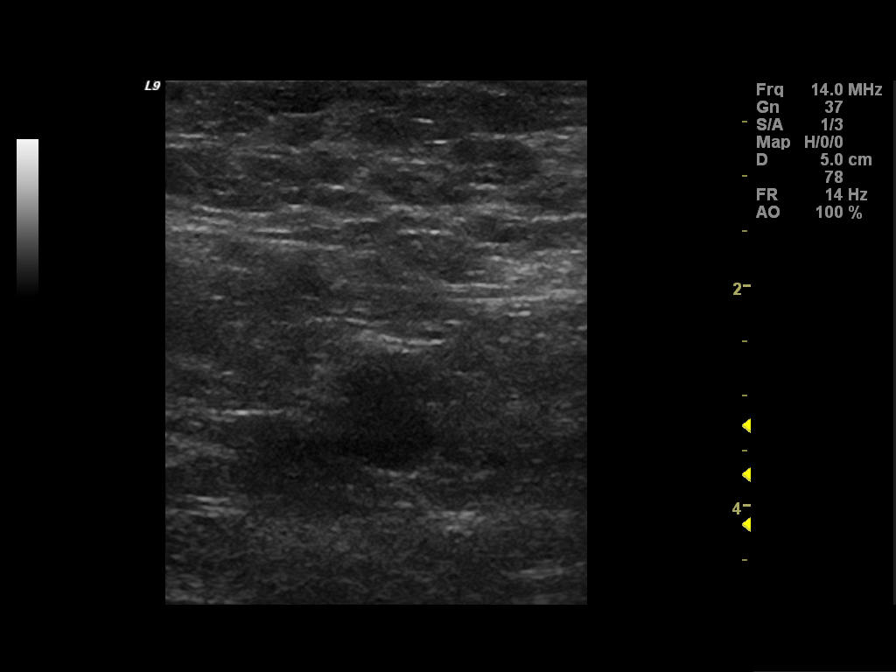
[im 7/9]
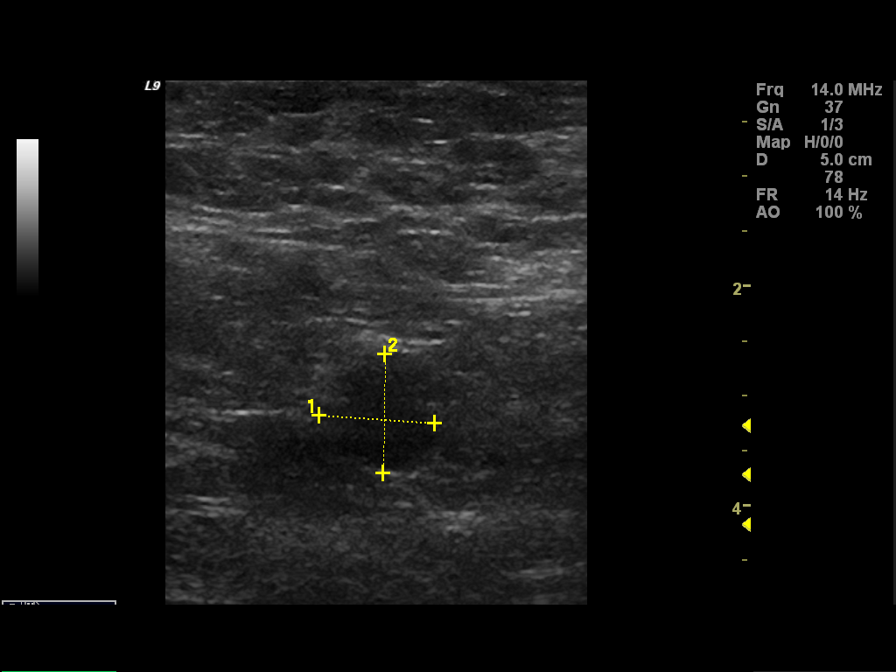
[im 8/9]
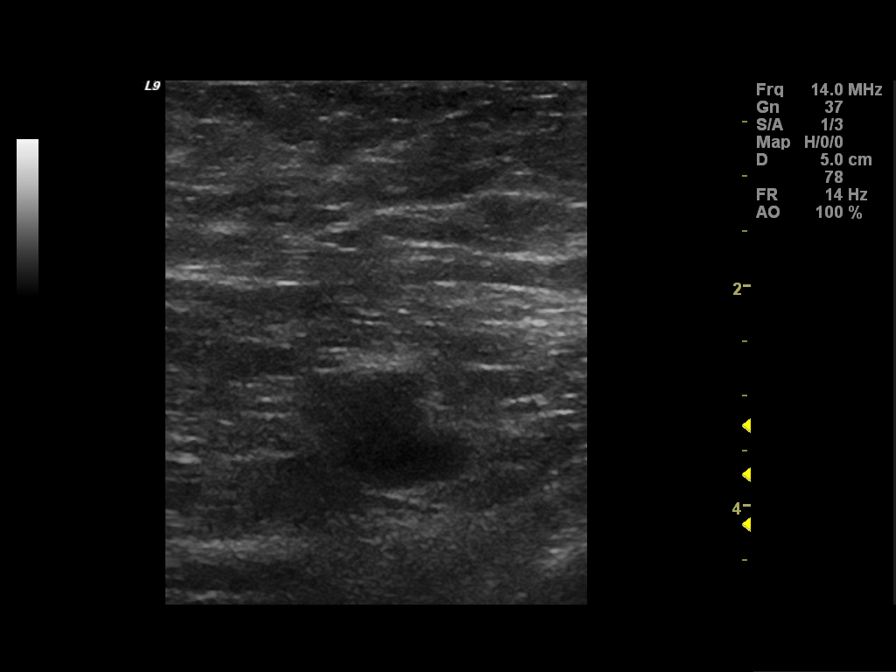
[im 9/9]
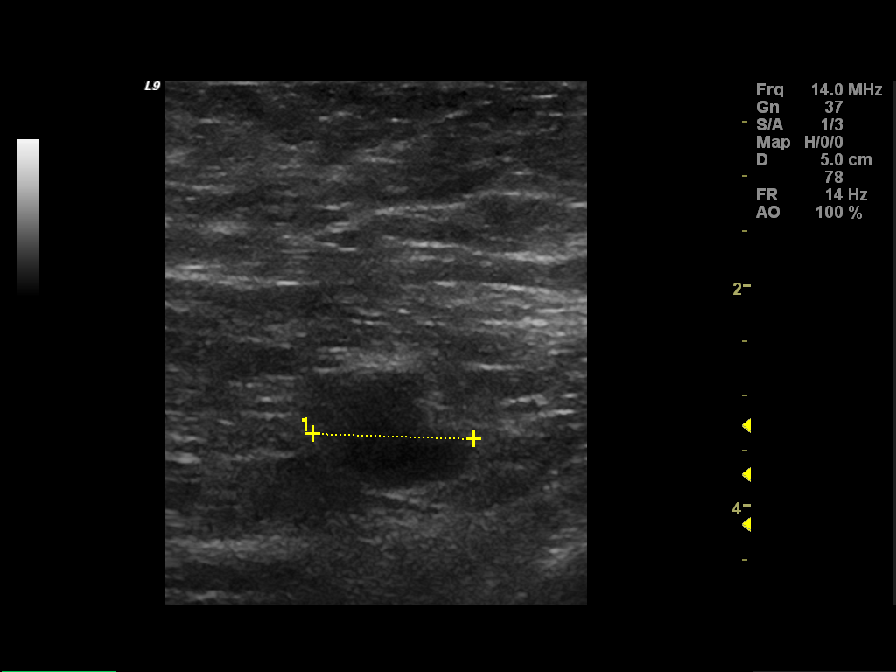

[9 of 9 positions shown; findings below may reference images not displayed]

FINDINGS: Spot compression views in the upper outer quadrant of
the right breast, posteriorly confirm the presence of a ill-defined
mass with associated punctate and linear calcifications.  Compared
to priors.

On physical exam, I palpate a firm, mobile mass in the 9 o'clock
position, 7 cm from the nipple.

Ultrasound is performed, showing an ill-defined hypoechoic mass
with shadowing associated punctate calcifications in the area of
palpable and mammographic concern measuring 0.9 x 1.0 x 1.0 cm.  A
lymph node with diffusely thickened cortex is seen measuring 1.1 x
1.1 x 1.5 cm.
IMPRESSION: Mass, right breast and abnormal right axillary lymph node for which
ultrasound guided biopsies are recommended.

BI-RADS CATEGORY 4:  Suspicious abnormality - biopsy should be
considered.

## 2013-01-03 DIAGNOSIS — E782 Mixed hyperlipidemia: Secondary | ICD-10-CM | POA: Insufficient documentation

## 2013-01-03 DIAGNOSIS — G629 Polyneuropathy, unspecified: Secondary | ICD-10-CM | POA: Insufficient documentation

## 2013-01-10 ENCOUNTER — Other Ambulatory Visit (HOSPITAL_BASED_OUTPATIENT_CLINIC_OR_DEPARTMENT_OTHER): Payer: BC Managed Care – PPO | Admitting: Lab

## 2013-01-10 ENCOUNTER — Encounter (INDEPENDENT_AMBULATORY_CARE_PROVIDER_SITE_OTHER): Payer: Self-pay

## 2013-01-10 DIAGNOSIS — C50419 Malignant neoplasm of upper-outer quadrant of unspecified female breast: Secondary | ICD-10-CM

## 2013-01-10 DIAGNOSIS — C50911 Malignant neoplasm of unspecified site of right female breast: Secondary | ICD-10-CM

## 2013-01-10 LAB — CBC WITH DIFFERENTIAL/PLATELET
Basophils Absolute: 0.1 10*3/uL (ref 0.0–0.1)
Eosinophils Absolute: 0.2 10*3/uL (ref 0.0–0.5)
HGB: 13.5 g/dL (ref 11.6–15.9)
MONO#: 0.7 10*3/uL (ref 0.1–0.9)
NEUT#: 4.8 10*3/uL (ref 1.5–6.5)
RDW: 13.6 % (ref 11.2–14.5)
WBC: 7 10*3/uL (ref 3.9–10.3)
lymph#: 1.3 10*3/uL (ref 0.9–3.3)

## 2013-01-10 LAB — COMPREHENSIVE METABOLIC PANEL (CC13)
Albumin: 3.8 g/dL (ref 3.5–5.0)
BUN: 10.2 mg/dL (ref 7.0–26.0)
Calcium: 9.4 mg/dL (ref 8.4–10.4)
Chloride: 107 mEq/L (ref 98–109)
Glucose: 89 mg/dl (ref 70–140)
Potassium: 3.8 mEq/L (ref 3.5–5.1)
Total Protein: 7.1 g/dL (ref 6.4–8.3)

## 2013-01-17 ENCOUNTER — Telehealth: Payer: Self-pay | Admitting: *Deleted

## 2013-01-17 ENCOUNTER — Encounter: Payer: Self-pay | Admitting: Physician Assistant

## 2013-01-17 ENCOUNTER — Ambulatory Visit (HOSPITAL_BASED_OUTPATIENT_CLINIC_OR_DEPARTMENT_OTHER): Payer: BC Managed Care – PPO | Admitting: Physician Assistant

## 2013-01-17 VITALS — BP 127/85 | HR 91 | Temp 98.6°F | Resp 18 | Ht 63.0 in | Wt 185.7 lb

## 2013-01-17 DIAGNOSIS — M899 Disorder of bone, unspecified: Secondary | ICD-10-CM

## 2013-01-17 DIAGNOSIS — Z780 Asymptomatic menopausal state: Secondary | ICD-10-CM

## 2013-01-17 DIAGNOSIS — M8580 Other specified disorders of bone density and structure, unspecified site: Secondary | ICD-10-CM

## 2013-01-17 DIAGNOSIS — C50911 Malignant neoplasm of unspecified site of right female breast: Secondary | ICD-10-CM

## 2013-01-17 DIAGNOSIS — C50419 Malignant neoplasm of upper-outer quadrant of unspecified female breast: Secondary | ICD-10-CM

## 2013-01-17 DIAGNOSIS — R209 Unspecified disturbances of skin sensation: Secondary | ICD-10-CM

## 2013-01-17 DIAGNOSIS — R0981 Nasal congestion: Secondary | ICD-10-CM

## 2013-01-17 DIAGNOSIS — C773 Secondary and unspecified malignant neoplasm of axilla and upper limb lymph nodes: Secondary | ICD-10-CM

## 2013-01-17 DIAGNOSIS — J3489 Other specified disorders of nose and nasal sinuses: Secondary | ICD-10-CM

## 2013-01-17 DIAGNOSIS — Z853 Personal history of malignant neoplasm of breast: Secondary | ICD-10-CM

## 2013-01-17 MED ORDER — FLUTICASONE PROPIONATE 50 MCG/ACT NA SUSP: 2.0000 | Freq: Every day | NASAL | Status: DC

## 2013-01-17 MED ORDER — ALENDRONATE SODIUM 35 MG PO TABS: 35.0000 mg | ORAL_TABLET | ORAL | Status: AC

## 2013-01-17 NOTE — Progress Notes (Signed)
ID: Janique K Nickolson   DOB: [birth year]  MR#: [id number]  NWG#:[id number]   PCP:  Harlan Stains, MD GYN: SU:  Almond Lint, MD OTHER:   Dorothy Puffer, MD  CHIEF COMPLAINT:  Right Breast Cancer    HISTORY OF PRESENT ILLNESS: Ms. Walthour had screening mammography June 09, 2010 at the breast center.  This suggested a possible abnormality in the right breast.  She was brought back for additional views on April 4.  Dr. Guinevere Ferrari was able to palpate a firm mobile mass at the 9 o'clock position 7 cm from the nipple.  Compression views confirmed an ill-defined mass with punctate and linear calcifications.  Ultrasound found this to be hypoechoic and to measure 1 cm.  In addition there was a lymph node with a diffusely thickened cortex measuring 1.5 cm in the right axilla.  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  With this information the patient was set up for breast MRI on April 13.  This showed in the posterior third of the upper outer quadrant of the right breast an irregular mass measuring 1.8 cm.  There was no multifocal or multicentric disease and there were at least two right axillary lymph nodes with thickened cortices.  There was no left axillary or left breast mass, no internal mammary chain adenopathy.  She proceeded to definitive therapy as detailed below.  INTERVAL HISTORY:  Zailynn returns alone today for followup of her right breast carcinoma. She continues on letrozole with good tolerance. She has occasional hot flashes, nothing she would consider problematic. She continues on alendronate weekly for her history of osteopenia, with her next bone density due in May of  2015.  Interval history is notable for evaluation with Dr. Marchia Bond for increased joint and muscle pain. Her rheumatoid factor was found to be elevated  (26) and she was subsequently seen by a rheumatologist at [facility]. She tells me that he did not think the rheumatoid factor was significant. She was started on Cymbalta, 60 mg daily, which has completely taken care of the pain. If she misses the Cymbalta, she notices an immediate difference. She's tolerating the Cymbalta well.  Otherwise, Dorann continues to be very busy working.  She has had some increased stressors over the past several months associated with her daughter, Florentina Addison, who is now a sophomore at [employer] state.  REVIEW OF SYSTEMS:  Eulla has had no recent  illnesses and denies any fevers or chills. She's had no skin changes, abnormal bruising, or abnormal bleeding. She's eating and drinking well denies any nausea or change in bowel  or bladder habits.   She has some sinus congestion she attributes to seasonal changes, and has some associated postnasal drip. She has a cough occasionally productive of phlegm. She denies any shortness of breath, orthopnea, chest pain, palpitations. She denies any abnormal headaches or dizziness. She's had no peripheral swelling. She still has some residual numbness 3 of the toes on her right foot, but this is not affecting her day-to-day activities.  she previously had signs of carpal tunnel syndrome bilaterally, and this was present prior to her breast cancer diagnosis. Currently, however, she tells me that  her hands feel nominal "all the time" and this has been the case now for 2 months. She denies any injuries. She's had no neck or back pain.    A detailed review of systems was otherwise entirely negative.  PAST MEDICAL HISTORY: Past Medical History  Diagnosis Date  . Cancer 2012    breast ca; right  . Thyroid disease   . History of radiation therapy 02/2011    R breast  . Osteopenia 08/09/2011    PAST  SURGICAL HISTORY: Past Surgical History  Procedure Laterality Date  . Breast surgery  07/08/2010    lumpectomy - right  . Lymph node removal  07/08/2010    right side  . Port-a-cath removal  12/28/2010    power port per patient  . Orthognathic sx  1991    due to accident as a child. sx to straighten dental bite.    FAMILY HISTORY Family History  Problem Relation Age of Onset  . Cancer Maternal Aunt     colon - great maternal aunt  . Cancer Paternal Aunt     ovarian  . Cancer Maternal Aunt     breast  . Colon cancer Neg Hx   The patient's father is alive at age 44.  Patient's mother is alive at age 76.  She has no sisters, does have one brother.  There is no history or breast or ovarian cancer in the immediate family although she has a maternal aunt who was diagnosed with breast cancer before the age of 1 and a paternal aunt who was diagnosed with ovarian cancer before the age of 2.  GYNECOLOGIC HISTORY: Menarche age 34, menopause about five to seven years ago.  She has been on hormone replacement for various periods most recently started estradiol June 10, 2010.  She never had any complications from any of the hormones she took and she has a Mirena IUD in place.  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 They have two daughters "Francesco Runner" Keyandra Swenson, 25, who works in [place], but lives at home and Jill Alexanders "Katie" Waller, 20, who is a a sophomore at [employer] state    ADVANCED DIRECTIVES:  HEALTH MAINTENANCE: (updated 01/17/2013) History  Substance Use Topics  . Smoking status: Never Smoker   . Smokeless tobacco: Never Used  . Alcohol Use: No     Colonoscopy: Never  PAP:  Bone density:  Osteopenia, May 2013  Lipid panel: Dr.  Nicholaus Bloom    No Known Allergies  Current Outpatient Prescriptions  Medication Sig Dispense Refill  . alendronate (FOSAMAX) 35 MG tablet Take 1 tablet (35 mg total) by mouth every 7 (seven) days. Take with a full glass of water on an empty stomach.  15 tablet  3  . aspirin 81 MG tablet Take 81 mg by mouth daily.      Marland Kitchen atorvastatin (LIPITOR) 20 MG tablet Take 20 mg by mouth daily.      . citalopram (CELEXA) 20 MG tablet Take 1 tablet (20 mg total) by mouth daily.  90 tablet  3  . DULoxetine (CYMBALTA) 60 MG capsule Take 60 mg by mouth daily.      . fish oil-omega-3 fatty acids 1000 MG capsule Take 1 g by  mouth daily.      Marland Kitchen gabapentin (NEURONTIN) 300 MG capsule Take 1 capsule (300 mg total) by mouth at bedtime.  90 capsule  3  . letrozole (FEMARA) 2.5 MG tablet Take 1 tablet (2.5 mg total) by mouth daily.  90 tablet  12  . levothyroxine (SYNTHROID, LEVOTHROID) 75 MCG tablet Take 75 mcg by mouth daily.       Marland Kitchen loratadine (CLARITIN) 10 MG tablet Take 10 mg by mouth daily.        . Multiple Vitamin (MULTIVITAMIN) capsule Take 1 capsule by mouth daily.      . phentermine 37.5 MG capsule Take 37.5 mg by mouth every morning.      Marland Kitchen RESVERATROL 100 MG CAPS Take by mouth daily.      . TURMERIC PO Take by mouth daily.      . fluticasone (FLONASE) 50 MCG/ACT nasal spray Place 2 sprays into both nostrils daily.  16 g  3   No current facility-administered medications for this visit.    OBJECTIVE: Middle-aged white woman who appears well Filed Vitals:   01/17/13 0859  BP: 127/85  Pulse: 91  Temp: 98.6 F (37 C)  Resp: 18     Body mass index is 32.9 kg/(m^2).    ECOG FS: 0  Filed Weights   01/17/13 0859  Weight: 185 lb 11.2 oz (84.233 kg)   Physical Exam: HEENT:  Sclerae anicteric.  Oropharynx clear.   Buccal mucosa is pink and moist. Nodes:  No cervical, supraclavicular, or axillary lymphadenopathy palpated.  Breast Exam:  Right breast is status post lumpectomy, with no suspicious  nodularities or skin changes. No evidence of local recurrence.  Left breast is benign  Lungs:  Clear to auscultation bilaterally.  No wheezes or rhonchi. Good excursion bilaterally.  Heart:  Regular rate and rhythm.   No murmur auscultated. Abdomen:  Soft, nontender.  Positive bowel sounds.   Musculoskeletal:  No focal spinal tenderness to palpation. Good range of motion in the upper extremities, although slightly decreased in the right upper extremity.  Extremities:   No peripheral edema Neuro:  Nonfocal. Well oriented.  Positive affect.     LAB RESULTS: Lab Results  Component Value Date   WBC 7.0 01/10/2013   NEUTROABS 4.8 01/10/2013   HGB 13.5 01/10/2013   HCT 40.3 01/10/2013   MCV 90.9 01/10/2013   PLT 176 01/10/2013      Chemistry      Component Value Date/Time   NA 141 01/10/2013 0820   NA 141 08/09/2011 0825   K 3.8 01/10/2013 0820   K 3.6 08/09/2011 0825   CL 105 07/09/2012 0808   CL 105 08/09/2011 0825   CO2 24 01/10/2013 0820   CO2 27 08/09/2011 0825   BUN 10.2 01/10/2013 0820   BUN 13 08/09/2011 0825   CREATININE 0.8 01/10/2013 0820   CREATININE 0.72 08/09/2011 0825      Component Value Date/Time   CALCIUM 9.4 01/10/2013 0820   CALCIUM 9.3 08/09/2011 0825   ALKPHOS 71 01/10/2013 0820   ALKPHOS 98 08/09/2011 0825   AST 26 01/10/2013 0820   AST 30 08/09/2011 0825   ALT 32 01/10/2013 0820   ALT 45* 08/09/2011 0825   BILITOT 0.79 01/10/2013 0820   BILITOT 0.9 08/09/2011 0825       STUDIES:  Most recent bilateral mammogram on  07/30/2012  was unremarkable.   Most recent bone density 07/05/2011 showed osteopenia.     ASSESSMENT: 52 year old [place] woman   (  1)  status post right lumpectomy and axillary lymph node dissection April 2012 for a T1c N1 M0, stage IIA invasive ductal carcinoma, grade 3, strongly estrogen and progesterone receptor positive, HER-2 negative with an MIB-1 of 26%,   (2)  treated adjuvantly with 4 cycles of dose dense doxorubicin and  cyclophosphamide followed by 9 weekly doses of paclitaxel   (3)  radiation completed December of 2012.   (4)  She started letrozole 03/15/2011, with good tolerance   (5) Osteopenia, started alendronate (Fosamax) May 2013  PLAN:  Over half of our 45 minute appointment today was spent answering Shelie's questions and addressing her concerns, reviewing her treatment plan, and coordinating care.   With regards to her breast cancer, Yamilette  is doing well with no clinical evidence of disease recurrence. We are making no changes in her current regimen, and she'll continue on letrozole daily, the plan being to continue for total of 5 years (into January 2018). She will continue on alendronate weekly for the osteopenia, and this was refilled for her today.   I have also given Bea a prescription today for fluticasone nasal spray for seasonal allergies and sinus congestion. She has used this in the past and found it to be very helpful.   I do not think that the numbness in her upper extremities is going to be associated either with her previous diagnosis of breast cancer or her previous chemotherapy. Again, this just occurred over the past 2 months, and she is now 3 years out from completion of chemotherapy. Also do not feel that this is a simple case of carpal tunnel, since it is constant. This could likely be associated with a pinch nerve or disc problem in the cervical spine. I offered Charlet a referral to a neurologist, or an MRI of the cervical spine, and at this time she declines. She will let us know, however, if this problem does not resolve and we will followup accordingly.   I will note that she has also addressed this with her PCP, Dr. Marchia Bond, who continues to follow [place].   Otherwise, Ramey will have her next bone density and mammogram in May of next year, and will return to see [place] in 1 year for routine labs and physical exam. She is very much in favor of this plan, and will call with any  changes or problems prior to her next appointment.   Dorri Ozturk PA-C    01/17/2013

## 2013-01-17 NOTE — Telephone Encounter (Signed)
appts made and printed...td 

## 2013-07-24 ENCOUNTER — Other Ambulatory Visit: Payer: Self-pay | Admitting: Oncology

## 2013-07-31 ENCOUNTER — Ambulatory Visit
Admission: RE | Admit: 2013-07-31 | Discharge: 2013-07-31 | Disposition: A | Discharge status: Home / Self Care | Payer: BC Managed Care – PPO | Source: Ambulatory Visit | Attending: Physician Assistant | Admitting: Physician Assistant

## 2013-07-31 ENCOUNTER — Other Ambulatory Visit: Payer: Self-pay | Admitting: Physician Assistant

## 2013-07-31 ENCOUNTER — Encounter (INDEPENDENT_AMBULATORY_CARE_PROVIDER_SITE_OTHER): Payer: Self-pay

## 2013-07-31 DIAGNOSIS — Z853 Personal history of malignant neoplasm of breast: Secondary | ICD-10-CM

## 2013-07-31 DIAGNOSIS — M8580 Other specified disorders of bone density and structure, unspecified site: Secondary | ICD-10-CM

## 2013-07-31 DIAGNOSIS — Z780 Asymptomatic menopausal state: Secondary | ICD-10-CM

## 2013-08-30 ENCOUNTER — Other Ambulatory Visit: Payer: Self-pay | Admitting: Physician Assistant

## 2013-09-03 ENCOUNTER — Telehealth: Payer: Self-pay | Admitting: Oncology

## 2013-09-03 NOTE — Telephone Encounter (Signed)
r/s pt per GM-Gm on PAL-will call pt to adv of time & date of new appt

## 2013-09-04 ENCOUNTER — Telehealth: Payer: Self-pay | Admitting: Oncology

## 2013-09-04 NOTE — Telephone Encounter (Signed)
cld & left pt message of r/s appt time & date-mailed pt copy of sch

## 2014-01-13 ENCOUNTER — Other Ambulatory Visit: Payer: BC Managed Care – PPO

## 2014-01-20 ENCOUNTER — Ambulatory Visit: Payer: BC Managed Care – PPO | Admitting: Oncology

## 2014-01-21 ENCOUNTER — Other Ambulatory Visit: Payer: Self-pay | Admitting: *Deleted

## 2014-01-21 ENCOUNTER — Other Ambulatory Visit: Payer: BC Managed Care – PPO

## 2014-01-21 ENCOUNTER — Other Ambulatory Visit (HOSPITAL_BASED_OUTPATIENT_CLINIC_OR_DEPARTMENT_OTHER): Payer: BC Managed Care – PPO | Admitting: Oncology

## 2014-01-21 DIAGNOSIS — C50919 Malignant neoplasm of unspecified site of unspecified female breast: Secondary | ICD-10-CM

## 2014-01-21 DIAGNOSIS — C50419 Malignant neoplasm of upper-outer quadrant of unspecified female breast: Secondary | ICD-10-CM

## 2014-01-21 LAB — CBC WITH DIFFERENTIAL/PLATELET
BASO%: 0.6 % (ref 0.0–2.0)
Basophils Absolute: 0 10*3/uL (ref 0.0–0.1)
EOS%: 2.9 % (ref 0.0–7.0)
Eosinophils Absolute: 0.2 10*3/uL (ref 0.0–0.5)
HEMATOCRIT: 42 % (ref 34.8–46.6)
HEMOGLOBIN: 13.7 g/dL (ref 11.6–15.9)
LYMPH%: 23.3 % (ref 14.0–49.7)
MCH: 29.3 pg (ref 25.1–34.0)
MCHC: 32.6 g/dL (ref 31.5–36.0)
MCV: 89.8 fL (ref 79.5–101.0)
MONO#: 0.7 10*3/uL (ref 0.1–0.9)
MONO%: 11.5 % (ref 0.0–14.0)
NEUT#: 4 10*3/uL (ref 1.5–6.5)
NEUT%: 61.7 % (ref 38.4–76.8)
Platelets: 180 10*3/uL (ref 145–400)
RBC: 4.68 10*6/uL (ref 3.70–5.45)
RDW: 13.4 % (ref 11.2–14.5)
WBC: 6.5 10*3/uL (ref 3.9–10.3)
lymph#: 1.5 10*3/uL (ref 0.9–3.3)

## 2014-01-21 LAB — COMPREHENSIVE METABOLIC PANEL (CC13)
ALT: 27 U/L (ref 0–55)
AST: 21 U/L (ref 5–34)
Albumin: 3.9 g/dL (ref 3.5–5.0)
Alkaline Phosphatase: 108 U/L (ref 40–150)
Anion Gap: 7 mEq/L (ref 3–11)
BUN: 10.8 mg/dL (ref 7.0–26.0)
CO2: 27 mEq/L (ref 22–29)
CREATININE: 0.7 mg/dL (ref 0.6–1.1)
Calcium: 9.7 mg/dL (ref 8.4–10.4)
Chloride: 106 mEq/L (ref 98–109)
Glucose: 100 mg/dl (ref 70–140)
Potassium: 4 mEq/L (ref 3.5–5.1)
Sodium: 140 mEq/L (ref 136–145)
Total Bilirubin: 0.72 mg/dL (ref 0.20–1.20)
Total Protein: 6.9 g/dL (ref 6.4–8.3)

## 2014-01-28 ENCOUNTER — Telehealth: Payer: Self-pay | Admitting: Oncology

## 2014-01-28 ENCOUNTER — Ambulatory Visit (HOSPITAL_BASED_OUTPATIENT_CLINIC_OR_DEPARTMENT_OTHER): Payer: BC Managed Care – PPO | Admitting: Oncology

## 2014-01-28 VITALS — BP 135/73 | HR 89 | Temp 98.7°F | Resp 18 | Ht 63.0 in | Wt 203.0 lb

## 2014-01-28 DIAGNOSIS — C773 Secondary and unspecified malignant neoplasm of axilla and upper limb lymph nodes: Secondary | ICD-10-CM

## 2014-01-28 DIAGNOSIS — C50411 Malignant neoplasm of upper-outer quadrant of right female breast: Secondary | ICD-10-CM

## 2014-01-28 DIAGNOSIS — M2550 Pain in unspecified joint: Secondary | ICD-10-CM

## 2014-01-28 DIAGNOSIS — Z170 Estrogen receptor positive status [ER+]: Secondary | ICD-10-CM

## 2014-01-28 DIAGNOSIS — M791 Myalgia: Secondary | ICD-10-CM

## 2014-01-28 DIAGNOSIS — C50911 Malignant neoplasm of unspecified site of right female breast: Secondary | ICD-10-CM

## 2014-01-28 DIAGNOSIS — M8580 Other specified disorders of bone density and structure, unspecified site: Secondary | ICD-10-CM

## 2014-01-28 MED ORDER — TAMOXIFEN CITRATE 20 MG PO TABS: 20.0000 mg | ORAL_TABLET | Freq: Every day | ORAL | Status: DC

## 2014-01-28 MED ORDER — CITALOPRAM HYDROBROMIDE 20 MG PO TABS: ORAL_TABLET | ORAL | Status: DC

## 2014-01-28 MED ORDER — GABAPENTIN 300 MG PO CAPS: 300.0000 mg | ORAL_CAPSULE | Freq: Three times a day (TID) | ORAL | Status: DC

## 2014-01-28 NOTE — Progress Notes (Signed)
Patient has a living will in place.

## 2014-01-28 NOTE — Addendum Note (Signed)
Addended by: Laureen Abrahams on: 01/28/2014 05:46 PM   Modules accepted: Medications

## 2014-01-28 NOTE — Telephone Encounter (Signed)
sch pt appt-gave pt copy of sch

## 2014-01-28 NOTE — Progress Notes (Signed)
ID: [name]   DOB: 09/19/1960  MR#: [id number]  CVE#:[id number]   PCP:  Claire Grill, MD GYN: [name] M.D. SU:  Claire Klein, MD OTHER:   Claire Rudd, MD  CHIEF COMPLAINT:  Right Breast Cancer  CURRENT TREATMENT: Antiestrogen therapy    HISTORY OF PRESENT ILLNESS: From the original intake note:  [name] had screening mammography June 09, 2010 at the breast center.  This suggested a possible abnormality in the right breast.  She was brought back for additional views on April 4.  Dr. Emily Barnes was able to palpate a firm mobile mass at the 9 o'clock position 7 cm from the nipple.  Compression views confirmed an ill-defined mass with punctate and linear calcifications.  Ultrasound found this to be hypoechoic and to measure 1 cm.  In addition there was a lymph node with a diffusely thickened cortex measuring 1.5 cm in the right axilla.  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  With this information the patient was set up for breast MRI on April 13.  This showed in the posterior third of the upper outer quadrant of the right breast an irregular mass measuring 1.8 cm.  There was no multifocal or multicentric disease and there were at least two right axillary lymph nodes with thickened cortices.  There was no left axillary or left breast mass, no internal mammary chain adenopathy.  She proceeded to definitive therapy as detailed below.  INTERVAL HISTORY:  [name] returns Today for follow-up of her breast cancer. She continues on letrozole. Aside from the expected hot flashes and vaginal dryness, she is having some bladder problems which may well be related to  the vaginal atrophy issue. In addition she feels like she has put prematurely 8" and achy. This was to the point that Dr. Marianna Barnes  Obtained a rheumatoid factor and sent the patient to [facility] for evaluation (which was unrevealing).  REVIEW OF SYSTEMS:  [name] has some allergy symptoms, which are not more persistent or intense than before. She has a little spot in the right breast lateral scar which sometimes is tender. It is not swollen or red. She is moderately constipated. She gets numbness in her hands or fingers sometimes, particularly in the morning. Otherwise a detailed review of systems today was stable  PAST MEDICAL HISTORY: Past Medical History  Diagnosis Date  . Cancer 2012    breast ca; right  . Thyroid disease   . History of radiation therapy 02/2011    R breast  . Osteopenia 08/09/2011    PAST SURGICAL HISTORY: Past Surgical History  Procedure Laterality Date  . Breast surgery  07/08/2010    lumpectomy - right  . Lymph node removal  07/08/2010    right side  . Port-a-cath removal  12/28/2010    power port per patient  . Orthognathic sx  1991    due to accident as a child. sx to straighten dental bite.    FAMILY HISTORY Family History  Problem Relation Age of Onset  . Cancer Maternal Aunt     colon - great maternal aunt  . Cancer Paternal Aunt     ovarian  .  Cancer Maternal Aunt     breast  . Colon cancer Neg Hx   The patient's father is alive at age 54.  Patient's mother is alive at age 32.  She has no sisters, does have one brother.  There is no history or breast or ovarian cancer in the immediate family although she has a maternal aunt who was diagnosed with breast cancer before the age of 22 and a paternal aunt who was diagnosed with ovarian cancer before the age of 64.  GYNECOLOGIC HISTORY: Menarche age 75, menopause about five to seven years ago.  She has been on hormone replacement for various periods most recently started estradiol June 10, 2010.  She never had  any complications from any of the hormones she took and she has a Mirena IUD in place.  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 They have two daughters "Claire Pall" Mayumi Barnes, 26, who works in [place], but lives at home and [name], 21, who is a a Paramedic at [place] [place] [employer]: not in [place]: (updated 01/17/2013) History  Substance Use Topics  . Smoking status: Never Smoker   . Smokeless tobacco: Never Used  . Alcohol Use: No     Colonoscopy: Never  PAP:  Bone density:  Osteopenia, May 2013  Lipid panel: Dr. Charlean Merl    No Known Allergies  Current Outpatient Prescriptions  Medication Sig Dispense Refill  . aspirin 81 MG tablet Take 81 mg by mouth daily.    Marland Kitchen atorvastatin (LIPITOR) 20 MG tablet Take 20 mg by mouth daily.    . citalopram (CELEXA) 20 MG tablet TAKE 1 TABLET (20 MG TOTAL) BY MOUTH DAILY. 90 tablet 1  . DULoxetine (CYMBALTA) 60 MG capsule Take 60 mg by mouth daily.    . fish oil-omega-3 fatty acids 1000 MG capsule Take 1 g by mouth daily.    . fluticasone (FLONASE) 50 MCG/ACT nasal spray Place 2 sprays into both nostrils daily. 16 g 3  . gabapentin (NEURONTIN) 300 MG capsule TAKE 1 CAPSULE (300 MG TOTAL) BY MOUTH AT BEDTIME. 90 capsule 3  . letrozole (FEMARA) 2.5 MG tablet TAKE 1 TABLET (2.5 MG TOTAL) BY MOUTH DAILY. 90 tablet 3  . levothyroxine (SYNTHROID, LEVOTHROID) 75 MCG tablet Take 75 mcg by mouth daily.     Marland Kitchen loratadine (CLARITIN) 10 MG tablet Take 10 mg by mouth daily.      . Multiple Vitamin (MULTIVITAMIN) capsule Take 1 capsule by mouth daily.    . phentermine 37.5 MG capsule Take 37.5 mg by mouth every morning.    Marland Kitchen RESVERATROL 100 MG CAPS Take by mouth daily.     . TURMERIC PO Take by mouth daily.     No current facility-administered medications for this visit.    OBJECTIVE: Middle-aged white woman in no acute distress  Filed Vitals:   01/28/14 0921  BP: 135/73  Pulse: 89  Temp: 98.7 F (37.1 C)  Resp: 18     Body mass index is 35.97 kg/(m^2).    ECOG FS: 0  Filed Weights   01/28/14 0921  Weight: 203 lb (92.08 kg)   Sclerae unicteric, pupils equal and reactive Oropharynx clear and moist-- no  thrush No cervical or supraclavicular adenopathy Lungs no rales or rhonchi Heart regular rate and rhythm Abd soft, obese,nontender, positive bowel sounds MSK no focal spinal tenderness, no upper extremity lymphedema Neuro: nonfocal, well oriented, appropriate affect Breasts: the right breast is status post lumpectomy and radiation. The area where she feels some tenderness feels like simple scar tissue. There are no findings of concern for local recurrence. The right axilla is benign. The left breast is unremarkable   LAB RESULTS: Lab Results  Component Value Date   WBC 6.5 01/21/2014   NEUTROABS 4.0 01/21/2014   HGB 13.7 01/21/2014   HCT 42.0 01/21/2014   MCV 89.8 01/21/2014   PLT 180 01/21/2014      Chemistry      Component Value Date/Time   NA 140 01/21/2014 1137   NA 141 08/09/2011 0825   K 4.0 01/21/2014 1137   K 3.6 08/09/2011 0825   CL 105 07/09/2012 0808   CL 105 08/09/2011 0825   CO2 27 01/21/2014 1137   CO2 27 08/09/2011 0825   BUN 10.8 01/21/2014 1137   BUN 13 08/09/2011 0825   CREATININE 0.7 01/21/2014 1137   CREATININE 0.72 08/09/2011 0825      Component Value Date/Time   CALCIUM 9.7 01/21/2014 1137   CALCIUM 9.3 08/09/2011 0825   ALKPHOS 108 01/21/2014 1137   ALKPHOS 98 08/09/2011 0825   AST 21 01/21/2014 1137   AST 30 08/09/2011 0825   ALT 27 01/21/2014 1137   ALT 45* 08/09/2011 0825   BILITOT 0.72 01/21/2014 1137   BILITOT 0.9 08/09/2011 0825       STUDIES:  Most recent bilateral mammogram on May  2015 was unremarkable.   CLINICAL DATA: 53 year old postmenopausal white female with prior history of the breast cancer and hypothyroidism. The patient currently takes calcium and vitamin-D. In addition, the patient currently takes Fosamax for the past 2 years. Follow-up exam.  EXAM: DUAL X-RAY ABSORPTIOMETRY (DXA) FOR BONE MINERAL DENSITY  FINDINGS: AP LUMBAR SPINE  Bone Mineral Density (BMD): 0.853 g/cm2  Young Adult T-Score: -1.8  Z-Score: -0.8  LEFT FEMUR NECK  Bone Mineral Density (BMD): 0.797 g/cm2  Young Adult T-Score: -0.5  Z-Score: 0.5  ASSESSMENT: Patient's diagnostic category is LOW BONE MASS by WHO Criteria.  FRACTURE RISK: UNKNOWN  FRAX: World Health Organization FRAX assessment of absolute fracture risk is not calculated for this patient because the patient has a history bone building therapy.  COMPARISON: Comparison is made to 06/14/2011: Since the prior exam there has been no significant interval change in bone mineral density of the lumbar spine and no significant interval change in bone mineral density of the left hip.   ASSESSMENT: 53 year old [place] woman   (1)  status post right lumpectomy and axillary lymph node dissection April 2012 for a T1c N1 M0, stage IIA invasive ductal carcinoma, grade 3, strongly estrogen and progesterone receptor positive, HER-2 negative with an MIB-1 of 26%,   (2)  treated adjuvantly with 4 cycles of dose dense doxorubicin and cyclophosphamide followed by 9 weekly doses of paclitaxel   (3)  radiation completed December of 2012.   (4)  She started letrozole 03/15/2011, switched to tamoxifen as of January 2016  (5) Osteopenia, started alendronate (Fosamax) May 2013  PLAN:  I spent approximately 45 minutes with Korie going over her situation. She has completed 2 years of letrozole. Her bone density is stable. She is tolerating the alendronate well.  The problem is that she is getting worsening  vaginal dryness which is really causing other issues. She also is developing more arthralgias and myalgias. I do think that letrozole is likely at least contributing to those symptoms. The hand numbness she has at times is more likely to be unrelated (most likely carpal tunnel).  If we switched to tamoxifen at this point she will lose [place] effectiveness in terms of breast cancer prevention. However we would be able to use vaginal estrogens if she can take the tamoxifen. Also tamoxifen does not cause the arthralgias myalgias she is experiencing. We discussed all this in detail and she has a good understanding of the possible toxicities, side effects and complications of tamoxifen including the potential for endometrial cancer, which is rare, and the possibility of blood clots.  After much discussion she decided she would like to try tamoxifen. Accordingly she is stopping the letrozole now. She will start tamoxifen 03/14/2014. She will see me in late May 2016, after her next mammogram. If she is doing well on tamoxifen we will likely add Estring at that point.  She has a good understanding of the overall plan. She agrees with it. She knows the goal of treatment in her case is cure. She will call with any problems that may develop before her next visit here.  Chauncey Cruel, MD     01/28/2014

## 2014-02-15 ENCOUNTER — Other Ambulatory Visit: Payer: Self-pay | Admitting: Oncology

## 2014-08-05 ENCOUNTER — Other Ambulatory Visit (HOSPITAL_BASED_OUTPATIENT_CLINIC_OR_DEPARTMENT_OTHER): Payer: BLUE CROSS/BLUE SHIELD

## 2014-08-05 DIAGNOSIS — C50411 Malignant neoplasm of upper-outer quadrant of right female breast: Secondary | ICD-10-CM | POA: Diagnosis not present

## 2014-08-05 DIAGNOSIS — C773 Secondary and unspecified malignant neoplasm of axilla and upper limb lymph nodes: Secondary | ICD-10-CM

## 2014-08-05 DIAGNOSIS — C50911 Malignant neoplasm of unspecified site of right female breast: Secondary | ICD-10-CM

## 2014-08-05 LAB — CBC WITH DIFFERENTIAL/PLATELET
BASO%: 0.7 % (ref 0.0–2.0)
BASOS ABS: 0 10*3/uL (ref 0.0–0.1)
EOS%: 4.2 % (ref 0.0–7.0)
Eosinophils Absolute: 0.3 10*3/uL (ref 0.0–0.5)
HCT: 40.1 % (ref 34.8–46.6)
HGB: 13.5 g/dL (ref 11.6–15.9)
LYMPH#: 1.5 10*3/uL (ref 0.9–3.3)
LYMPH%: 25.4 % (ref 14.0–49.7)
MCH: 30.1 pg (ref 25.1–34.0)
MCHC: 33.7 g/dL (ref 31.5–36.0)
MCV: 89.3 fL (ref 79.5–101.0)
MONO#: 0.6 10*3/uL (ref 0.1–0.9)
MONO%: 10.4 % (ref 0.0–14.0)
NEUT%: 59.3 % (ref 38.4–76.8)
NEUTROS ABS: 3.5 10*3/uL (ref 1.5–6.5)
Platelets: 165 10*3/uL (ref 145–400)
RBC: 4.49 10*6/uL (ref 3.70–5.45)
RDW: 13.5 % (ref 11.2–14.5)
WBC: 6 10*3/uL (ref 3.9–10.3)

## 2014-08-05 LAB — COMPREHENSIVE METABOLIC PANEL (CC13)
ALK PHOS: 62 U/L (ref 40–150)
ALT: 20 U/L (ref 0–55)
ANION GAP: 13 meq/L — AB (ref 3–11)
AST: 22 U/L (ref 5–34)
Albumin: 3.6 g/dL (ref 3.5–5.0)
BILIRUBIN TOTAL: 0.59 mg/dL (ref 0.20–1.20)
BUN: 10.7 mg/dL (ref 7.0–26.0)
CALCIUM: 8.5 mg/dL (ref 8.4–10.4)
CO2: 23 meq/L (ref 22–29)
CREATININE: 0.8 mg/dL (ref 0.6–1.1)
Chloride: 106 mEq/L (ref 98–109)
EGFR: 87 mL/min/{1.73_m2} — AB (ref >90)
Glucose: 95 mg/dl (ref 70–140)
POTASSIUM: 3.8 meq/L (ref 3.5–5.1)
SODIUM: 142 meq/L (ref 136–145)
Total Protein: 6.4 g/dL (ref 6.4–8.3)

## 2014-08-12 ENCOUNTER — Ambulatory Visit (HOSPITAL_BASED_OUTPATIENT_CLINIC_OR_DEPARTMENT_OTHER): Payer: BLUE CROSS/BLUE SHIELD | Admitting: Oncology

## 2014-08-12 VITALS — BP 141/82 | HR 93 | Temp 98.3°F | Resp 18 | Ht 63.0 in | Wt 197.1 lb

## 2014-08-12 DIAGNOSIS — C773 Secondary and unspecified malignant neoplasm of axilla and upper limb lymph nodes: Secondary | ICD-10-CM | POA: Diagnosis not present

## 2014-08-12 DIAGNOSIS — Z79810 Long term (current) use of selective estrogen receptor modulators (SERMs): Secondary | ICD-10-CM

## 2014-08-12 DIAGNOSIS — C50411 Malignant neoplasm of upper-outer quadrant of right female breast: Secondary | ICD-10-CM | POA: Diagnosis not present

## 2014-08-12 DIAGNOSIS — J3089 Other allergic rhinitis: Secondary | ICD-10-CM | POA: Diagnosis not present

## 2014-08-12 DIAGNOSIS — M8580 Other specified disorders of bone density and structure, unspecified site: Secondary | ICD-10-CM

## 2014-08-12 DIAGNOSIS — C50911 Malignant neoplasm of unspecified site of right female breast: Secondary | ICD-10-CM

## 2014-08-12 NOTE — Progress Notes (Signed)
ID: [name]   DOB: 01-02-1961  MR#: [id number]  GYI#:[id number]   PCP:  Claire Grill, MD GYN: [name] M.D. SU:  Claire Klein, MD OTHER:   Claire Rudd, MD  CHIEF COMPLAINT:  Right Breast Cancer  CURRENT TREATMENT: Antiestrogen therapy    HISTORY OF PRESENT ILLNESS: From the original intake note:  [name] had screening mammography June 09, 2010 at the breast center.  This suggested a possible abnormality in the right breast.  She was brought back for additional views on April 4.  Dr. Emily Barnes was able to palpate a firm mobile mass at the 9 o'clock position 7 cm from the nipple.  Compression views confirmed an ill-defined mass with punctate and linear calcifications.  Ultrasound found this to be hypoechoic and to measure 1 cm.  In addition there was a lymph node with a diffusely thickened cortex measuring 1.5 cm in the right axilla.  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  With this information the patient was set up for breast MRI on April 13.  This showed in the posterior third of the upper outer quadrant of the right breast an irregular mass measuring 1.8 cm.  There was no multifocal or multicentric disease and there were at least two right axillary lymph nodes with thickened cortices.  There was no left axillary or left breast mass, no internal mammary chain adenopathy.  She proceeded to definitive therapy as detailed below.  INTERVAL HISTORY:  [name] returns today for follow-up of her breast cancer. Interval history is significant for her going off letrozole in the fall of 2015. She felt "a bit better" but not that much better and so in January her  primary care physician stop the Lipitor. That made a bigger difference. Despite that she did not go back on letrozole but started tamoxifen in January, as planned (actually 03/14/2014). She is tolerating it well. She has noted a slight increase in hot flashes which she is controlling with gabapentin. She has had no vaginal wetness. She does have some vaginal dryness problems. She obtains a drug at $4 a month, which is of course very favorable  REVIEW OF SYSTEMS:  [name] to have near constant allergy problems. 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 There is going to be a great deal of dust and other debris floating around and she does not wear a mask on a regular basis. She does have some joint pain still, but as noted above it is better. Sometimes she has mouth sores. She has stress urinary incontinence. She admits to anxiety but not depression. A detailed review of systems today was otherwise stable.  PAST MEDICAL HISTORY: Past Medical History  Diagnosis Date  . Cancer 2012    breast ca; right  . Thyroid disease   . History of radiation therapy 02/2011    R breast  . Osteopenia 08/09/2011    PAST SURGICAL HISTORY: Past Surgical History  Procedure Laterality Date  . Breast surgery  07/08/2010    lumpectomy - right  . Lymph node removal  07/08/2010    right side  . Port-a-cath removal  12/28/2010    power port  per patient  . Orthognathic sx  1991    due to accident as a child. sx to straighten dental bite.    FAMILY HISTORY Family History  Problem Relation Age of Onset  . Cancer Maternal Aunt     colon - great maternal aunt  . Cancer Paternal Aunt     ovarian  . Cancer Maternal Aunt     breast  . Colon cancer Neg Hx   The patient's father is alive at age 28.  Patient's mother is alive at age 1.  She has no sisters, does have one brother.  There is no history or breast or ovarian cancer in the immediate family although she has a maternal aunt who was diagnosed  with breast cancer before the age of 1 and a paternal aunt who was diagnosed with ovarian cancer before the age of 79.  GYNECOLOGIC HISTORY: Menarche age 69, menopause about five to seven years ago.  She has been on hormone replacement for various periods most recently started estradiol June 10, 2010.  She never had any complications from any of the hormones she took and she has a Mirena IUD in place.  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 They have two daughters "[name]" [name], 26, who works in [place], but lives at home and [name], 21, who is a a Paramedic at [place]: not in [place]: (updated 01/17/2013) History  Substance Use Topics  . Smoking status: Never Smoker   . Smokeless tobacco: Never Used  . Alcohol Use: No     Colonoscopy: Never  PAP:  Bone density:  Osteopenia, May 2013  Lipid panel: [name]    No Known Allergies  Current Outpatient Prescriptions  Medication Sig Dispense Refill  . aspirin 81 MG tablet Take 81 mg by mouth daily.    Marland Kitchen atorvastatin (LIPITOR) 20 MG tablet Take 20 mg by mouth daily.    . citalopram (CELEXA) 20 MG tablet TAKE 1 TABLET (20 MG TOTAL) BY MOUTH DAILY. 90 tablet 1  . citalopram (CELEXA) 20 MG tablet TAKE 1 TABLET (20 MG TOTAL) BY MOUTH DAILY. 90 tablet 1  . DULoxetine (CYMBALTA) 60 MG capsule Take 60 mg by mouth daily.    . fish oil-omega-3 fatty acids 1000 MG capsule Take 1 g by mouth daily.    . fluticasone (FLONASE) 50 MCG/ACT nasal spray Place 2 sprays into both nostrils daily. 16 g 3  . gabapentin (NEURONTIN) 300 MG capsule Take 1 capsule (300 mg total) by mouth 3 (three) times daily. 90 capsule 3  . levothyroxine  (SYNTHROID, LEVOTHROID) 75 MCG tablet Take 75 mcg by mouth daily.     Marland Kitchen loratadine (CLARITIN) 10 MG tablet Take 10 mg by mouth daily.      . Multiple Vitamin (MULTIVITAMIN) capsule Take 1 capsule by mouth daily.    . phentermine 37.5 MG capsule Take 37.5 mg by mouth every morning.    Marland Kitchen RESVERATROL 100 MG CAPS Take by mouth daily.    . tamoxifen (NOLVADEX) 20 MG tablet Take 1 tablet (20 mg total) by mouth daily. 90 tablet 4  . TURMERIC PO Take by mouth daily.  No current facility-administered medications for this visit.    OBJECTIVE: Middle-aged white woman who appears stated age  54 Vitals:   08/12/14 1604  BP: 141/82  Pulse: 93  Temp: 98.3 F (36.8 C)  Resp: 18     Body mass index is 34.92 kg/(m^2).    ECOG FS: 1  Filed Weights   08/12/14 1604  Weight: 197 lb 1.6 oz (89.404 kg)   Sclerae unicteric, pupils round and equal Oropharynx clear and moist-- no thrush or other lesions No cervical or supraclavicular adenopathy Lungs no rales or rhonchi Heart regular rate and rhythm Abd soft, obese,nontender, positive bowel sounds MSK no focal spinal tenderness, no upper extremity lymphedema Neuro: nonfocal, well oriented, appropriate affect Breasts: the right breast is status post lumpectomy and radiation. There is no evidence of local recurrence. The right axilla is benign.   LAB RESULTS: Lab Results  Component Value Date   WBC 6.0 08/05/2014   NEUTROABS 3.5 08/05/2014   HGB 13.5 08/05/2014   HCT 40.1 08/05/2014   MCV 89.3 08/05/2014   PLT 165 08/05/2014      Chemistry      Component Value Date/Time   NA 142 08/05/2014 0818   NA 141 08/09/2011 0825   K 3.8 08/05/2014 0818   K 3.6 08/09/2011 0825   CL 105 07/09/2012 0808   CL 105 08/09/2011 0825   CO2 23 08/05/2014 0818   CO2 27 08/09/2011 0825   BUN 10.7 08/05/2014 0818   BUN 13 08/09/2011 0825   CREATININE 0.8 08/05/2014 0818   CREATININE 0.72 08/09/2011 0825      Component Value Date/Time   CALCIUM 8.5  08/05/2014 0818   CALCIUM 9.3 08/09/2011 0825   ALKPHOS 62 08/05/2014 0818   ALKPHOS 98 08/09/2011 0825   AST 22 08/05/2014 0818   AST 30 08/09/2011 0825   ALT 20 08/05/2014 0818   ALT 45* 08/09/2011 0825   BILITOT 0.59 08/05/2014 0818   BILITOT 0.9 08/09/2011 0825       STUDIES: She is overdue for mammography and will call to have that scheduled within the next 2 weeks.  ASSESSMENT: 54 y.o.  [name] woman   (1)  status post right lumpectomy and axillary lymph node dissection April 2012 for a T1c N1 M0, stage IIA invasive ductal carcinoma, grade 3, strongly estrogen and progesterone receptor positive, HER-2 negative with an MIB-1 of 26%,   (2)  treated adjuvantly with 4 cycles of dose dense doxorubicin and cyclophosphamide followed by 9 weekly doses of paclitaxel   (3)  radiation completed December of 2012.   (4)  She started letrozole 03/15/2011, switched to tamoxifen as of January 2016  (5) Osteopenia, started alendronate (Fosamax) May 2013  PLAN:  [name] is doing fine as far as her breast cancer is concerned, and in particular she is tolerating the tamoxifen well. She therefore has the option of using local estrogen preparations, so long as she continues on tamoxifen. She is interested especially in Estring and we went over the pluses and minuses and possible complications of that medication and I will go ahead and place a prescription for her.  She is behind on her mammography. She should have that done before she sees her gynecologist next month  She is also behind on colonoscopy screening. This was set up by Dr. Nori Riis about 2 years ago but the patient's insurance at that time was very unfavorable. She now has better insurance. I have referred her to Dr. Paulita Fujita regarding that.  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 She is not wearing a mask and a consistent basis. I'm referring her to Dr. Dagmar Hait for  further evaluation and treatment of her sinus problems  She has a good understanding of the overall plan. She agrees with it. She knows the goal of treatment in her case is cure. She will call with any problems that may develop before her next visit here.  Chauncey Cruel, MD     08/12/2014

## 2014-08-13 ENCOUNTER — Other Ambulatory Visit: Payer: Self-pay

## 2014-08-13 ENCOUNTER — Telehealth: Payer: Self-pay | Admitting: Oncology

## 2014-08-13 DIAGNOSIS — C50911 Malignant neoplasm of unspecified site of right female breast: Secondary | ICD-10-CM

## 2014-08-13 MED ORDER — ESTRADIOL 2 MG VA RING: 2.0000 mg | VAGINAL_RING | VAGINAL | Status: DC

## 2014-08-13 NOTE — Telephone Encounter (Signed)
Confirmed appointment for May/June 2017.

## 2014-08-13 NOTE — Progress Notes (Signed)
Pt called in to check on "hormone prescription.  Per GM note 5/31, estring to be ordered.  Order placed - receipt confirmed.

## 2014-08-14 ENCOUNTER — Encounter: Payer: Self-pay | Admitting: Oncology

## 2014-08-14 NOTE — Progress Notes (Signed)
I faxed prior auth req for estring to [employer]

## 2014-08-15 ENCOUNTER — Encounter: Payer: Self-pay | Admitting: Oncology

## 2014-08-15 NOTE — Progress Notes (Signed)
Per [employer] they have denied request for Estring. She has to try 2 others and fail.examples are Vagifem or Premarin vaginal cream. I will message Dr. Jana Hakim to let him know.

## 2014-08-18 ENCOUNTER — Encounter: Payer: Self-pay | Admitting: Oncology

## 2014-08-18 ENCOUNTER — Other Ambulatory Visit: Payer: Self-pay | Admitting: *Deleted

## 2014-08-18 ENCOUNTER — Other Ambulatory Visit: Payer: Self-pay | Admitting: Oncology

## 2014-08-18 MED ORDER — ESTROGENS, CONJUGATED 0.625 MG/GM VA CREA: 1.0000 | TOPICAL_CREAM | Freq: Every day | VAGINAL | Status: DC

## 2014-08-18 NOTE — Progress Notes (Signed)
Per note from dr. Jana Hakim he is going to have the patient try premarin cream. See prev notes of [employer] denying estring. She just try cream 1st

## 2014-08-18 NOTE — Telephone Encounter (Signed)
This RN contacted pt per need to change form for localized vaginal estrogen from the Estring- to premarin cream per insurance coverage.  Pt verbalized understanding.

## 2014-08-25 ENCOUNTER — Other Ambulatory Visit: Payer: Self-pay | Admitting: Obstetrics & Gynecology

## 2014-08-26 LAB — CYTOLOGY - PAP

## 2014-08-27 ENCOUNTER — Other Ambulatory Visit: Payer: Self-pay | Admitting: *Deleted

## 2014-09-05 ENCOUNTER — Telehealth: Payer: Self-pay

## 2014-09-05 NOTE — Telephone Encounter (Signed)
Pre-authorization form for estring given to managed care.

## 2014-09-17 ENCOUNTER — Other Ambulatory Visit: Payer: Self-pay | Admitting: Oncology

## 2014-10-30 ENCOUNTER — Other Ambulatory Visit: Payer: Self-pay | Admitting: Oncology

## 2014-10-30 DIAGNOSIS — C50911 Malignant neoplasm of unspecified site of right female breast: Secondary | ICD-10-CM

## 2014-10-30 DIAGNOSIS — Z9889 Other specified postprocedural states: Secondary | ICD-10-CM

## 2014-11-05 ENCOUNTER — Ambulatory Visit
Admission: RE | Admit: 2014-11-05 | Discharge: 2014-11-05 | Disposition: A | Discharge status: Home / Self Care | Payer: BLUE CROSS/BLUE SHIELD | Source: Ambulatory Visit | Attending: Oncology | Admitting: Oncology

## 2014-11-05 DIAGNOSIS — C50911 Malignant neoplasm of unspecified site of right female breast: Secondary | ICD-10-CM

## 2014-11-05 DIAGNOSIS — Z9889 Other specified postprocedural states: Secondary | ICD-10-CM

## 2015-02-25 ENCOUNTER — Other Ambulatory Visit: Payer: Self-pay | Admitting: *Deleted

## 2015-02-26 ENCOUNTER — Other Ambulatory Visit: Payer: Self-pay | Admitting: Oncology

## 2015-06-18 ENCOUNTER — Telehealth: Payer: Self-pay | Admitting: Oncology

## 2015-06-18 NOTE — Telephone Encounter (Signed)
Confirmed apt 6/7 @ 9.15 with HB.

## 2015-08-11 ENCOUNTER — Other Ambulatory Visit: Payer: Self-pay | Admitting: *Deleted

## 2015-08-11 DIAGNOSIS — C50911 Malignant neoplasm of unspecified site of right female breast: Secondary | ICD-10-CM

## 2015-08-12 ENCOUNTER — Other Ambulatory Visit: Payer: BLUE CROSS/BLUE SHIELD

## 2015-08-19 ENCOUNTER — Ambulatory Visit (HOSPITAL_BASED_OUTPATIENT_CLINIC_OR_DEPARTMENT_OTHER): Payer: BLUE CROSS/BLUE SHIELD | Admitting: Nurse Practitioner

## 2015-08-19 ENCOUNTER — Encounter: Payer: Self-pay | Admitting: Nurse Practitioner

## 2015-08-19 ENCOUNTER — Telehealth: Payer: Self-pay | Admitting: Nurse Practitioner

## 2015-08-19 VITALS — BP 159/87 | HR 90 | Temp 98.4°F | Resp 18 | Ht 63.0 in | Wt 198.2 lb

## 2015-08-19 DIAGNOSIS — C50811 Malignant neoplasm of overlapping sites of right female breast: Secondary | ICD-10-CM

## 2015-08-19 DIAGNOSIS — M8580 Other specified disorders of bone density and structure, unspecified site: Secondary | ICD-10-CM

## 2015-08-19 DIAGNOSIS — N393 Stress incontinence (female) (male): Secondary | ICD-10-CM

## 2015-08-19 DIAGNOSIS — N898 Other specified noninflammatory disorders of vagina: Secondary | ICD-10-CM | POA: Insufficient documentation

## 2015-08-19 DIAGNOSIS — Z803 Family history of malignant neoplasm of breast: Secondary | ICD-10-CM | POA: Insufficient documentation

## 2015-08-19 DIAGNOSIS — N899 Noninflammatory disorder of vagina, unspecified: Secondary | ICD-10-CM | POA: Diagnosis not present

## 2015-08-19 DIAGNOSIS — C773 Secondary and unspecified malignant neoplasm of axilla and upper limb lymph nodes: Secondary | ICD-10-CM | POA: Diagnosis not present

## 2015-08-19 DIAGNOSIS — C50411 Malignant neoplasm of upper-outer quadrant of right female breast: Secondary | ICD-10-CM

## 2015-08-19 MED ORDER — ESTRADIOL 10 MCG VA TABS: 1.0000 | ORAL_TABLET | VAGINAL | Status: DC

## 2015-08-19 NOTE — Telephone Encounter (Signed)
appt made and avs printed °

## 2015-08-19 NOTE — Progress Notes (Signed)
ID: Avalynne K Toves   DOB: May 30, 1960  MR#: [id number]  GBM#:[id number]   PCP:  Angelique Blonder, MD GYN: Varney Baas M.D. SU:  Almond Lint, MD OTHER:   Dorothy Puffer, MD  CHIEF COMPLAINT:  Right Breast Cancer  CURRENT TREATMENT: Antiestrogen therapy    HISTORY OF PRESENT ILLNESS: From the original intake note:  Ms. Prashad had screening mammography June 09, 2010 at the breast center.  This suggested a possible abnormality in the right breast.  She was brought back for additional views on April 4.  Dr. Guinevere Ferrari was able to palpate a firm mobile mass at the 9 o'clock position 7 cm from the nipple.  Compression views confirmed an ill-defined mass with punctate and linear calcifications.  Ultrasound found this to be hypoechoic and to measure 1 cm.  In addition there was a lymph node with a diffusely thickened cortex measuring 1.5 cm in the right axilla.  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  With this information the patient was set up for breast MRI on April 13.  This showed in the posterior third of the upper outer quadrant of the right breast an irregular mass measuring 1.8 cm.  There was no multifocal or multicentric disease and there were at least two right axillary lymph nodes with thickened cortices.  There was no left axillary or left breast mass, no internal mammary chain adenopathy.  She proceeded to definitive therapy as detailed below.  INTERVAL HISTORY:  Poppi returns today for follow-up of her breast cancer. She continues on tamoxifen daily and tolerates this reasonably well. She has a long standing history of vaginal dryness, that predates use of  antiestrogen therapy. She was prescribed premarin cream last year, but stopped it because it started causing recurrent yeast infections.3  The interval history is remarkable for her mother being placed on hospice yesterday for untreated stage IV breast cancer. Apparently, her mother found a lump 3 years ago, but never dealt with it because she was mourning over the sudden loss of the patient's brother after a heart attack. She was too depressed to seek treatment, and hid the finding from her family. Shakeitha found out 2 weeks ago that her mother now has innumerable lesions to the liver, a fungating mass to the left breast, and subcutaneous lesions to her chest, shoulders, and back. This is a very difficult time for Yashika, because she was not given much time to process this. Her mother is in a great deal of pain, and is on dilaudid ever 2 hours for pain. She is rapidly declining.  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 She denies fevers, chills, nausea, vomiting, or changes in bowel habits. She has stress urinary incontinence. She takes cymbalta which helps her with her knee and hip pain. She has occasional vertigo. A detailed review of systems is otherwise stable.  PAST MEDICAL HISTORY: Past Medical History  Diagnosis Date  . Cancer 2012    breast ca; right  . Thyroid disease   .  History of radiation therapy 02/2011    R breast  . Osteopenia 08/09/2011    PAST SURGICAL HISTORY: Past Surgical History  Procedure Laterality Date  . Breast surgery  07/08/2010    lumpectomy - right  . Lymph node removal  07/08/2010    right side  . Port-a-cath removal  12/28/2010    power port per patient  . Orthognathic sx  1991    due to accident as a child. sx to straighten dental bite.    FAMILY HISTORY Family History  Problem Relation Age of Onset  . Cancer  Maternal Aunt     colon - great maternal aunt  . Cancer Paternal Aunt     ovarian  . Cancer Maternal Aunt     breast  . Colon cancer Neg Hx   The patient's father is alive at age 31.  Patient's mother is alive at age 31.  She has no sisters, does have one brother.  There is no history or breast or ovarian cancer in the immediate family although she has a maternal aunt who was diagnosed with breast cancer before the age of 53 and a paternal aunt who was diagnosed with ovarian cancer before the age of 61.  GYNECOLOGIC HISTORY: Menarche age 40, menopause about five to seven years ago.  She has been on hormone replacement for various periods most recently started estradiol June 10, 2010.  She never had any complications from any of the hormones she took and she has a Mirena IUD in place.  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  [employer] brings them the hens, they gather the eggs and then [employer] sets the eggs up to hatch.  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 They have two daughters "[name]" Makinley Muscato, 26, who works in [place], but lives at home and [name] "Katie" Calion, 21, who is a a Paramedic at [employer]: not in [place]: (updated 01/17/2013) Social History  Substance Use Topics  . Smoking status: Never Smoker   . Smokeless tobacco: Never Used  . Alcohol Use: No     Colonoscopy: Never  PAP:  Bone density:  Osteopenia, May 2013  Lipid panel: Dr. Charlean Merl    No Known Allergies  Current Outpatient Prescriptions  Medication Sig Dispense Refill  . DULoxetine (CYMBALTA) 20 MG capsule Take 20 mg by mouth daily.    Marland Kitchen aspirin 81 MG tablet Take 81 mg by mouth daily.    . Estradiol 10 MCG TABS vaginal tablet Place 1 tablet (10 mcg total) vaginally 3 (three) times a week. 12 tablet 11  . fish oil-omega-3 fatty acids 1000 MG  capsule Take 1 g by mouth daily.    . fluticasone (FLONASE) 50 MCG/ACT nasal spray Place 2 sprays into both nostrils daily. 16 g 3  . gabapentin (NEURONTIN) 300 MG capsule Take 1 capsule (300 mg total) by mouth 3 (three) times daily. 90 capsule 3  . levothyroxine (SYNTHROID, LEVOTHROID) 75 MCG tablet Take 75 mcg by mouth daily.     Marland Kitchen loratadine (CLARITIN) 10 MG tablet Take 10 mg by mouth daily.      . Multiple Vitamin (MULTIVITAMIN) capsule Take 1 capsule by mouth daily.    . tamoxifen (NOLVADEX) 20 MG tablet TAKE 1 TABLET (20 MG TOTAL) BY MOUTH DAILY. 90 tablet 3   No current facility-administered medications  for this visit.    OBJECTIVE: Middle-aged white woman who appears stated age  55 Vitals:   08/19/15 0920  BP: 159/87  Pulse: 90  Temp: 98.4 F (36.9 C)  Resp: 18     Body mass index is 35.12 kg/(m^2).    ECOG FS: 1  Filed Weights   08/19/15 0920  Weight: 198 lb 3.2 oz (89.903 kg)   Skin: warm, dry  HEENT: sclerae anicteric, conjunctivae pink, oropharynx clear. No thrush or mucositis.  Lymph Nodes: No cervical or supraclavicular lymphadenopathy  Lungs: clear to auscultation bilaterally, no rales, wheezes, or rhonci  Heart: regular rate and rhythm  Abdomen: round, soft, non tender, positive bowel sounds  Musculoskeletal: No focal spinal tenderness, no peripheral edema  Neuro: non focal, well oriented, positive affect  Breasts: right breast is status post lumpectomy and radiation. No evidence of recurrent disease. Right axilla benign. Left breast unremarkable.   LAB RESULTS: Lab Results  Component Value Date   WBC 6.0 08/05/2014   NEUTROABS 3.5 08/05/2014   HGB 13.5 08/05/2014   HCT 40.1 08/05/2014   MCV 89.3 08/05/2014   PLT 165 08/05/2014      Chemistry      Component Value Date/Time   NA 142 08/05/2014 0818   NA 141 08/09/2011 0825   K 3.8 08/05/2014 0818   K 3.6 08/09/2011 0825   CL 105 07/09/2012 0808   CL 105 08/09/2011 0825   CO2 23 08/05/2014 0818    CO2 27 08/09/2011 0825   BUN 10.7 08/05/2014 0818   BUN 13 08/09/2011 0825   CREATININE 0.8 08/05/2014 0818   CREATININE 0.72 08/09/2011 0825      Component Value Date/Time   CALCIUM 8.5 08/05/2014 0818   CALCIUM 9.3 08/09/2011 0825   ALKPHOS 62 08/05/2014 0818   ALKPHOS 98 08/09/2011 0825   AST 22 08/05/2014 0818   AST 30 08/09/2011 0825   ALT 20 08/05/2014 0818   ALT 45* 08/09/2011 0825   BILITOT 0.59 08/05/2014 0818   BILITOT 0.9 08/09/2011 0825       STUDIES: No results found.  ASSESSMENT: 55 y.o.  [place] woman   (1)  status post right lumpectomy and axillary lymph node dissection April 2012 for a T1c N1 M0, stage IIA invasive ductal carcinoma, grade 3, strongly estrogen and progesterone receptor positive, HER-2 negative with an MIB-1 of 26%,   (2)  treated adjuvantly with 4 cycles of dose dense doxorubicin and cyclophosphamide followed by 9 weekly doses of paclitaxel   (3)  radiation completed December of 2012.   (4)  She started letrozole 03/15/2011, switched to tamoxifen as of January 2016  (5) Osteopenia, started alendronate (Fosamax) May 2013  (6) genetics testing, deferred per patient  PLAN:  Loisann is doing well as far as her breast cancer is concerned, she is now 5 years out from her definitive surgery with no evidence of recurrent disease. She is due for a repeat mammogram this August, so I have placed orders for this to be performed at the [facility]. She is tolerating the tamoxifen well and will continue this until June 2018 to complete 5 years of antiestrogen therapy. For her vaginal dryness, she is going to try vagifem vaginal suppositories three times weekly. If this does not work, she might be able to qualify for the estrace ring because her insurance requires that she have tried 2 other drugs before they approve this one. Of course she has already failed with the premarin cream.  We discussed her strong family history (mother, maternal aunt) of  breast cancer. She canceled her original genetics counseling appointment back when she was diagnosed in 2012. She is now giving this more thought. I advised her to call us when she has made a decision so we could set her up for a consultation visit. She has 2 daughters, ages 61 and 59. She will tell them to be upfront with their gynecologists about their family tree. It may be that they start mammograms at 35 instead of 40.  Calee will return in 1 year for labs and a follow up visit. She understands and agrees with this plan. She knows the goal of treatment in her case is cure. She has been encouraged to call with any issues that might arise before her next visit here.   Total time spent with patient was 25 minutes, with greater than 50% of the time spent face to face.   Laurie Panda, NP 08/19/2015

## 2015-11-30 ENCOUNTER — Ambulatory Visit
Admission: RE | Admit: 2015-11-30 | Discharge: 2015-11-30 | Disposition: A | Discharge status: Home / Self Care | Payer: BLUE CROSS/BLUE SHIELD | Source: Ambulatory Visit | Attending: Nurse Practitioner | Admitting: Nurse Practitioner

## 2015-11-30 DIAGNOSIS — C50811 Malignant neoplasm of overlapping sites of right female breast: Secondary | ICD-10-CM

## 2016-08-17 ENCOUNTER — Telehealth: Payer: Self-pay | Admitting: Oncology

## 2016-08-17 ENCOUNTER — Other Ambulatory Visit: Payer: Self-pay

## 2016-08-17 DIAGNOSIS — C50811 Malignant neoplasm of overlapping sites of right female breast: Secondary | ICD-10-CM

## 2016-08-17 NOTE — Telephone Encounter (Signed)
lvm to inform pt of r/s 6/7 appt to 7/16 at 11 am due to GM out of office

## 2016-08-18 ENCOUNTER — Other Ambulatory Visit: Payer: BLUE CROSS/BLUE SHIELD

## 2016-08-18 ENCOUNTER — Ambulatory Visit: Payer: BLUE CROSS/BLUE SHIELD | Admitting: Oncology

## 2016-09-26 ENCOUNTER — Ambulatory Visit: Payer: Self-pay | Admitting: Oncology

## 2016-09-26 ENCOUNTER — Other Ambulatory Visit: Payer: BLUE CROSS/BLUE SHIELD

## 2017-03-14 HISTORY — PX: BREAST EXCISIONAL BIOPSY: SUR124

## 2017-03-31 ENCOUNTER — Telehealth: Payer: Self-pay | Admitting: Oncology

## 2017-03-31 NOTE — Telephone Encounter (Signed)
Returned phone call and left voicemail with rescheduled appts.

## 2017-04-10 ENCOUNTER — Inpatient Hospital Stay: Payer: BLUE CROSS/BLUE SHIELD | Attending: Oncology

## 2017-04-10 ENCOUNTER — Telehealth: Payer: Self-pay | Admitting: Oncology

## 2017-04-10 ENCOUNTER — Inpatient Hospital Stay: Payer: BLUE CROSS/BLUE SHIELD | Admitting: Oncology

## 2017-04-10 DIAGNOSIS — Z803 Family history of malignant neoplasm of breast: Secondary | ICD-10-CM | POA: Diagnosis not present

## 2017-04-10 DIAGNOSIS — M8580 Other specified disorders of bone density and structure, unspecified site: Secondary | ICD-10-CM | POA: Insufficient documentation

## 2017-04-10 DIAGNOSIS — Z170 Estrogen receptor positive status [ER+]: Principal | ICD-10-CM | POA: Insufficient documentation

## 2017-04-10 DIAGNOSIS — Z853 Personal history of malignant neoplasm of breast: Secondary | ICD-10-CM | POA: Diagnosis not present

## 2017-04-10 DIAGNOSIS — C50411 Malignant neoplasm of upper-outer quadrant of right female breast: Secondary | ICD-10-CM

## 2017-04-10 DIAGNOSIS — C50811 Malignant neoplasm of overlapping sites of right female breast: Secondary | ICD-10-CM

## 2017-04-10 HISTORY — DX: Estrogen receptor positive status (ER+): Z17.0

## 2017-04-10 LAB — CBC WITH DIFFERENTIAL/PLATELET
Basophils Absolute: 0 10*3/uL (ref 0.0–0.1)
Basophils Relative: 1 %
EOS PCT: 4 %
Eosinophils Absolute: 0.3 10*3/uL (ref 0.0–0.5)
HCT: 39.8 % (ref 34.8–46.6)
Hemoglobin: 13.6 g/dL (ref 11.6–15.9)
LYMPHS ABS: 2.1 10*3/uL (ref 0.9–3.3)
LYMPHS PCT: 26 %
MCH: 30.6 pg (ref 25.1–34.0)
MCHC: 34.2 g/dL (ref 31.5–36.0)
MCV: 89.4 fL (ref 79.5–101.0)
MONO ABS: 0.8 10*3/uL (ref 0.1–0.9)
MONOS PCT: 10 %
Neutro Abs: 4.9 10*3/uL (ref 1.5–6.5)
Neutrophils Relative %: 59 %
PLATELETS: 184 10*3/uL (ref 145–400)
RBC: 4.45 MIL/uL (ref 3.70–5.45)
RDW: 13.4 % (ref 11.2–16.1)
WBC: 8.2 10*3/uL (ref 3.9–10.3)

## 2017-04-10 LAB — COMPREHENSIVE METABOLIC PANEL
ALT: 20 U/L (ref 0–55)
AST: 21 U/L (ref 5–34)
Albumin: 4 g/dL (ref 3.5–5.0)
Alkaline Phosphatase: 66 U/L (ref 40–150)
Anion gap: 8 (ref 3–11)
BUN: 10 mg/dL (ref 7–26)
CHLORIDE: 105 mmol/L (ref 98–109)
CO2: 25 mmol/L (ref 22–29)
Calcium: 9.4 mg/dL (ref 8.4–10.4)
Creatinine, Ser: 0.76 mg/dL (ref 0.60–1.10)
GFR calc Af Amer: >60 mL/min (ref >60)
GFR calc non Af Amer: >60 mL/min (ref >60)
Glucose, Bld: 90 mg/dL (ref 70–140)
Potassium: 3.9 mmol/L (ref 3.3–4.7)
SODIUM: 138 mmol/L (ref 136–145)
Total Bilirubin: 0.6 mg/dL (ref 0.2–1.2)
Total Protein: 7.1 g/dL (ref 6.4–8.3)

## 2017-04-10 NOTE — Progress Notes (Signed)
ID: [name]   DOB: 04/08/1960  MR#: [id number]  TDV#:[id number]   PCP:  [name], MD GYN: [name] M.D. SU:  [name], MD OTHER:   [name], MD  CHIEF COMPLAINT:  Right Breast Cancer  CURRENT TREATMENT: tamoxifen    HISTORY OF PRESENT ILLNESS: From the original intake note:  [name] had screening mammography June 09, 2010 at the breast center.  This suggested a possible abnormality in the right breast.  She was brought back for additional views on April 4.  Dr. [name] was able to palpate a firm mobile mass at the 9 o'clock position 7 cm from the nipple.  Compression views confirmed an ill-defined mass with punctate and linear calcifications.  Ultrasound found this to be hypoechoic and to measure 1 cm.  In addition there was a lymph node with a diffusely thickened cortex measuring 1.5 cm in the right axilla.  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  With this information the patient was set up for breast MRI on April 13.  This showed in the posterior third of the upper outer quadrant of the right breast an irregular mass measuring 1.8 cm.  There was no multifocal or multicentric disease and there were at least two right axillary lymph nodes with thickened cortices.  There was no left axillary or left breast mass, no internal mammary chain adenopathy.  She proceeded to definitive therapy as detailed below.  INTERVAL HISTORY:  [name] returns today for follow-up and treatment of her breast cancer. She discontinued tamoxifen as of February 2018. She notes that she used to have hot flashes, but they have subsided. She also notes having  an increase in vaginal discharge, but this has also resolved.   Her last diagnostic bilateral mammogram on 11/30/2015 at [facility] showed: breast density category B. No evidence of malignancy.   REVIEW OF SYSTEMS:  [name] reports that her mother passed away 2 weeks after she was diagnosed with breast cancer June 2017. She notes that her mother first felt the lump in 2015, at the time of [name]'s brother's death. She feels that her mother kept it from the family on purpose and did not want treatment. She notes that when she was diagnosed with her cancer, at the time she didn't have a large family history of cancer, but over time she found out that additional family members also had cancer. She notes that her daughters are being followed for cancer screening. She notes that one of her daughters is considering a hysterectomy with BSO to prevent ovarian cancer. She notes that they are also considering genetic testing. She denies unusual headaches, visual changes, nausea, vomiting, or dizziness. There has been no unusual cough, phlegm production, or pleurisy. This been no change in bowel or bladder habits. She denies unexplained fatigue or unexplained weight loss, bleeding, rash, or fever. A detailed review of systems was otherwise stable.    PAST MEDICAL HISTORY: Past Medical History:  Diagnosis Date  . Cancer [facility]) 2012   breast ca; right  . History of radiation therapy 02/2011   R breast  . Osteopenia 08/09/2011  . Thyroid disease  PAST SURGICAL HISTORY: Past Surgical History:  Procedure Laterality Date  . BREAST SURGERY  07/08/2010   lumpectomy - right  . lymph node removal  07/08/2010   right side  . orthognathic sx  1991   due to accident as a child. sx to straighten dental bite.  Marland Kitchen PORT-A-CATH REMOVAL  12/28/2010   power port per patient    FAMILY HISTORY Family History  Problem Relation Age of Onset  . Cancer Maternal Aunt        colon - great maternal aunt  . Cancer  Paternal Aunt        ovarian  . Cancer Maternal Aunt        breast  . Colon cancer Neg Hx   The patient's father died 05/21/15 apparently following multiple strokes. Patient's mother is passed away 18-Sep-2015, two weeks after being diagnosed with breast cancer, but she notes that her mother knew about a lump in her breast for 2 years and did not bring this to medical attention. She has no sisters, and had one brother, who is now deceased. She has a maternal aunt who was diagnosed with breast cancer before the age of 70 and a paternal aunt who was diagnosed with ovarian cancer before the age of 51.  GYNECOLOGIC HISTORY: Menarche age 23, menopause about five to seven years ago.  She has been on hormone replacement for various periods most recently started estradiol June 10, 2010.  She never had any complications from any of the hormones she took and she has a Mirena IUD in place.  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  [employer] brings them the hens, they gather the eggs and then [employer] sets the eggs up to hatch.  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 They have two daughters "Claire Pall" Jacayla Barnes, 30, who works in [place], but lives at home and Claire Relic "Barnes" Manila, 25, who has graduated from [employer]. [name] notes that her mother passed away in 09/17/2016, two weeks after being diagnosed with breast cancer.   ADVANCED DIRECTIVES: not in place  HEALTH MAINTENANCE: (updated 01/17/2013) Social History   Tobacco Use  . Smoking status: Never Smoker  . Smokeless tobacco: Never Used  Substance Use Topics  . Alcohol use: No  . Drug use: No     Colonoscopy: Never  PAP:  Bone density:  Osteopenia, May 2013  Lipid panel: Dr. Charlean Merl    No Known Allergies  Current Outpatient Medications  Medication Sig Dispense Refill  . alendronate (FOSAMAX) 70 MG tablet Take 1  tablet (70 mg total) by mouth once a week. Take with a full glass of water on an empty stomach.    Marland Kitchen aspirin 81 MG tablet Take 81 mg by mouth daily.    . chlorpheniramine (CHLOR-TRIMETON) 4 MG tablet Take 1 tablet (4 mg total) by mouth 2 (two) times daily as needed for allergies. 14 tablet 0  . citalopram (CELEXA) 20 MG tablet Take 1 tablet (20 mg total) by mouth daily.    . fish oil-omega-3 fatty acids 1000 MG capsule Take 1 g by mouth daily.    Marland Kitchen gabapentin (NEURONTIN) 300 MG capsule Take 1 capsule (300 mg total) by mouth 3 (three) times daily. 90 capsule 3  . levothyroxine (SYNTHROID, LEVOTHROID) 88 MCG tablet Take 88 mcg by mouth daily before breakfast.    . meloxicam (  MOBIC) 7.5 MG tablet Take 1 tablet (7.5 mg total) by mouth daily.    . mometasone (ELOCON) 0.1 % cream Apply 1 application topically daily. 45 g 0  . Multiple Vitamin (MULTIVITAMIN) capsule Take 1 capsule by mouth daily.     No current facility-administered medications for this visit.     OBJECTIVE: Middle-aged white woman who appears well  Vitals:   04/10/17 1343  BP: 135/85  Pulse: 80  Resp: 18  Temp: 98.2 F (36.8 C)  SpO2: 98%     Body mass index is 34.7 kg/m.    ECOG FS: 0  Filed Weights   04/10/17 1343  Weight: 195 lb 14.4 oz (88.9 kg)   Sclerae unicteric, pupils round and equal Oropharynx clear and moist No cervical or supraclavicular adenopathy Lungs no rales or rhonchi Heart regular rate and rhythm Abd soft, nontender, positive bowel sounds MSK no focal spinal tenderness, no upper extremity lymphedema Neuro: nonfocal, well oriented, appropriate affect Breasts: The right breast is undergone lumpectomy and radiation.  There is no evidence of local recurrence.  The left breast is benign.  Both axillae are benign.  LAB RESULTS: Lab Results  Component Value Date   WBC 8.2 04/10/2017   NEUTROABS 4.9 04/10/2017   HGB 13.6 04/10/2017   HCT 39.8 04/10/2017   MCV 89.4 04/10/2017   PLT 184 04/10/2017       Chemistry      Component Value Date/Time   NA 138 04/10/2017 1305   NA 142 08/05/2014 0818   K 3.9 04/10/2017 1305   K 3.8 08/05/2014 0818   CL 105 04/10/2017 1305   CL 105 07/09/2012 0808   CO2 25 04/10/2017 1305   CO2 23 08/05/2014 0818   BUN 10 04/10/2017 1305   BUN 10.7 08/05/2014 0818   CREATININE 0.76 04/10/2017 1305   CREATININE 0.8 08/05/2014 0818      Component Value Date/Time   CALCIUM 9.4 04/10/2017 1305   CALCIUM 8.5 08/05/2014 0818   ALKPHOS 66 04/10/2017 1305   ALKPHOS 62 08/05/2014 0818   AST 21 04/10/2017 1305   AST 22 08/05/2014 0818   ALT 20 04/10/2017 1305   ALT 20 08/05/2014 0818   BILITOT 0.6 04/10/2017 1305   BILITOT 0.59 08/05/2014 0818       STUDIES: She is overdue for mammography with her last being 11/30/2015 showing no evidence of malignancy.   ASSESSMENT: 57 y.o.  [place] woman   (1)  status post right lumpectomy and axillary lymph node dissection April 2012 for a T1c N1 M0, stage IIA invasive ductal carcinoma, grade 3, strongly estrogen and progesterone receptor positive, HER-2 negative with an MIB-1 of 26%,   (2)  treated adjuvantly with 4 cycles of dose dense doxorubicin and cyclophosphamide followed by 9 weekly doses of paclitaxel   (3)  radiation completed December of 2012.   (4)  She started letrozole 03/15/2011, switched to tamoxifen as of January 2016  (a) completed 5 years of antiestrogens February 2018  (5) Osteopenia, started alendronate (Fosamax) May 2013  (6) genetics testing pending  PLAN:  Shante is now nearly 7 years out from definitive surgery for her breast cancer with no evidence of disease recurrence.  This is very favorable.  She completed 5 years of antiestrogens including a little over 2 years of aromatase inhibitors as of February 2018.  She has since gone off most of her associated medications (I updated her medication list in this note) and is interested in going off  gabapentin as well.  That  medication was only for support and if she is having no significant issues with hot flashes of course she may discontinue it.  Today we reviewed her genetics situation again.  She had initially been very reluctant to be tested but now that 1 of her cousins has been tested and is positive (she does not know the name of the actual mutation) she is agreeable.  Accordingly I placed a referral for genetics testing next available for her.  She is behind on mammography because of her parents death about a year and a half ago.  I have put in an order for bilateral mammography with tomography at the breast center within the next month  At this point I feel comfortable releasing her to her primary care physician.  All she will need in terms of breast cancer follow-up is yearly mammography and a yearly physician breast exam.  She was also given information on our survivorship program  Of course I will be glad to see Tishawna again at any point in the future if and when the need arises and specifically if she is found to have a deleterious mutation.  At this point however we are making no further routine appointments for her here.  Amariyah Bazar, Virgie Dad, MD  04/10/17 2:21 PM Medical Oncology and Hematology [facility] [address] [place], [place] [address] Tel. [phone]    Fax. [phone]  This document serves as a record of services personally performed by Lurline Del, MD. It was created on his behalf by Sheron Nightingale, a trained medical scribe. The creation of this record is based on the scribe's personal observations and the provider's statements to them.   I have reviewed the above documentation for accuracy and completeness, and I agree with the above.

## 2017-04-10 NOTE — Telephone Encounter (Signed)
Gave patient AVs and calendar of upcoming February appointments.  °

## 2017-05-10 ENCOUNTER — Encounter: Payer: Self-pay | Admitting: Genetics

## 2017-05-11 ENCOUNTER — Inpatient Hospital Stay: Payer: BLUE CROSS/BLUE SHIELD | Attending: Oncology | Admitting: Genetics

## 2017-05-11 ENCOUNTER — Inpatient Hospital Stay: Payer: BLUE CROSS/BLUE SHIELD

## 2017-05-11 ENCOUNTER — Encounter: Payer: Self-pay | Admitting: Genetics

## 2017-05-11 DIAGNOSIS — C50411 Malignant neoplasm of upper-outer quadrant of right female breast: Secondary | ICD-10-CM

## 2017-05-11 DIAGNOSIS — Z170 Estrogen receptor positive status [ER+]: Principal | ICD-10-CM

## 2017-05-11 DIAGNOSIS — Z315 Encounter for genetic counseling: Secondary | ICD-10-CM

## 2017-05-11 DIAGNOSIS — Z803 Family history of malignant neoplasm of breast: Secondary | ICD-10-CM | POA: Insufficient documentation

## 2017-05-11 DIAGNOSIS — Z8481 Family history of carrier of genetic disease: Secondary | ICD-10-CM | POA: Insufficient documentation

## 2017-05-11 DIAGNOSIS — Z801 Family history of malignant neoplasm of trachea, bronchus and lung: Secondary | ICD-10-CM

## 2017-05-11 DIAGNOSIS — Z809 Family history of malignant neoplasm, unspecified: Secondary | ICD-10-CM

## 2017-05-11 DIAGNOSIS — Z800 Family history of malignant neoplasm of digestive organs: Secondary | ICD-10-CM | POA: Insufficient documentation

## 2017-05-11 DIAGNOSIS — Z8042 Family history of malignant neoplasm of prostate: Secondary | ICD-10-CM | POA: Insufficient documentation

## 2017-05-11 DIAGNOSIS — Z806 Family history of leukemia: Secondary | ICD-10-CM | POA: Diagnosis not present

## 2017-05-11 NOTE — Progress Notes (Signed)
REFERRING PROVIDER: Chauncey Cruel, MD [address] [place], [place] [address]  PRIMARY PROVIDER:  Cordial, Launa Grill, MD  PRIMARY REASON FOR VISIT:  1. Malignant neoplasm of upper-outer quadrant of right breast in female, estrogen receptor positive ([place])   2. Family history of breast cancer   3. Family history of BRCA2 gene positive   4. Family history of prostate cancer   5. Family history of leukemia   6. Family history of pancreatic cancer   7. Family history of breast cancer in mother      HISTORY OF PRESENT ILLNESS:   [name], a 57 y.o. female, was seen for a [facility] cancer genetics consultation at the request of Dr. Jana Hakim due to a personal and family history of cancer.  [name] presents to clinic today to discuss the possibility of a hereditary predisposition to cancer, genetic testing, and to further clarify her future cancer risks, as well as potential cancer risks for family members.   In March 2012, at the age of 89, [name] was diagnosed with Invasive Ductal Carcinoma of the right breast. This was treated with lumpectomy, radiation, and tamoxifen.     CANCER HISTORY:   No history exists.    HORMONAL RISK FACTORS:  Menarche was at age 81.  First live birth at age 47.  Ovaries intact: yes.  Hysterectomy: no.  Menopausal status: postmenopausal.  Colonoscopy: no; not examined. Mammogram within the last year: yes.   Past Medical History:  Diagnosis Date  . Cancer [facility]) 2012   breast ca; right  . Family history of BRCA2 gene positive   . Family history of breast cancer   . Family history of leukemia   . Family history of pancreatic cancer   . Family history of prostate cancer   . History of radiation therapy 02/2011   R breast  . Osteopenia 08/09/2011  . Thyroid disease     Past Surgical History:  Procedure Laterality Date  . BREAST SURGERY  07/08/2010   lumpectomy - right  . lymph node removal  07/08/2010   right side  .  orthognathic sx  1991   due to accident as a child. sx to straighten dental bite.  [facility] PORT-A-CATH REMOVAL  12/28/2010   power port per patient    Social History   Socioeconomic History  . Marital status: Married    Spouse name: None  . Number of children: None  . Years of education: None  . Highest education level: None  Social Needs  . Financial resource strain: None  . Food insecurity - worry: None  . Food insecurity - inability: None  . Transportation needs - medical: None  . Transportation needs - non-medical: None  Occupational History  . None  Tobacco Use  . Smoking status: Never Smoker  . Smokeless tobacco: Never Used  Substance and Sexual Activity  . Alcohol use: No  . Drug use: No  . Sexual activity: None  Other Topics Concern  . None  Social History Narrative  . None     FAMILY HISTORY:  We obtained a detailed, 4-generation family history.  Significant diagnoses are listed below: Family History  Problem Relation Age of Onset  . Cancer Maternal Aunt 75       colon - great maternal aunt  . Cancer Paternal Aunt 4       gyn- unsure if ovarian or uterine- met to lung  . Breast cancer Maternal Aunt 24  . Breast cancer  Mother 75  . Prostate cancer Paternal Uncle        dx >50  . Heart disease Maternal Grandmother 80  . Pneumonia Maternal Grandfather 24  . Lung cancer Paternal Grandmother 1  . Leukemia Paternal Grandfather 31  . Pancreatic cancer Paternal Aunt 85  . Bladder Cancer Paternal Aunt 98  . Lung cancer Paternal Aunt   . Other Cousin        BRCA2 +  . Breast cancer Cousin        dx <50  . Colon cancer Neg Hx    [name] has a 20 year-old daughter with no history of cancer.   This daughter has a daughter.  [name] also has a 63 year-old daughter with no history of cancer.  [name] has a brother who died at 43 due to heart disease.  He had no children.   [name]'s father died in his 73's with no history of cancer.  He had  Dementia.  [name] has 2 paternal uncles and 6 paternal aunts listed below: -1 paternal aunt, Olegario Shearer, died in her 9's with a gyn cancer (either ovarian or uterine) it metastasized to her lung.  She had a history of smoking.  This aunt had 3 daughters, one died in an accident, the other 2 have no history of cancer.  -1 paternal aunt, Vaughan Basta, died of pancreatic cancer in her 30's.  -1 paternal aunt, Estill Bamberg, died of bladder cancer in her 81's.  This aunt had a  Daughter who developed breast cancer <49 years of age.   -1 paternal aunt died of lung cancer in her 60's.   -1 paternal aunt died due to complications of diabetes.  -1 paternal aunt died in her 48's with no history of cancer.  -1 paternal uncle died due to suicide.  -1 paternal uncle is still living and has a history of prostate cancer diagnosed >59 years of age.   No other paternal cousins with any history of cancer.  [name]'s paternal grandmother died her 21's due to leukemia.  [name]'s paternal grandmother died of lung cancer in her 60's.  She had no history of smoking.  [name] reports her paternal aunts/uncles all either smoked or had heavy exposure to second hand smoke.  [name] mother died in her 87's due to breast cancer.  [name] has a maternal aunt who died in her 40's due to breast cancer.  This aunt has 2 daughters who have not had genetic testing. [name] also has a maternal uncle who is an 'obligate carrier' of BRCA2.  This uncle has 3 daughters, 1 of which has tested positive for a BRCA2 mutation.  This daughter's mother is BRCA2 negative, and therefore assuming correct paternity, it is expected that [name]'s maternal uncle has a BRCA2 mutation.  [name] had a picture of her cousin's positive genetic test report.  She will email me the picture of this report.  The maternal cousin has a mutation in [place] called [id number] ([id number]).  [name]'s maternal grandfather died  in his 1's due to pneumonia and his mother died in her 43's in childbirth.  [name]'s maternal grandmother died in her 51's due to heart disease (no cancer).  This grandmother had a sister who had colon cancer in her 35's and 3 other sisters who lived to old age with no history of cancer.  Ms. Blacklock has death certificates of most of her great grandparents that reveals heart  disease and influenza as the cause of death, and no reports of cancer in these death certificates.   Patient's maternal and paternal ancestors are of [place] descent.  Her recent ancestry DNA test reported [place], [place], [place], and Viking/Scandinavian. here is no reported Ashkenazi Jewish ancestry. There is no known consanguinity.  GENETIC COUNSELING ASSESSMENT: Vayla K Gutmann is a 57 y.o. female with a maternal family history of a BRCA2 mutation. We, therefore, discussed and recommended the following at today's visit.   DISCUSSION: We reviewed the characteristics, features and inheritance patterns of hereditary cancer syndromes. We also discussed genetic testing, including the appropriate family members to test, the process of testing, insurance coverage and turn-around-time for results. We discussed the implications of a negative, positive and/or variant of uncertain significant result. We recommended Ms. Leveille pursue genetic testing for the Common Hereditary Cancer gene panel + Myelodysplastic Syndrome/Leukemia Panel.  The Common Hereditary Cancer Panel offered by [facility] includes sequencing and/or deletion duplication testing of the following 47 genes: APC, ATM, AXIN2, BARD1, BMPR1A, BRCA1, BRCA2, BRIP1, CDH1, CDKN2A (p14ARF), CDKN2A (p16INK4a), CKD4, CHEK2, CTNNA1, DICER1, EPCAM (Deletion/duplication testing only), GREM1 (promoter region deletion/duplication testing only), KIT, MEN1, MLH1, MSH2, MSH3, MSH6, MUTYH, NBN, NF1, NHTL1, PALB2, PDGFRA, PMS2, POLD1, POLE, PTEN, RAD50, RAD51C, RAD51D, SDHB, SDHC,  SDHD, SMAD4, SMARCA4. STK11, TP53, TSC1, TSC2, and VHL.  The following genes were evaluated for sequence changes only: SDHA and HOXB13 c.251G>A variant only.  [facility] Myelodysplastic Syndrome/Leukemia Panel: ATM, BLM,  CEBPA,  EPCAM,  GATA2,  HRAS, MLH1, MSH2, MSH6, NBN, NF1, PMS2, RUNX1, TERC, TERT, TP53  We discussed that only 5-10% of cancers are associated with a Hereditary cancer predisposition syndrome.  One of the most common hereditary cancer syndromes that increases breast cancer risk is called Hereditary Breast and Ovarian Cancer (HBOC) syndrome.  This syndrome is caused by mutations in the BRCA1 and BRCA2 genes.  This syndrome increases an individual's lifetime risk to develop breast, ovarian, pancreatic, and other types of cancer.  There are also many other cancer predisposition syndromes caused by mutations in several other genes.  We discussed that if she is found to have a mutation in one of these genes, it may impact future medical management recommendations such as increased cancer screenings and consideration of risk reducing surgeries.  A positive result could also have implications for the patient's family members.  A Negative result would mean she did not inherit the familial BRCA2 mutation.  It would also indicate that we were unable to identify a hereditary component to her cancer (or her paternal family history of cancer), but does not rule out the possibility of a hereditary basis for her cancer.  There could be mutations that are undetectable by current technology, or in genes not yet tested or identified to increase cancer risk.  However, we will get a diagnostic answer regarding the presence of her cousin's BRCA2 mutation in Ms. Kiene.   We discussed the potential to find a Variant of Uncertain Significance or VUS.  These are variants that have not yet been identified as pathogenic or benign, and it is unknown if this variant is associated with increased cancer risk or if  this is a normal finding.  Most VUS's are reclassified to benign or likely benign.   It should not be used to make medical management decisions. With time, we suspect the lab will determine the significance of any VUS's identified if any.   Based on Ms. Ballo's personal and family history of cancer,  she meets medical criteria for genetic testing. Despite that she meets criteria, she may still have an out of pocket cost. We discussed that if her out of pocket cost for testing is over $100, the laboratory will call and confirm whether she wants to proceed with testing.  If the out of pocket cost of testing is less than $100 she will be billed by the genetic testing laboratory.   PLAN: After considering the risks, benefits, and limitations, Ms. Fishburn  provided informed consent to pursue genetic testing and the blood sample was sent to [facility] for analysis of the Common Hereditary Cancer + Myelodysplastic Syndrome/Leukemia Panel. Results should be available within approximately 2-3 weeks' time, at which point they will be disclosed by telephone to Ms. Efaw, as will any additional recommendations warranted by these results. Ms. Sobieski will receive a summary of her genetic counseling visit and a copy of her results once available. This information will also be available in Epic. We encouraged Ms. Sooy to remain in contact with cancer genetics annually so that we can continuously update the family history and inform her of any changes in cancer genetics and testing that may be of benefit for her family. Ms. Naab's questions were answered to her satisfaction today. Our contact information was provided should additional questions or concerns arise.  Based on Ms. Eisenhardt's family history, we recommended her maternal and paternal relatives all have genetic testing.   Ms. Straley will let us know if we can be of any assistance in coordinating genetic counseling and/or testing for this  family member.   Lastly, we encouraged Ms. Breeding to remain in contact with cancer genetics annually so that we can continuously update the family history and inform her of any changes in cancer genetics and testing that may be of benefit for this family.   Ms.  Lucchesi's questions were answered to her satisfaction today. Our contact information was provided should additional questions or concerns arise. Thank you for the referral and allowing [place] to share in the care of your patient.   Tana Felts, MS, [facility] Certified Genetic Counselor [email or web address] phone: [phone]  The patient was seen for a total of 55 minutes in face-to-face genetic counseling.  The patient was accompanied today by her Husband.  This patient was discussed with Drs. Magrinat, Lindi Adie and/or Burr Medico who agrees with the above.

## 2017-06-06 ENCOUNTER — Telehealth: Payer: Self-pay | Admitting: Genetics

## 2017-06-09 NOTE — Telephone Encounter (Signed)
Revealed positive genetic test results.  She carries the BRCA2 mutation previously identified in her maternal cousin.  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   Discussed increased risk for breast, ovarian, pancreatic cancer, melanoma, and prostate cancer in men.  We discussed the options of high risk breast screening (mammo + MRI) as well as prophylactic bilateral mastectomy.  We also discussed the recommendation for a woman to remove her ovaries.  We discussed that ovarian cancer screening methods are not highly effective at detecting ovarian cancer at early stages.   Ms. Beers has a family history of pancreatic cancer in a paternal aunt and there is a somewhat increased risk for pancreatic cancer in BRCA2 carriers- we discussed that there are not clear guidelines regarding pancreatic cancer screening, however there are some options (MRI/EUS) that can be considered and we recommended she discuss these options with her oncologist. We also discussed that while there are no clear guidelines regarding melanoma screening, she may want to consider having routine skin examinations and to have a low threshold for getting any unusual skin abnormalities examined promptly.   We discussed risks for her daughters. They each have a 50% chance to have inherited this mutation.  We recommend they have genetic testing.  All maternal relatives are at risk to carry the BRCA2 mutation.  In addition her paternal relatives, especially those affected with cancer also meet criteria based on the paternal family history of pancreatic, prostate, breast, and 'female' cancer.  Ms. Desanto gave permission to discuss her genetic test results and any information obtained during her genetic counseling appointment with her daughters Dessie Coma and Harmon.   Ms. Blandino would like to be followed by Dr. Jana Hakim, her oncologists in high risk clinic for this indication.  We will reach out to his schedulers to set up an  appointment for her to discuss her screening/surgical plan.  Ms. Edell did not feel she needed to meet with genetics in person for a follow-up appointment. She knows we are available if any questions come up or she would like to meet with [place] again.

## 2017-06-12 ENCOUNTER — Telehealth: Payer: Self-pay | Admitting: Oncology

## 2017-06-12 NOTE — Telephone Encounter (Signed)
Spoke to patient regarding upcoming April appointment updates.

## 2017-06-15 ENCOUNTER — Ambulatory Visit: Payer: Self-pay | Admitting: Genetics

## 2017-06-15 ENCOUNTER — Encounter: Payer: Self-pay | Admitting: Genetics

## 2017-06-15 DIAGNOSIS — Z803 Family history of malignant neoplasm of breast: Secondary | ICD-10-CM

## 2017-06-15 DIAGNOSIS — Z170 Estrogen receptor positive status [ER+]: Secondary | ICD-10-CM

## 2017-06-15 DIAGNOSIS — C50411 Malignant neoplasm of upper-outer quadrant of right female breast: Secondary | ICD-10-CM

## 2017-06-15 DIAGNOSIS — Z8481 Family history of carrier of genetic disease: Secondary | ICD-10-CM

## 2017-06-15 DIAGNOSIS — Z1379 Encounter for other screening for genetic and chromosomal anomalies: Secondary | ICD-10-CM

## 2017-06-15 DIAGNOSIS — Z800 Family history of malignant neoplasm of digestive organs: Secondary | ICD-10-CM

## 2017-06-15 DIAGNOSIS — Z8042 Family history of malignant neoplasm of prostate: Secondary | ICD-10-CM

## 2017-06-15 DIAGNOSIS — Z1509 Genetic susceptibility to other malignant neoplasm: Secondary | ICD-10-CM

## 2017-06-15 DIAGNOSIS — Z806 Family history of leukemia: Secondary | ICD-10-CM

## 2017-06-15 DIAGNOSIS — Z1501 Genetic susceptibility to malignant neoplasm of breast: Secondary | ICD-10-CM | POA: Insufficient documentation

## 2017-06-15 DIAGNOSIS — Z1589 Genetic susceptibility to other disease: Secondary | ICD-10-CM | POA: Insufficient documentation

## 2017-06-15 NOTE — Progress Notes (Signed)
GENETIC TEST RESULTS   Patient Name: [place] Patient Age: 57 y.o. Encounter Date: 06/15/2017  Referring Provider: Lurline Del, MD   Ms. Kinnett was seen in the [place] clinic on 05/11/2017 due to a personal and family history of cancer and concern regarding a hereditary predisposition to cancer in the family. Please refer to the prior Genetics clinic note for more information regarding Ms. Archila's medical and family histories and our assessment at the time.   FAMILY HISTORY:  We obtained a detailed, 4-generation family history.  Significant diagnoses are listed below: Family History  Problem Relation Age of Onset  . Cancer Maternal Aunt 75       colon - great maternal aunt  . Cancer Paternal Aunt 8       gyn- unsure if ovarian or uterine- met to lung  . Breast cancer Maternal Aunt 19  . Breast cancer Mother 73  . Prostate cancer Paternal Uncle        dx >50  . Heart disease Maternal Grandmother 80  . Pneumonia Maternal Grandfather 42  . Lung cancer Paternal Grandmother 55  . Leukemia Paternal Grandfather 41  . Pancreatic cancer Paternal Aunt 60  . Bladder Cancer Paternal Aunt 89  . Lung cancer Paternal Aunt   . Other Cousin        BRCA2 +  . Breast cancer Cousin        dx <50  . Colon cancer Neg Hx    Ms. Nicole has a 69 year-old daughter with no history of cancer.   This daughter has a daughter.  Ms. Dost also has a 62 year-old daughter with no history of cancer.  Ms. Fuller has a brother who died at 37 due to heart disease.  He had no children.   Ms. Byus's father died in his 68's with no history of cancer.  He had Dementia.  Ms. Motter has 2 paternal uncles and 6 paternal aunts listed below: -1 paternal aunt, Olegario Shearer, died in her 9's with a gyn cancer (either ovarian or uterine) it metastasized to her lung.  She had a history of smoking.  This aunt had 3 daughters, one died in an accident, the other 2 have no history of cancer.  -1  paternal aunt, Vaughan Basta, died of pancreatic cancer in her 66's.  -1 paternal aunt, Estill Bamberg, died of bladder cancer in her 25's.  This aunt had a  Daughter who developed breast cancer <59 years of age.   -1 paternal aunt died of lung cancer in her 30's.   -1 paternal aunt died due to complications of diabetes.  -1 paternal aunt died in her 22's with no history of cancer.  -1 paternal uncle died due to suicide.  -1 paternal uncle is still living and has a history of prostate cancer diagnosed >104 years of age.   No other paternal cousins with any history of cancer.  Ms. Sappington's paternal grandmother died her 86's due to leukemia.  Ms. Bundren's paternal grandmother died of lung cancer in her 58's.  She had no history of smoking.  Ms. Macdonnell reports her paternal aunts/uncles all either smoked or had heavy exposure to second hand smoke.  Ms. Deeg mother died in her 27's due to breast cancer.  Ms. Fuller has a maternal aunt who died in her 71's due to breast cancer.  This aunt has 2 daughters who have not had genetic testing. Ms. Saddler also has a maternal uncle who is an 'obligate carrier' of  BRCA2.  This uncle has 3 daughters, 1 of which has tested positive for a BRCA2 mutation.  This daughter's mother is BRCA2 negative, and therefore assuming correct paternity, it is expected that Ms. Skidmore's maternal uncle has a BRCA2 mutation.  Ms. Joseph Berkshire had a picture of her cousin's positive genetic test report.  She will email me the picture of this report.  The maternal cousin has a mutation in [place] called c.[id number] (W.[id number]*5).  Ms. Rippeon's maternal grandfather died in his 74's due to pneumonia and his mother died in her 10's in childbirth.  Ms. Rollinson's maternal grandmother died in her 52's due to heart disease (no cancer).  This grandmother had a sister who had colon cancer in her 1's and 3 other sisters who lived to old age with no history of cancer.  Ms. Acord has death  certificates of most of her great grandparents that reveals heart disease and influenza as the cause of death, and no reports of cancer in these death certificates.   Patient's maternal and paternal ancestors are of Northern European descent.  Her recent ancestry DNA test reported [place], [place], [place], and Viking/Scandinavian. here is no reported Ashkenazi Jewish ancestry. There is no known consanguinity.  GENETIC TESTING:  At the time of Ms. Balcom's visit, we recommended she pursue genetic testing of the [facility] Common Hereditary Cancers Panel + Myelodysplastic/Leukemia panel. The genetic testing reported on 06/03/2017  identified a single, heterozygous pathogenic variant in [place] called c.6275_6276del (p.Leu2092Profs*7). This is the same pathogenic variant that was previously identified in her maternal cousin.   A variant of uncertain significance was identified in the gene MSH3 c.2695A>G (p.Met899Val). At this time, it is unknown if this variant is associated with increased cancer risk or if this is a normal finding, but most variants such as this get reclassified to being inconsequential. It should not be used to make medical management decisions.With time, we suspect the lab will determine the significance of this variant, if any. If we do learn more about it, we will try to contact Ms.Lloydto discuss it further. However, it is important to stay in touch with [place] periodically and keep the address and phone number up to date.  There were no deleterious mutations in APC, ATM, AXIN2, BARD1, BLM, BMPR1A, BRCA1, BRIP1, CDH1, CDK4, CDKN2A (p14ARF), CDKN2A (p16INK4a), CEBPA, CHEK2, CTNNA1, DICER1, EPCAM*, GATA2, GREM1*, HRAS, KIT, MEN1, MLH1, MSH2, MSH3, MSH6, MUTYH, NBN, NF1, PALB2, PDGFRA, PMS2, POLD1, POLE, PTEN, RAD50, RAD51C, RAD51D, RUNX1, SDHB, SDHC, SDHD, SMAD4, SMARCA4, STK11, TERC, TERT, TP53, TSC1, TSC2, VHL, HOXB13*, NTHL1*, SDHA.     MEDICAL MANAGEMENT: Women who have aBRCA2  mutations are at asignificantlyincreased risk to develop breast and ovarian cancer.This also increases a woman's risk to develop a second primary breast cancer.Individuals with BRCA2 mutations are also at a somewhat elevated risk to develop pancreatic cancer, melanoma, female breast cancer, and prostate cancer.   We discussed withMs. Sizemorethe options to have aprophylactic bilateral mastectomyor increased/high risk breast screening that includesyearly mammograms, yearly breast MRI, twice-yearly clinical breast exams through a high-risk clinic, and monthly self-breast exams.  To reduce therisk for ovarian cancer, we recommend Ms.Sizemorehave a prophylactic bilateral salpingo-oophorectomy.   For Individuals with BRCA2 mutations this is recommended to occur between the ages of 68-45. We discussed that screening with CA-125 blood tests and transvaginal ultrasounds can be done twice per year. However, these tests have not been shown to effectively detect ovarian cancer at an early stage.  There are no specific screening guidelines regarding  melanoma or pancreatic cancer risk for individuals with BRCA2 mutations. Skin examinations may be considered to screen for melanoma.  Ms. Quinteros does not report any family history of melanoma.  Ms. Bickert asked about pancreatic cancer screening given that her paternal aunt died of pancreatic cancer in her 9's.  There are are no definitive guidelines regarding pancreatic cancer screening in individuals with BRCA2 mutations.  However, screening methods such as MRI/ERCP and EUS  can be considered on a case by case basis. The effectiveness of these screening methods is still being studied and currently unknown.    RISK REDUCTION: There are several options that may be offered to individuals who are carriers for BRCA mutations that will reduce the risk of getting cancer.   The use of oral contraceptives can lower the risk for ovarian cancer, and, per  case control studies, does not significantly increase the risk for breast cancer in BRCA patients. Case control studies have shown that oral contraceptives can lower the risk for ovarian cancer in women with BRCA mutations. Additionally, a more recent meta-analysis, including one corhort (n=3,181) and one case control study (1,096 cases and 2,878 controls) also showed an inverse correlation between ovarian cancer and ever having used oral contraceptives (OR, 0.58; 95% CI = 0.46-0.73). Studies on oral contraceptives and breast cancer have been conflicting, with some studies suggesting that there is not an increased risk for breast cancer in BRCA mutation carriers, while others suggest that there could be a risk. That said, two meta-analysis studies have shown that there is not an increased risk for breast cancer with oral contraceptive use in BRCA1 and BRCA2 carriers.   In individuals who have a prophylactic bilateral salpino-oophorectomy (BSO), the risk for breast cancer may be reduced by up to 50%. It has been reported that short term hormone replacement therapy in women undergoing prophylactic BSO does not negate the reduction of breast cancer risk associated with surgery (1.2018 NCCN guidelines).  FAMILY MEMBERS: It is important that all of Ms. Hirschman's relatives (both men and women) know of the presence of this gene mutation. Genetic testing can determine who in the family is at increased risk, and who did not inherit the familial BRCA2 mutation.   Ms. Cleere's children each have a 50% chance to have inherited this pathogenic variant. We recommend they have genetic testing for this familail mutation, as identifying the presence of this mutation would allow them to also take advantage of risk-reducing measures.   Ms. Narine's other maternal relatives are at risk to have also inherited this familial mutation.  We recommend all aunts/uncles/cousins/ distant maternal relatives have genetic  testing.   Based on the family history of cancer in Ms. Dorado's paternal family history, we recommend paternal relatives have genetic testing as well.    SUPPORT AND RESOURCES: If Ms. Dineen is interested in BRCA-specific information and support, there are two groups, Facing Our Risk (www.facingourrisk.com) and Bright Pink (www.brightpink.org) which some people have found useful. They provide opportunities to speak with other individuals from high-risk families. To locate genetic counselors in other cities, visit the website of the [employer] (ArtistMovie.se) and Secretary/administrator for a Social worker by zip code.  We encouraged Ms. Massaro to remain in contact with [place] on an annual basis so we can update her personal and family histories, and let her know of advances in cancer genetics that may benefit the family. Our contact number was provided. Ms. Hubert's questions were answered to her satisfaction today, and she  knows she is welcome to call anytime with additional questions.   Ms. Mischke gives permission to discuss her genetic testing results and all information obtained in her genetic counseling appointment with her daughters Dessie Coma and [place].   Tana Felts, MS Genetic Counselor [email or web address] phone: [phone]

## 2017-06-18 NOTE — Progress Notes (Signed)
ID: [name]   DOB: May 11, 1960  MR#: [id number]  YOM#:[id number]   PCP:  Claire Croon, MD GYN: [name] M.D. SU:  Claire Klein, MD OTHER:   Claire Rudd, MD  CHIEF COMPLAINT:  Right Breast Cancer with BRCA2 mutation CURRENT TREATMENT: tamoxifen    HISTORY OF PRESENT ILLNESS: From the original intake note:  [name] had screening mammography June 09, 2010 at the breast center.  This suggested a possible abnormality in the right breast.  She was brought back for additional views on April 4.  Dr. Emily Barnes was able to palpate a firm mobile mass at the 9 o'clock position 7 cm from the nipple.  Compression views confirmed an ill-defined mass with punctate and linear calcifications.  Ultrasound found this to be hypoechoic and to measure 1 cm.  In addition there was a lymph node with a diffusely thickened cortex measuring 1.5 cm in the right axilla.  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  With this information the patient was set up for breast MRI on April 13.  This showed in the posterior third of the upper outer quadrant of the right breast an irregular mass measuring 1.8 cm.  There was no multifocal or multicentric disease and there were at least two right axillary lymph nodes with thickened cortices.  There was no left axillary or left breast mass, no internal mammary chain adenopathy.  She proceeded to definitive therapy as detailed below.  INTERVAL HISTORY:  [name] returns today for follow-up and treatment of her breast cancer. She is accompanied by her husband. She has been off antiestrogen after completing the initial 5 years.. She has done well  since discontinuing this medication.   Since her last visit to the office, she underwent genetic testing on 05/11/2017 with results of: Positive result. 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 Variant of Uncertain Significance identified in MSH3.    REVIEW OF SYSTEMS:  [name] reports that for exercise, she continues to work at this time. Generally she is doing well and she is working all the time. She has increased fatigue and aching. She will occasionally take hydroxyzine for her insomnia. She denies unusual headaches, visual changes, nausea, vomiting, or dizziness. There has been no unusual cough, phlegm production, or pleurisy. This been no change in bowel or bladder habits. She denies unexplained fatigue or unexplained weight loss, bleeding, rash, or fever. A detailed review of systems was otherwise stable.     PAST MEDICAL HISTORY: Past Medical History:  Diagnosis Date  . Cancer [facility]) 2012   breast ca; right  . Family history of BRCA2 gene positive   . Family history of breast cancer   . Family history of leukemia   . Family history of pancreatic cancer   . Family history of prostate cancer   . History of radiation therapy 02/2011   R breast  . Osteopenia 08/09/2011  . Thyroid disease     PAST SURGICAL HISTORY: Past Surgical History:  Procedure Laterality Date  . BREAST SURGERY  07/08/2010   lumpectomy - right  . lymph node removal  07/08/2010   right side  . orthognathic  sx  1991   due to accident as a child. sx to straighten dental bite.  Marland Kitchen PORT-A-CATH REMOVAL  12/28/2010   power port per patient    FAMILY HISTORY Family History  Problem Relation Age of Onset  . Cancer Maternal Aunt 75       colon - great maternal aunt  . Cancer Paternal Aunt 61       gyn- unsure if ovarian or uterine- met to lung  . Breast cancer Maternal Aunt 43  . Breast cancer Mother 77  . Prostate cancer Paternal Uncle        dx >50  . Heart disease Maternal  Grandmother May 15, 1978  . Pneumonia Maternal Grandfather 28  . Lung cancer Paternal Grandmother 72  . Leukemia Paternal Grandfather 58  . Pancreatic cancer Paternal Aunt 54  . Bladder Cancer Paternal Aunt 46  . Lung cancer Paternal Aunt   . Other Cousin        BRCA2 +  . Breast cancer Cousin        dx <50  . Colon cancer Neg Hx   The patient's father died 05-16-15 apparently following multiple strokes. Patient's mother is passed away 09-13-2015, two weeks after being diagnosed with breast cancer, but she notes that her mother knew about a lump in her breast for 2 years and did not bring this to medical attention. She has no sisters, and had one brother, who is now deceased. She has a maternal aunt who was diagnosed with breast cancer before the age of 52 and a paternal aunt who was diagnosed with ovarian cancer before the age of 62.  GYNECOLOGIC HISTORY: Menarche age 81, menopause about five to seven years ago.  She has been on hormone replacement for various periods most recently started estradiol June 10, 2010.  She never had any complications from any of the hormones she took and she has a Mirena IUD in place.  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  The [employer] brings them the hens, they gather the eggs and then [employer] sets the eggs up to hatch.  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 They have two daughters "[name]" [name], 30, who works in [place], but lives at home and [name] "Claire" Barnes, 25, who has graduated from [employer]. [name] notes that her mother passed away in 12-Sep-2016, two weeks after being diagnosed with breast cancer.   ADVANCED DIRECTIVES: not in place  HEALTH MAINTENANCE: (updated 01/17/2013) Social History   Tobacco Use  . Smoking status: Never Smoker  . Smokeless tobacco: Never Used  Substance Use Topics  . Alcohol use: No  . Drug use: No      Colonoscopy: Never  PAP:  Bone density:  Osteopenia, May 2013  Lipid panel: Dr. Charlean Barnes    No Known Allergies  Current Outpatient Medications  Medication Sig Dispense Refill  . alendronate (FOSAMAX) 70 MG tablet Take 1 tablet (70 mg total) by mouth once a week. Take with a full glass of water on an empty stomach.    Marland Kitchen aspirin 81 MG tablet Take 81 mg by mouth daily.    . chlorpheniramine (CHLOR-TRIMETON) 4 MG tablet Take 1 tablet (4 mg total) by mouth 2 (two) times daily as needed for allergies. 14 tablet 0  . citalopram (CELEXA) 20 MG tablet Take 1 tablet (20 mg total) by mouth daily.    Marland Kitchen  fish oil-omega-3 fatty acids 1000 MG capsule Take 1 g by mouth daily.    Marland Kitchen gabapentin (NEURONTIN) 300 MG capsule Take 1 capsule (300 mg total) by mouth 3 (three) times daily. 90 capsule 3  . levothyroxine (SYNTHROID, LEVOTHROID) 88 MCG tablet Take 88 mcg by mouth daily before breakfast.    . meloxicam (MOBIC) 7.5 MG tablet Take 1 tablet (7.5 mg total) by mouth daily.    . mometasone (ELOCON) 0.1 % cream Apply 1 application topically daily. 45 g 0  . Multiple Vitamin (MULTIVITAMIN) capsule Take 1 capsule by mouth daily.     No current facility-administered medications for this visit.     OBJECTIVE: Middle-aged white woman no acute distress  Vitals:   06/19/17 1022  BP: 126/82  Pulse: 81  Temp: (!) 97.4 F (36.3 C)  SpO2: 97%     Body mass index is 34.97 kg/m.    ECOG FS: 1  Filed Weights   06/19/17 1022  Weight: 197 lb 6.4 oz (89.5 kg)   Sclerae unicteric, EOMs intact Oropharynx clear and moist No cervical or supraclavicular adenopathy Lungs no rales or rhonchi Heart regular rate and rhythm Abd soft, nontender, positive bowel sounds MSK no focal spinal tenderness, no upper extremity lymphedema Neuro: nonfocal, well oriented, appropriate affect Breasts: Deferred   LAB RESULTS: Lab Results  Component Value Date   WBC 8.2 04/10/2017   NEUTROABS 4.9 04/10/2017   HGB 13.6  04/10/2017   HCT 39.8 04/10/2017   MCV 89.4 04/10/2017   PLT 184 04/10/2017      Chemistry      Component Value Date/Time   NA 138 04/10/2017 1305   NA 142 08/05/2014 0818   K 3.9 04/10/2017 1305   K 3.8 08/05/2014 0818   CL 105 04/10/2017 1305   CL 105 07/09/2012 0808   CO2 25 04/10/2017 1305   CO2 23 08/05/2014 0818   BUN 10 04/10/2017 1305   BUN 10.7 08/05/2014 0818   CREATININE 0.76 04/10/2017 1305   CREATININE 0.8 08/05/2014 0818      Component Value Date/Time   CALCIUM 9.4 04/10/2017 1305   CALCIUM 8.5 08/05/2014 0818   ALKPHOS 66 04/10/2017 1305   ALKPHOS 62 08/05/2014 0818   AST 21 04/10/2017 1305   AST 22 08/05/2014 0818   ALT 20 04/10/2017 1305   ALT 20 08/05/2014 0818   BILITOT 0.6 04/10/2017 1305   BILITOT 0.59 08/05/2014 0818       STUDIES: Mammography scheduled for April 2019 and MRI scheduled for October 2019  ASSESSMENT: 57 y.o. BRCA2 positive [place] woman   (1)  status post right lumpectomy and axillary lymph node dissection April 2012 for a T1c N1 M0, stage IIA invasive ductal carcinoma, grade 3, strongly estrogen and progesterone receptor positive, HER-2 negative with an MIB-1 of 26%,   (2)  treated adjuvantly with 4 cycles of dose dense doxorubicin and cyclophosphamide followed by 9 weekly doses of paclitaxel   (3)  radiation completed December of 2012.   (4)  She started letrozole 03/15/2011, switched to tamoxifen as of January 2016  (a) completed 5 years of antiestrogens February 2018  (5) Osteopenia, started alendronate (Fosamax) May 2013  (6) genetics testing 05/11/2017 through the Common Hereditary Cancers Panel + Myelodysplastic syndrome/Leukemia Panel was ordered from [facility] found a Positive: Pathogeic variant in BRCA2 c.6275_6276del (p.Leu2092Profs*7).   (a) there were no pathogenic mutations noted in APC, ATM, AXIN2, BARD1, BLM, BMPR1A, BRCA1, BRIP1, CDH1, CDK4, CDKN2A (p14ARF), CDKN2A (p16INK4a), CEBPA,  CHEK2, CTNNA1, DICER1,  EPCAM*, GATA2, GREM1*, HRAS, KIT, MEN1, MLH1, MSH2, MSH3, MSH6, MUTYH, NBN, NF1, PALB2, PDGFRA, PMS2, POLD1, POLE, PTEN, RAD50, RAD51C, RAD51D, RUNX1, SDHB, SDHC, SDHD, SMAD4, SMARCA4, STK11, TERC, TERT, TP53, TSC1, TSC2, VHL. The following genes were evaluated for sequence changes only:HOXB13*, NTHL1*, SDHA.  (b) A variant of uncertain significance in the gene MSH3 was also identified c.2695A>G (p.Met899Val).   (7) intensified screening:   (a) mammography every April  (b) breast MRI every October  (c) by annual breast exam  (8) breast cancer risk reduction: Anastrozole started 06/19/2017  (9) ovarian cancer risk reduction: BSO planned   PLAN:  Javona has been found to carry a deleterious BRCA2 mutation.  We discussed this at length today.  As far as the breast cancer is concerned we discussed the difference between bilateral mastectomies and intensified screening.  She is very clear that she would like to avoid mastectomy if at all possible.  Accordingly she will have mammography this month and breast MRI in October.  We will continue this pattern indefinitely.  She will also have biannual breast and the exam.  For breast cancer risk reduction she will go back on an aromatase inhibitor, namely anastrozole.  She is very aware of the possible toxicities, side effects and complications of this agent.  For ovarian cancer unfortunately we do not have screening tests that have been proven to work.  We explained why this is so and why ovarian cancer flakes often spreads microscopically around the abdomen long before it can be detected sonographically  Accordingly I strongly urged her to proceed to bilateral salpingo-oophorectomy.  There is no need to consider a hysterectomy, which she very much wants to avoid.  I am placing a referral with our gynecological oncologist's today.  Kassidi's 2 daughters are going to be tested through our office.  If positive I will be glad to meet with them to discuss  alternatives  Telia herself will return to see me in October.  She knows to call for any other issues that may develop before then.   Kentavius Dettore, Virgie Dad, MD  06/19/17 10:40 AM Medical Oncology and Hematology [facility] [address] [place], [place] [address] Tel. [phone]    Fax. [phone]  This document serves as a record of services personally performed by Lurline Del, MD. It was created on his behalf by Steva Colder, a trained medical scribe. The creation of this record is based on the scribe's personal observations and the provider's statements to them.   I have reviewed the above documentation for accuracy and completeness, and I agree with the above.

## 2017-06-19 ENCOUNTER — Other Ambulatory Visit: Payer: Self-pay | Admitting: Oncology

## 2017-06-19 ENCOUNTER — Telehealth: Payer: Self-pay | Admitting: Oncology

## 2017-06-19 ENCOUNTER — Inpatient Hospital Stay: Payer: BLUE CROSS/BLUE SHIELD | Attending: Oncology | Admitting: Oncology

## 2017-06-19 VITALS — BP 126/82 | HR 81 | Temp 97.4°F | Ht 63.0 in | Wt 197.4 lb

## 2017-06-19 DIAGNOSIS — Z1231 Encounter for screening mammogram for malignant neoplasm of breast: Secondary | ICD-10-CM

## 2017-06-19 DIAGNOSIS — C50911 Malignant neoplasm of unspecified site of right female breast: Secondary | ICD-10-CM

## 2017-06-19 DIAGNOSIS — M8580 Other specified disorders of bone density and structure, unspecified site: Secondary | ICD-10-CM | POA: Insufficient documentation

## 2017-06-19 DIAGNOSIS — C50411 Malignant neoplasm of upper-outer quadrant of right female breast: Secondary | ICD-10-CM | POA: Diagnosis not present

## 2017-06-19 DIAGNOSIS — Z170 Estrogen receptor positive status [ER+]: Secondary | ICD-10-CM | POA: Diagnosis not present

## 2017-06-19 DIAGNOSIS — Z1501 Genetic susceptibility to malignant neoplasm of breast: Secondary | ICD-10-CM | POA: Insufficient documentation

## 2017-06-19 MED ORDER — ANASTROZOLE 1 MG PO TABS: 1.0000 mg | ORAL_TABLET | Freq: Every day | ORAL | 4 refills | Status: DC

## 2017-06-19 NOTE — Telephone Encounter (Signed)
Gave patient AVs and calendar of upcoming October appointments.  °

## 2017-06-21 ENCOUNTER — Telehealth: Payer: Self-pay | Admitting: *Deleted

## 2017-06-21 NOTE — Telephone Encounter (Signed)
Called and scheduled the patient to see Dr. Gerarda Fraction on May 6th

## 2017-07-17 ENCOUNTER — Inpatient Hospital Stay: Payer: BLUE CROSS/BLUE SHIELD

## 2017-07-17 ENCOUNTER — Other Ambulatory Visit: Payer: Self-pay | Admitting: Obstetrics

## 2017-07-17 ENCOUNTER — Encounter: Payer: Self-pay | Admitting: Obstetrics

## 2017-07-17 ENCOUNTER — Inpatient Hospital Stay: Payer: BLUE CROSS/BLUE SHIELD | Attending: Oncology | Admitting: Obstetrics

## 2017-07-17 ENCOUNTER — Other Ambulatory Visit (HOSPITAL_COMMUNITY)
Admission: RE | Admit: 2017-07-17 | Discharge: 2017-07-17 | Disposition: A | Discharge status: Home / Self Care | Payer: BLUE CROSS/BLUE SHIELD | Source: Ambulatory Visit | Attending: Obstetrics | Admitting: Obstetrics

## 2017-07-17 VITALS — BP 140/88 | HR 79 | Temp 98.2°F | Resp 20 | Ht 63.0 in | Wt 198.8 lb

## 2017-07-17 DIAGNOSIS — Z124 Encounter for screening for malignant neoplasm of cervix: Secondary | ICD-10-CM

## 2017-07-17 DIAGNOSIS — Z1509 Genetic susceptibility to other malignant neoplasm: Principal | ICD-10-CM

## 2017-07-17 DIAGNOSIS — Z9221 Personal history of antineoplastic chemotherapy: Secondary | ICD-10-CM | POA: Diagnosis not present

## 2017-07-17 DIAGNOSIS — Z1501 Genetic susceptibility to malignant neoplasm of breast: Secondary | ICD-10-CM

## 2017-07-17 DIAGNOSIS — Z1151 Encounter for screening for human papillomavirus (HPV): Secondary | ICD-10-CM | POA: Diagnosis not present

## 2017-07-17 DIAGNOSIS — Z800 Family history of malignant neoplasm of digestive organs: Secondary | ICD-10-CM

## 2017-07-17 DIAGNOSIS — Z853 Personal history of malignant neoplasm of breast: Secondary | ICD-10-CM | POA: Diagnosis not present

## 2017-07-17 DIAGNOSIS — Z803 Family history of malignant neoplasm of breast: Secondary | ICD-10-CM | POA: Diagnosis not present

## 2017-07-17 DIAGNOSIS — Z923 Personal history of irradiation: Secondary | ICD-10-CM | POA: Insufficient documentation

## 2017-07-17 NOTE — Progress Notes (Addendum)
Consult Note: Gyn-Onc  Consult was requested by Dr. Jana Hakim  CC:  Chief Complaint  Patient presents with  . BRCA2 positive    HPI: [name]  is a very nice 57 y.o.  P2  She has a personal history of breast cancer diagnosed in 2012.  In 2018 she lost her mother to breast cancer and coincidentally a cousin was diagnosed with breast cancer on her mother's side.  Therefore genetic testing 05/2017 was undertaken and the patient was found to have a BRCA2 pathogenic variant.  [facility] testing  is reviewed and she had a [id number](p.Leu2092Profs*7) Pathogenic Variant.  In addition a VUS was found in [facility] 3.  She has not had any screening GYN exams for some time including Pap.  She has not yet had transvaginal ultrasound or Ca125 for screening purposes.  She denies bloating, pelvic pain, changes in appetite, or unexpected weight loss.  Denies postmenopausal bleeding.  She does suffer from hot flashes but was recently restarted on anastrozole about 3 weeks prior to presentation.  She does have a history of tamoxifen use for 2-1/2 years with her initial breast cancer treatment.  She does not plan to have a prophylactic bilateral mastectomy and instead has chosen increased surveillance for her breast cancer risk.  Measurement of disease:  No results for input(s): CA125, CAN125, CEA, CA199, ESTRADIOL, INHBB in the last 8760 hours.  Invalid input(s): INHIBINA  . Pending further work-up   Radiology: . Pending further work-up    Oncologic History: See chart for her breast cancer history essentially she was treated with lumpectomy followed by radiation and chemotherapy with treatment completing in 2012.  States she was on she thinks from [place] for 2-1/2 years followed by tamoxifen for 2-1/2 years with the last dose of tamoxifen being February 2018.  She was restarted on anastrozole approximately April 2019.     Malignant neoplasm of upper-outer quadrant of right breast in female,  estrogen receptor positive ([place])   04/10/2017 Initial Diagnosis    Malignant neoplasm of upper-outer quadrant of right breast in female, estrogen receptor positive ([place])      06/03/2017 Genetic Testing    The Common Hereditary Cancers Panel + Myelodysplastic syndrome/Leukemia Panel was ordered from [facility] laboratories. The following genes were evaluated for sequence changes and exonic deletions/duplications: APC, ATM, AXIN2, BARD1, BLM, BMPR1A, BRCA1, BRCA2, BRIP1, CDH1, CDK4, CDKN2A (p14ARF), CDKN2A (p16INK4a), CEBPA, CHEK2, CTNNA1, DICER1, EPCAM*, GATA2, GREM1*, HRAS, KIT, MEN1, MLH1, MSH2, MSH3, MSH6, MUTYH, NBN, NF1, PALB2, PDGFRA, PMS2, POLD1, POLE, PTEN, RAD50, RAD51C, RAD51D, RUNX1, SDHB, SDHC, SDHD, SMAD4, SMARCA4, STK11, TERC, TERT, TP53, TSC1, TSC2, VHL. The following genes were evaluated for sequence changes only:HOXB13*, NTHL1*, SDHA.  Results: Positive: Pathnogeic variant in BRCA2 c.[id number] (p.Leu2092Profs*7). A variant of uncertain significance in the gene MSH3 was also identified c.2695A>G (p.Met899Val).        Current Meds:  Outpatient Encounter Medications as of 07/17/2017  Medication Sig  . alendronate (FOSAMAX) 70 MG tablet Take 1 tablet (70 mg total) by mouth once a week. Take with a full glass of water on an empty stomach.  Marland Kitchen anastrozole (ARIMIDEX) 1 MG tablet Take 1 tablet (1 mg total) by mouth daily.  Marland Kitchen aspirin 81 MG tablet Take 81 mg by mouth daily.  . chlorpheniramine (CHLOR-TRIMETON) 4 MG tablet Take 1 tablet (4 mg total) by mouth 2 (two) times daily as needed for allergies.  . citalopram (CELEXA) 20 MG tablet Take 1 tablet (20 mg total) by mouth daily.  Marland Kitchen  fish oil-omega-3 fatty acids 1000 MG capsule Take 1 g by mouth daily.  Marland Kitchen gabapentin (NEURONTIN) 300 MG capsule Take 1 capsule (300 mg total) by mouth 3 (three) times daily.  Marland Kitchen levothyroxine (SYNTHROID, LEVOTHROID) 88 MCG tablet Take 88 mcg by mouth daily before breakfast.  . meloxicam (MOBIC) 7.5 MG tablet Take 1  tablet (7.5 mg total) by mouth daily.  . Multiple Vitamin (MULTIVITAMIN) capsule Take 1 capsule by mouth daily.  . [DISCONTINUED] mometasone (ELOCON) 0.1 % cream Apply 1 application topically daily.   No facility-administered encounter medications on file as of 07/17/2017.     We did discuss holding fish oil and meloxicam prior to surgery.  She states she is not regularly taking aspirin but knows to hold that prior to surgery  Allergy: No Known Allergies  Social Hx:   Social History   Socioeconomic History  . Marital status: Married    Spouse name: Not on file  . Number of children: Not on file  . Years of education: Not on file  . Highest education level: Not on file  Occupational History  . Not on file  Social Needs  . Financial resource strain: Not on file  . Food insecurity:    Worry: Not on file    Inability: Not on file  . Transportation needs:    Medical: Not on file    Non-medical: Not on file  Tobacco Use  . Smoking status: Never Smoker  . Smokeless tobacco: Never Used  Substance and Sexual Activity  . Alcohol use: Yes    Comment: <10 / year, holidays only  . Drug use: No  . Sexual activity: Not on file  Lifestyle  . Physical activity:    Days per week: Not on file    Minutes per session: Not on file  . Stress: Not on file  Relationships  . Social connections:    Talks on phone: Not on file    Gets together: Not on file    Attends religious service: Not on file    Active member of club or organization: Not on file    Attends meetings of clubs or organizations: Not on file    Relationship status: Not on file  . Intimate partner violence:    Fear of current or ex partner: Not on file    Emotionally abused: Not on file    Physically abused: Not on file    Forced sexual activity: Not on file  Other Topics Concern  . Not on file  Social History Narrative  . Not on file    Past Surgical Hx:  Past Surgical History:  Procedure Laterality Date  . BREAST  LUMPECTOMY WITH AXILLARY LYMPH NODE BIOPSY Right 07/08/2010  . orthognathic sx  1991   due to accident as a child. sx to straighten dental bite.  Marland Kitchen PORT-A-CATH REMOVAL  12/28/2010   power port per patient    Past Medical Hx:  Past Medical History:  Diagnosis Date  . Arthritis   . Breast cancer, right ([place]) 2012  . Family history of BRCA2 gene positive   . Family history of cancer   . History of radiation therapy 02/2011   R breast  . Hypothyroid   . Osteopenia 08/09/2011    Past Gynecological History:   GYNECOLOGIC HISTORY:  No LMP recorded. Patient is postmenopausal. Menarche: 57 years old P 2 LMP 57 yo Contraceptive OCP and Mirena x ~10 years HRT vaginal estrogen, no oral HRT  Last Pap "years"  Family Hx:  Family History  Problem Relation Age of Onset  . Cancer Maternal Aunt 75       colon - great maternal aunt  . Cancer Paternal Aunt 22       gyn- unsure if ovarian or uterine- met to lung  . Breast cancer Maternal Aunt 35  . Breast cancer Mother 38  . Prostate cancer Paternal Uncle        dx >50  . Heart disease Maternal Grandmother 80  . Pneumonia Maternal Grandfather 46  . Lung cancer Paternal Grandmother 22  . Leukemia Paternal Grandfather 60  . Pancreatic cancer Paternal Aunt 36  . Bladder Cancer Paternal Aunt 40  . Lung cancer Paternal Aunt   . Other Cousin        BRCA2 +  . Breast cancer Cousin        dx <50  . Colon cancer Neg Hx     Review of Systems:  Review of Systems  Constitutional: Negative.   HENT:  Negative.   Eyes: Negative.   Respiratory: Negative.   Cardiovascular: Negative.   Gastrointestinal: Negative.   Endocrine: Positive for hot flashes.  Genitourinary: Negative.    Skin: Negative.   Neurological: Negative.   Hematological: Negative.   Psychiatric/Behavioral: The patient is nervous/anxious.   notes joint stiffness  Vitals:  Blood pressure 140/88, pulse 79, temperature 98.2 F (36.8 C), temperature source Oral, resp.  rate 20, height '5\' 3"'$  (1.6 m), weight 198 lb 12.8 oz (90.2 kg), SpO2 99 %. Body mass index is 35.22 kg/m.   Physical Exam: ECOG PERFORMANCE STATUS: 0 - Asymptomatic   General :  Well developed, 57 y.o., female in no apparent distress HEENT:  Normocephalic/atraumatic, symmetric, EOMI, eyelids normal Neck:   Supple, no masses.  Lymphatics:  No cervical/ submandibular/ supraclavicular/ infraclavicular/ inguinal adenopathy Respiratory:  Respirations unlabored, no use of accessory muscles CV:   Deferred Breast:  Deferred Musculoskeletal: No CVA tenderness, normal muscle strength. Abdomen:  Soft, non-tender and nondistended. No evidence of hernia. No masses. Extremities:  No lymphedema, no erythema, non-tender. Skin:   Normal inspection Neuro/Psych:  No focal motor deficit, no abnormal mental status. Normal gait. Normal affect. Alert and oriented to person, place, and time  Genito Urinary: Vulva: Normal external female genitalia.  Bladder/urethra: Urethral meatus normal in size and location. No lesions or   masses, well supported bladder Speculum exam: Some mild pelvic relaxation Vagina: No lesion, no discharge, no bleeding. Cervix: Normal appearing, no lesions. Bimanual exam:  Uterus: Normal size, mobile.  Adnexa: No masses.  What feels to be a palpably normal left ovary ; right adnexa not definable but no masses Rectovaginal:  Good tone, no masses, no cul de sac nodularity, no parametrial involvement or nodularity.  Oncologic Summary: 1. BRCA2 mutation 2. Personal family history of breast cancer; with possible ovarian cancer in one family member   Assessment/Plan: 58. BRCA2 mutation and risks of mullerian cancer o We discussed increased risk of ovarian cancer 15 to 25% and recommendations for consideration of prophylactic risk reducing salpingo-oophorectomy versus increased screening o We also discussed the increased risk of primary peritoneal cancer over the population but still a  very low risk that no additional screening would be recommended. 2. RRSO o She would like to proceed with prophylactic risk reducing salpingo-oophorectomy. o She understands that this will not address her slightly increased risk of a population of primary peritoneal cancer. 3. Social considerations o She is a former and needs to be  physically active following her procedure.  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 4. Surgical discussion o We reviewed the surgical sketch today along with the risk benefits and alternatives.  She was given a copy of this and one will be placed in her chart. 5. Pap smear was performed today and will be followed up 6. Return to clinic prior to surgery to finalize plans   Isabel Caprice, MD  07/17/2017, 2:55 PM  Cc: Lurline Del MD (Referring oncologist) Dr. Marda Stalker (PCP)

## 2017-07-17 NOTE — Patient Instructions (Addendum)
1. We will order a Transvaginal ultrasound and CA125 while awaiting your surgery. We will let you know about these results 2. Surgery will be planned sometime in August/September with a visit within a month prior.                Preparing for your Surgery  Potential surgery dates in August/September include August 13, August 15, August 20, August 27, Sept 4 with Dr. Precious Haws at [place] will be scheduled for a robotic assisted bilateral salpingo-oophorectomy, possible laparotomy with staging, hysterectomy if cancer identified.  Pre-operative Testing -You will receive a phone call from presurgical testing at [facility] to arrange for a pre-operative testing appointment before your surgery.  This appointment normally occurs one to two weeks before your scheduled surgery.   -Bring your insurance card, copy of an advanced directive if applicable, medication list  -At that visit, you will be asked to sign a consent for a possible blood transfusion in case a transfusion becomes necessary during surgery.  The need for a blood transfusion is rare but having consent is a necessary part of your care.     -You should not be taking blood thinners or aspirin at least ten days prior to surgery unless instructed by your surgeon.  Day Before Surgery at [place] will be asked to take in a light diet the day before surgery.  Avoid carbonated beverages.  You will be advised to have nothing to eat or drink after midnight the evening before.    Eat a light diet the day before surgery.  Examples including soups, broths, toast, yogurt, mashed potatoes.  Things to avoid include carbonated beverages (fizzy beverages), raw fruits and raw vegetables, or beans.   If your bowels are filled with gas, your surgeon will have difficulty visualizing your pelvic organs which increases your surgical risks.  Your role in recovery Your role is to become active as soon as directed by your doctor,  while still giving yourself time to heal.  Rest when you feel tired. You will be asked to do the following in order to speed your recovery:  - Cough and breathe deeply. This helps toclear and expand your lungs and can prevent pneumonia. You may be given a spirometer to practice deep breathing. A staff member will show you how to use the spirometer. - Do mild physical activity. Walking or moving your legs help your circulation and body functions return to normal. A staff member will help you when you try to walk and will provide you with simple exercises. Do not try to get up or walk alone the first time. - Actively manage your pain. Managing your pain lets you move in comfort. We will ask you to rate your pain on a scale of zero to 10. It is your responsibility to tell your doctor or nurse where and how much you hurt so your pain can be treated.  Special Considerations -If you are diabetic, you may be placed on insulin after surgery to have closer control over your blood sugars to promote healing and recovery.  This does not mean that you will be discharged on insulin.  If applicable, your oral antidiabetics will be resumed when you are tolerating a solid diet.  -Your final pathology results from surgery should be available by the Friday after surgery and the results will be relayed to you when available.  -Dr. Lahoma Crocker is the Surgeon that assists your GYN Oncologist with surgery.  The next day after your surgery you will either see your GYN Oncologist or Dr. Lahoma Crocker.   Blood Transfusion Information WHAT IS A BLOOD TRANSFUSION? A transfusion is the replacement of blood or some of its parts. Blood is made up of multiple cells which provide different functions.  Red blood cells carry oxygen and are used for blood loss replacement.  White blood cells fight against infection.  Platelets control bleeding.  Plasma helps clot blood.  Other blood products are available for  specialized needs, such as hemophilia or other clotting disorders. BEFORE THE TRANSFUSION  Who gives blood for transfusions?   You may be able to donate blood to be used at a later date on yourself (autologous donation).  Relatives can be asked to donate blood. This is generally not any safer than if you have received blood from a stranger. The same precautions are taken to ensure safety when a relative's blood is donated.  Healthy volunteers who are fully evaluated to make sure their blood is safe. This is blood bank blood. Transfusion therapy is the safest it has ever been in the practice of medicine. Before blood is taken from a donor, a complete history is taken to make sure that person has no history of diseases nor engages in risky social behavior (examples are intravenous drug use or sexual activity with multiple partners). The donor's travel history is screened to minimize risk of transmitting infections, such as malaria. The donated blood is tested for signs of infectious diseases, such as HIV and hepatitis. The blood is then tested to be sure it is compatible with you in order to minimize the chance of a transfusion reaction. If you or a relative donates blood, this is often done in anticipation of surgery and is not appropriate for emergency situations. It takes many days to process the donated blood. RISKS AND COMPLICATIONS Although transfusion therapy is very safe and saves many lives, the main dangers of transfusion include:   Getting an infectious disease.  Developing a transfusion reaction. This is an allergic reaction to something in the blood you were given. Every precaution is taken to prevent this. The decision to have a blood transfusion has been considered carefully by your caregiver before blood is given. Blood is not given unless the benefits outweigh the risks.

## 2017-07-18 LAB — CA 125: Cancer Antigen (CA) 125: 31.6 U/mL (ref 0.0–38.1)

## 2017-07-19 LAB — CYTOLOGY - PAP
Diagnosis: NEGATIVE
HPV (WINDOPATH): NOT DETECTED

## 2017-07-20 ENCOUNTER — Ambulatory Visit
Admission: RE | Admit: 2017-07-20 | Discharge: 2017-07-20 | Disposition: A | Discharge status: Home / Self Care | Payer: BLUE CROSS/BLUE SHIELD | Source: Ambulatory Visit | Attending: Oncology | Admitting: Oncology

## 2017-07-20 ENCOUNTER — Other Ambulatory Visit: Payer: Self-pay | Admitting: Obstetrics

## 2017-07-20 ENCOUNTER — Ambulatory Visit (HOSPITAL_COMMUNITY)
Admission: RE | Admit: 2017-07-20 | Discharge: 2017-07-20 | Disposition: A | Discharge status: Home / Self Care | Payer: BLUE CROSS/BLUE SHIELD | Source: Ambulatory Visit | Attending: Obstetrics | Admitting: Obstetrics

## 2017-07-20 ENCOUNTER — Telehealth: Payer: Self-pay

## 2017-07-20 ENCOUNTER — Other Ambulatory Visit: Payer: Self-pay | Admitting: Gynecologic Oncology

## 2017-07-20 DIAGNOSIS — C50911 Malignant neoplasm of unspecified site of right female breast: Secondary | ICD-10-CM

## 2017-07-20 DIAGNOSIS — Z1501 Genetic susceptibility to malignant neoplasm of breast: Secondary | ICD-10-CM | POA: Diagnosis not present

## 2017-07-20 DIAGNOSIS — C50411 Malignant neoplasm of upper-outer quadrant of right female breast: Secondary | ICD-10-CM

## 2017-07-20 DIAGNOSIS — Z170 Estrogen receptor positive status [ER+]: Principal | ICD-10-CM

## 2017-07-20 DIAGNOSIS — Z1509 Genetic susceptibility to other malignant neoplasm: Secondary | ICD-10-CM

## 2017-07-20 DIAGNOSIS — Z1231 Encounter for screening mammogram for malignant neoplasm of breast: Secondary | ICD-10-CM

## 2017-07-20 HISTORY — DX: Personal history of antineoplastic chemotherapy: Z92.21

## 2017-07-20 HISTORY — DX: Personal history of irradiation: Z92.3

## 2017-07-20 NOTE — Telephone Encounter (Signed)
LM in patient's cell phone vm that her PAP smear was normal and no HPV detected. Pt can call the office if she has any questions or concerns.

## 2017-08-01 ENCOUNTER — Telehealth: Payer: Self-pay

## 2017-08-01 NOTE — Telephone Encounter (Signed)
Per Dr Gerarda Fraction, her CA 125 and GYN u/s are both normal.  Tried to reach pt via home and mobile numbers, no answer, left VM with our contact info.

## 2017-08-02 ENCOUNTER — Telehealth: Payer: Self-pay

## 2017-08-02 NOTE — Telephone Encounter (Signed)
Told [name] that her CA-125 from 07-17-17 and [place] from  07-20-17 as well as her PAP smear from 07-17-17 were all normal. Pt appreciated the call.  She did receive the messages to call but thought it was too late to call.

## 2017-08-17 ENCOUNTER — Other Ambulatory Visit: Payer: Self-pay | Admitting: Oncology

## 2017-08-17 NOTE — Progress Notes (Unsigned)
ID: [name]   DOB: Dec 09, 1960  MR#: [id number]  ZJQ#:[id number]   PCP:  Claire Croon, MD GYN: [name] M.D. SU:  Claire Klein, MD OTHER:   Claire Rudd, MD  CHIEF COMPLAINT:  Right Breast Cancer with BRCA2 mutation CURRENT TREATMENT: tamoxifen    HISTORY OF PRESENT ILLNESS: From the original intake note:  [name] had screening mammography June 09, 2010 at the breast center.  This suggested a possible abnormality in the right breast.  She was brought back for additional views on April 4.  [name] was able to palpate a firm mobile mass at the 9 o'clock position 7 cm from the nipple.  Compression views confirmed an ill-defined mass with punctate and linear calcifications.  Ultrasound found this to be hypoechoic and to measure 1 cm.  In addition there was a lymph node with a diffusely thickened cortex measuring 1.5 cm in the right axilla.  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  With this information the patient was set up for breast MRI on April 13.  This showed in the posterior third of the upper outer quadrant of the right breast an irregular mass measuring 1.8 cm.  There was no multifocal or multicentric disease and there were at least two right axillary lymph nodes with thickened cortices.  There was no left axillary or left breast mass, no internal mammary chain adenopathy.  She proceeded to definitive therapy as detailed below.  INTERVAL HISTORY:  [name] returns today for follow-up and treatment of her breast cancer. She is accompanied by her husband. She has been off antiestrogen after completing the initial 5 years.. She has done well  since discontinuing this medication.   Since her last visit to the office, she underwent genetic testing on 05/11/2017 with results of: Positive result. 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 Variant of Uncertain Significance identified in MSH3.    REVIEW OF SYSTEMS:  [name] reports that for exercise, she continues to work at this time. Generally she is doing well and she is working all the time. She has increased fatigue and aching. She will occasionally take hydroxyzine for her insomnia. She denies unusual headaches, visual changes, nausea, vomiting, or dizziness. There has been no unusual cough, phlegm production, or pleurisy. This been no change in bowel or bladder habits. She denies unexplained fatigue or unexplained weight loss, bleeding, rash, or fever. A detailed review of systems was otherwise stable.     PAST MEDICAL HISTORY: Past Medical History:  Diagnosis Date  . Arthritis   . Breast cancer, right ([name]) 2012  . Family history of BRCA2 gene positive   . Family history of cancer   . History of radiation therapy 02/2011   R breast  . Hypothyroid   . Osteopenia 08/09/2011  . Personal history of chemotherapy   . Personal history of radiation therapy     PAST SURGICAL HISTORY: Past Surgical History:  Procedure Laterality Date  . BREAST LUMPECTOMY Right 2012  . BREAST LUMPECTOMY WITH AXILLARY LYMPH NODE BIOPSY Right 07/08/2010  . orthognathic sx  1991   due to accident as a child. sx to straighten  dental bite.  Marland Kitchen PORT-A-CATH REMOVAL  12/28/2010   power port per patient    FAMILY HISTORY Family History  Problem Relation Age of Onset  . Cancer Maternal Aunt 75       colon - great maternal aunt  . Cancer Paternal Aunt 23       gyn- unsure if ovarian or uterine- met to lung  . Breast cancer Maternal Aunt 88  . Breast cancer Mother 46  . Prostate cancer Paternal Uncle        dx >50  . Heart disease Maternal Grandmother 05/01/78  . Pneumonia Maternal  Grandfather 10  . Lung cancer Paternal Grandmother 72  . Leukemia Paternal Grandfather 39  . Pancreatic cancer Paternal Aunt 10  . Bladder Cancer Paternal Aunt 70  . Lung cancer Paternal Aunt   . Other Cousin        BRCA2 +  . Breast cancer Cousin        dx <50  . Colon cancer Neg Hx   The patient's father died 2015/05/02 apparently following multiple strokes. Patient's mother is passed away Aug 30, 2015, two weeks after being diagnosed with breast cancer, but she notes that her mother knew about a lump in her breast for 2 years and did not bring this to medical attention. She has no sisters, and had one brother, who is now deceased. She has a maternal aunt who was diagnosed with breast cancer before the age of 54 and a paternal aunt who was diagnosed with ovarian cancer before the age of 34.  GYNECOLOGIC HISTORY: Menarche age 17, menopause about five to seven years ago.  She has been on hormone replacement for various periods most recently started estradiol June 10, 2010.  She never had any complications from any of the hormones she took and she has a Mirena IUD in place.  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  The [employer] brings them the hens, they gather the eggs and then [employer] sets the eggs up to hatch.  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 They have two daughters "[name]" [name], 30, who works in [place], but lives at home and [name] "[name]" Froid, 25, who has graduated from [employer]. [name] notes that her mother passed away in 2016/08/29, two weeks after being diagnosed with breast cancer.   ADVANCED DIRECTIVES: not in place  HEALTH MAINTENANCE: (updated 01/17/2013) Social History   Tobacco Use  . Smoking status: Never Smoker  . Smokeless tobacco: Never Used  Substance Use Topics  . Alcohol use: Yes    Comment: <10 / year, holidays only  . Drug use: No      Colonoscopy: Never  PAP:  Bone density:  Osteopenia, May 2013  Lipid panel: Dr. Charlean Barnes    No Known Allergies  Current Outpatient Medications  Medication Sig Dispense Refill  . alendronate (FOSAMAX) 70 MG tablet Take 1 tablet (70 mg total) by mouth once a week. Take with a full glass of water on an empty stomach.    Marland Kitchen anastrozole (ARIMIDEX) 1 MG tablet Take 1 tablet (1 mg total) by mouth daily. 90 tablet 4  . aspirin 81 MG tablet Take 81 mg by mouth daily.    . chlorpheniramine (CHLOR-TRIMETON) 4 MG tablet Take 1 tablet (4 mg total) by mouth 2 (two) times daily as needed for allergies. 14 tablet 0  .  citalopram (CELEXA) 20 MG tablet Take 1 tablet (20 mg total) by mouth daily.    . fish oil-omega-3 fatty acids 1000 MG capsule Take 1 g by mouth daily.    Marland Kitchen gabapentin (NEURONTIN) 300 MG capsule Take 1 capsule (300 mg total) by mouth 3 (three) times daily. 90 capsule 3  . levothyroxine (SYNTHROID, LEVOTHROID) 88 MCG tablet Take 88 mcg by mouth daily before breakfast.    . meloxicam (MOBIC) 7.5 MG tablet Take 1 tablet (7.5 mg total) by mouth daily.    . Multiple Vitamin (MULTIVITAMIN) capsule Take 1 capsule by mouth daily.     No current facility-administered medications for this visit.     OBJECTIVE: Middle-aged white woman no acute distress  There were no vitals filed for this visit.   There is no height or weight on file to calculate BMI.    ECOG FS: 1  There were no vitals filed for this visit. Sclerae unicteric, EOMs intact Oropharynx clear and moist No cervical or supraclavicular adenopathy Lungs no rales or rhonchi Heart regular rate and rhythm Abd soft, nontender, positive bowel sounds MSK no focal spinal tenderness, no upper extremity lymphedema Neuro: nonfocal, well oriented, appropriate affect Breasts: Deferred   LAB RESULTS: Lab Results  Component Value Date   WBC 8.2 04/10/2017   NEUTROABS 4.9 04/10/2017   HGB 13.6 04/10/2017   HCT 39.8 04/10/2017   MCV  89.4 04/10/2017   PLT 184 04/10/2017      Chemistry      Component Value Date/Time   NA 138 04/10/2017 1305   NA 142 08/05/2014 0818   K 3.9 04/10/2017 1305   K 3.8 08/05/2014 0818   CL 105 04/10/2017 1305   CL 105 07/09/2012 0808   CO2 25 04/10/2017 1305   CO2 23 08/05/2014 0818   BUN 10 04/10/2017 1305   BUN 10.7 08/05/2014 0818   CREATININE 0.76 04/10/2017 1305   CREATININE 0.8 08/05/2014 0818      Component Value Date/Time   CALCIUM 9.4 04/10/2017 1305   CALCIUM 8.5 08/05/2014 0818   ALKPHOS 66 04/10/2017 1305   ALKPHOS 62 08/05/2014 0818   AST 21 04/10/2017 1305   AST 22 08/05/2014 0818   ALT 20 04/10/2017 1305   ALT 20 08/05/2014 0818   BILITOT 0.6 04/10/2017 1305   BILITOT 0.59 08/05/2014 0818       STUDIES: Mammography scheduled for April 2019 and MRI scheduled for October 2019  ASSESSMENT: 57 y.o. BRCA2 positive [place] woman   (1)  status post right lumpectomy and axillary lymph node dissection April 2012 for a T1c N1 M0, stage IIA invasive ductal carcinoma, grade 3, strongly estrogen and progesterone receptor positive, HER-2 negative with an MIB-1 of 26%,   (2)  treated adjuvantly with 4 cycles of dose dense doxorubicin and cyclophosphamide followed by 9 weekly doses of paclitaxel   (3)  radiation completed December of 2012.   (4)  She started letrozole 03/15/2011, switched to tamoxifen as of January 2016  (a) completed 5 years of antiestrogens February 2018  (5) Osteopenia, started alendronate (Fosamax) May 2013  (6) genetics testing 05/11/2017 through the Common Hereditary Cancers Panel + Myelodysplastic syndrome/Leukemia Panel was ordered from [facility] found a Positive: Pathogeic variant in BRCA2 c.[id number] (p.Leu2092Profs*7).   (a) there were no pathogenic mutations noted in APC, ATM, AXIN2, BARD1, BLM, BMPR1A, BRCA1, BRIP1, CDH1, CDK4, CDKN2A (p14ARF), CDKN2A (p16INK4a), CEBPA, CHEK2, CTNNA1, DICER1, EPCAM*, GATA2, GREM1*, HRAS, KIT, MEN1, MLH1,  MSH2, MSH3, MSH6, MUTYH,  NBN, NF1, PALB2, PDGFRA, PMS2, POLD1, POLE, PTEN, RAD50, RAD51C, RAD51D, RUNX1, SDHB, SDHC, SDHD, SMAD4, SMARCA4, STK11, TERC, TERT, TP53, TSC1, TSC2, VHL. The following genes were evaluated for sequence changes only:HOXB13*, NTHL1*, SDHA.  (b) A variant of uncertain significance in the gene MSH3 was also identified c.2695A>G (p.Met899Val).   (7) intensified screening:   (a) mammography every April  (b) breast MRI every October  (c) by annual breast exam  (8) breast cancer risk reduction: Anastrozole started 06/19/2017  (9) ovarian cancer risk reduction: BSO planned   PLAN:  Deborha has been found to carry a deleterious BRCA2 mutation.  We discussed this at length today.  As far as the breast cancer is concerned we discussed the difference between bilateral mastectomies and intensified screening.  She is very clear that she would like to avoid mastectomy if at all possible.  Accordingly she will have mammography this month and breast MRI in October.  We will continue this pattern indefinitely.  She will also have biannual breast and the exam.  For breast cancer risk reduction she will go back on an aromatase inhibitor, namely anastrozole.  She is very aware of the possible toxicities, side effects and complications of this agent.  For ovarian cancer unfortunately we do not have screening tests that have been proven to work.  We explained why this is so and why ovarian cancer flakes often spreads microscopically around the abdomen long before it can be detected sonographically  Accordingly I strongly urged her to proceed to bilateral salpingo-oophorectomy.  There is no need to consider a hysterectomy, which she very much wants to avoid.  I am placing a referral with our gynecological oncologist's today.  Jocelynne's 2 daughters are going to be tested through our office.  If positive I will be glad to meet with them to discuss alternatives  Lashone herself will return to  see me in October.  She knows to call for any other issues that may develop before then.   Magrinat, Virgie Dad, MD  08/17/17 2:29 PM Medical Oncology and Hematology [facility] [address] [place], [place] [address] Tel. [phone]    Fax. [phone]  This document serves as a record of services personally performed by Lurline Del, MD. It was created on his behalf by Steva Colder, a trained medical scribe. The creation of this record is based on the scribe's personal observations and the provider's statements to them.   I have reviewed the above documentation for accuracy and completeness, and I agree with the above.

## 2017-08-23 ENCOUNTER — Telehealth: Payer: Self-pay | Admitting: Genetics

## 2017-08-23 NOTE — Telephone Encounter (Signed)
[name] cousins [name] and [name] are coming in for genetic counseling.  [name] gave verbal permission to discuss her genetic test results and all information obtained during her genetic counseling appointment with her cousins [name] and [name].

## 2017-10-16 ENCOUNTER — Inpatient Hospital Stay: Payer: BLUE CROSS/BLUE SHIELD | Attending: Oncology | Admitting: Obstetrics

## 2017-10-16 ENCOUNTER — Encounter: Payer: Self-pay | Admitting: Obstetrics

## 2017-10-16 ENCOUNTER — Telehealth: Payer: Self-pay | Admitting: Oncology

## 2017-10-16 VITALS — BP 135/76 | HR 74 | Temp 98.3°F | Resp 20 | Ht 63.0 in | Wt 202.6 lb

## 2017-10-16 DIAGNOSIS — Z1501 Genetic susceptibility to malignant neoplasm of breast: Secondary | ICD-10-CM | POA: Insufficient documentation

## 2017-10-16 DIAGNOSIS — Z1509 Genetic susceptibility to other malignant neoplasm: Secondary | ICD-10-CM | POA: Diagnosis not present

## 2017-10-16 NOTE — Patient Instructions (Signed)
Preparing for your Surgery  Plan for surgery on December 05, 2017 with Dr. Precious Haws at [facility] will be scheduled for a robotic assisted total hysterectomy, bilateral salpingo-oophorectomy, possible laparotomy possible staging.  Pre-operative Testing -You will receive a phone call from presurgical testing at [facility] to arrange for a pre-operative testing appointment before your surgery.  This appointment normally occurs one to two weeks before your scheduled surgery.   -Bring your insurance card, copy of an advanced directive if applicable, medication list  -At that visit, you will be asked to sign a consent for a possible blood transfusion in case a transfusion becomes necessary during surgery.  The need for a blood transfusion is rare but having consent is a necessary part of your care.     -You should not be taking blood thinners or aspirin at least ten days prior to surgery unless instructed by your surgeon.  Day Before Surgery at [facility] will be asked to take in a light diet the day before surgery.  Avoid carbonated beverages.  You will be advised to have nothing to eat or drink after midnight the evening before.    Eat a light diet the day before surgery.  Examples including soups, broths, toast, yogurt, mashed potatoes.  Things to avoid include carbonated beverages (fizzy beverages), raw fruits and raw vegetables, or beans.   If your bowels are filled with gas, your surgeon will have difficulty visualizing your pelvic organs which increases your surgical risks.  Your role in recovery Your role is to become active as soon as directed by your doctor, while still giving yourself time to heal.  Rest when you feel tired. You will be asked to do the following in order to speed your recovery:  - Cough and breathe deeply. This helps toclear and expand your lungs and can prevent pneumonia. You may be given a spirometer to practice deep  breathing. A staff member will show you how to use the spirometer. - Do mild physical activity. Walking or moving your legs help your circulation and body functions return to normal. A staff member will help you when you try to walk and will provide you with simple exercises. Do not try to get up or walk alone the first time. - Actively manage your pain. Managing your pain lets you move in comfort. We will ask you to rate your pain on a scale of zero to 10. It is your responsibility to tell your doctor or nurse where and how much you hurt so your pain can be treated.  Special Considerations -If you are diabetic, you may be placed on insulin after surgery to have closer control over your blood sugars to promote healing and recovery.  This does not mean that you will be discharged on insulin.  If applicable, your oral antidiabetics will be resumed when you are tolerating a solid diet.  -Your final pathology results from surgery should be available by the Friday after surgery and the results will be relayed to you when available.  -Dr. Everitt Amber or Dr. Lahoma Crocker will be the surgeon that assists your GYN Oncologist with surgery.  The next day after your surgery you will either see your GYN Oncologist, Dr. Denman George, or Dr. Lahoma Crocker.   Blood Transfusion Information WHAT IS A BLOOD TRANSFUSION? A transfusion is the replacement of blood or some of its parts. Blood is made up of multiple cells which provide different functions.  Red blood cells  carry oxygen and are used for blood loss replacement.  White blood cells fight against infection.  Platelets control bleeding.  Plasma helps clot blood.  Other blood products are available for specialized needs, such as hemophilia or other clotting disorders. BEFORE THE TRANSFUSION  Who gives blood for transfusions?   You may be able to donate blood to be used at a later date on yourself (autologous donation).  Relatives can be asked to  donate blood. This is generally not any safer than if you have received blood from a stranger. The same precautions are taken to ensure safety when a relative's blood is donated.  Healthy volunteers who are fully evaluated to make sure their blood is safe. This is blood bank blood. Transfusion therapy is the safest it has ever been in the practice of medicine. Before blood is taken from a donor, a complete history is taken to make sure that person has no history of diseases nor engages in risky social behavior (examples are intravenous drug use or sexual activity with multiple partners). The donor's travel history is screened to minimize risk of transmitting infections, such as malaria. The donated blood is tested for signs of infectious diseases, such as HIV and hepatitis. The blood is then tested to be sure it is compatible with you in order to minimize the chance of a transfusion reaction. If you or a relative donates blood, this is often done in anticipation of surgery and is not appropriate for emergency situations. It takes many days to process the donated blood. RISKS AND COMPLICATIONS Although transfusion therapy is very safe and saves many lives, the main dangers of transfusion include:   Getting an infectious disease.  Developing a transfusion reaction. This is an allergic reaction to something in the blood you were given. Every precaution is taken to prevent this. The decision to have a blood transfusion has been considered carefully by your caregiver before blood is given. Blood is not given unless the benefits outweigh the risks.

## 2017-10-16 NOTE — Telephone Encounter (Signed)
Patient stopped by to schedule an appt w/ Dr. Jana Hakim.  Scheduled per 8/5 sch msg. Gave patient calendar.

## 2017-10-16 NOTE — Progress Notes (Signed)
[place] at [facility]    Progress Note : Established Patient FOLLOW-UP    Consult was originally requested by Dr. Jana Hakim  CC:  Chief Complaint  Patient presents with  . BRCA2 positive    HPI: [name]  is a very nice 57 y.o.  P2  She has a personal history of breast cancer diagnosed in 2012.  In 2018 she lost her mother to breast cancer and coincidentally a cousin was diagnosed with breast cancer on her mother's side.  Therefore genetic testing 05/2017 was undertaken and the patient was found to have a BRCA2 pathogenic variant.  [facility] testing  is reviewed and she had a c.6275_627del(p.Leu2092Profs*7) Pathogenic Variant.  In addition a VUS was found in [facility] 3.  She has not had any screening GYN exams for some time including Pap.  She has not yet had transvaginal ultrasound or Ca125 for screening purposes.  She denies bloating, pelvic pain, changes in appetite, or unexpected weight loss.  Denies postmenopausal bleeding.  She does suffer from hot flashes but was recently restarted on anastrozole about 3 weeks prior to presentation.  She does have a history of tamoxifen use for 2-1/2 years with her initial breast cancer treatment.  She does not plan to have a prophylactic bilateral mastectomy and instead has chosen increased surveillance for her breast cancer risk.  Measurement of disease:  Recent Labs    07/17/17 1346  CAN125 31.6    . Pending further work-up   Radiology: . Pending further work-up    Oncologic History: See chart for her breast cancer history essentially she was treated with lumpectomy followed by radiation and chemotherapy with treatment completing in 2012.  States she was on she thinks from [place] for 2-1/2 years followed by tamoxifen for 2-1/2 years with the last dose of tamoxifen being February 2018.  She was restarted on anastrozole approximately April 2019.     Malignant neoplasm of upper-outer  quadrant of right breast in female, estrogen receptor positive ([place])   04/10/2017 Initial Diagnosis    Malignant neoplasm of upper-outer quadrant of right breast in female, estrogen receptor positive ([place])      06/03/2017 Genetic Testing    The Common Hereditary Cancers Panel + Myelodysplastic syndrome/Leukemia Panel was ordered from [facility] laboratories. The following genes were evaluated for sequence changes and exonic deletions/duplications: APC, ATM, AXIN2, BARD1, BLM, BMPR1A, BRCA1, BRCA2, BRIP1, CDH1, CDK4, CDKN2A (p14ARF), CDKN2A (p16INK4a), CEBPA, CHEK2, CTNNA1, DICER1, EPCAM*, GATA2, GREM1*, HRAS, KIT, MEN1, MLH1, MSH2, MSH3, MSH6, MUTYH, NBN, NF1, PALB2, PDGFRA, PMS2, POLD1, POLE, PTEN, RAD50, RAD51C, RAD51D, RUNX1, SDHB, SDHC, SDHD, SMAD4, SMARCA4, STK11, TERC, TERT, TP53, TSC1, TSC2, VHL. The following genes were evaluated for sequence changes only:HOXB13*, NTHL1*, SDHA.  Results: Positive: Pathnogeic variant in BRCA2 c.6275_6276del (p.Leu2092Profs*7). A variant of uncertain significance in the gene MSH3 was also identified c.2695A>G (p.Met899Val).        Current Meds:  Outpatient Encounter Medications as of 10/16/2017  Medication Sig  . alendronate (FOSAMAX) 70 MG tablet Take 1 tablet (70 mg total) by mouth once a week. Take with a full glass of water on an empty stomach.  Marland Kitchen anastrozole (ARIMIDEX) 1 MG tablet Take 1 tablet (1 mg total) by mouth daily.  Marland Kitchen aspirin 81 MG tablet Take 81 mg by mouth daily.  . chlorpheniramine (CHLOR-TRIMETON) 4 MG tablet Take 1 tablet (4 mg total) by mouth 2 (two) times daily as needed for allergies.  . citalopram (CELEXA) 20  MG tablet Take 1 tablet (20 mg total) by mouth daily.  . fish oil-omega-3 fatty acids 1000 MG capsule Take 1 g by mouth daily.  Marland Kitchen gabapentin (NEURONTIN) 300 MG capsule Take 1 capsule (300 mg total) by mouth 3 (three) times daily.  . hydrOXYzine (ATARAX/VISTARIL) 25 MG tablet Take 25 mg by mouth every 8 (eight) hours as needed.  Marland Kitchen  levothyroxine (SYNTHROID, LEVOTHROID) 88 MCG tablet Take 88 mcg by mouth daily before breakfast.  . meloxicam (MOBIC) 7.5 MG tablet Take 1 tablet (7.5 mg total) by mouth daily.  . mometasone (NASONEX) 50 MCG/ACT nasal spray 2 SPRAYS IN EACH NOSTRIL ONCE A DAY  . Multiple Vitamin (MULTIVITAMIN) capsule Take 1 capsule by mouth daily.  . pravastatin (PRAVACHOL) 40 MG tablet TAKE 1 TABLET BY MOUTH EVERYDAY AT BEDTIME   No facility-administered encounter medications on file as of 10/16/2017.     We did discuss holding fish oil and meloxicam prior to surgery.  She states she is not regularly taking aspirin but knows to hold that prior to surgery  Allergy: No Known Allergies  Social Hx:   Social History   Socioeconomic History  . Marital status: Married    Spouse name: Not on file  . Number of children: Not on file  . Years of education: Not on file  . Highest education level: Not on file  Occupational History  . Not on file  Social Needs  . Financial resource strain: Not on file  . Food insecurity:    Worry: Not on file    Inability: Not on file  . Transportation needs:    Medical: Not on file    Non-medical: Not on file  Tobacco Use  . Smoking status: Never Smoker  . Smokeless tobacco: Never Used  Substance and Sexual Activity  . Alcohol use: Yes    Comment: <10 / year, holidays only  . Drug use: No  . Sexual activity: Not on file  Lifestyle  . Physical activity:    Days per week: Not on file    Minutes per session: Not on file  . Stress: Not on file  Relationships  . Social connections:    Talks on phone: Not on file    Gets together: Not on file    Attends religious service: Not on file    Active member of club or organization: Not on file    Attends meetings of clubs or organizations: Not on file    Relationship status: Not on file  . Intimate partner violence:    Fear of current or ex partner: Not on file    Emotionally abused: Not on file    Physically abused:  Not on file    Forced sexual activity: Not on file  Other Topics Concern  . Not on file  Social History Narrative  . Not on file    Past Surgical Hx:  Past Surgical History:  Procedure Laterality Date  . BREAST LUMPECTOMY Right 2012  . BREAST LUMPECTOMY WITH AXILLARY LYMPH NODE BIOPSY Right 07/08/2010  . orthognathic sx  1991   due to accident as a child. sx to straighten dental bite.  Marland Kitchen PORT-A-CATH REMOVAL  12/28/2010   power port per patient    Past Medical Hx:  Past Medical History:  Diagnosis Date  . Arthritis   . Breast cancer, right ([place]) 2012  . Family history of BRCA2 gene positive   . Family history of cancer   . History of radiation therapy 02/2011  R breast  . Hypothyroid   . Osteopenia 08/09/2011  . Personal history of chemotherapy   . Personal history of radiation therapy     Past Gynecological History:   GYNECOLOGIC HISTORY:  No LMP recorded. Patient is postmenopausal. Menarche: 57 years old P 2 LMP 57 yo Contraceptive OCP and Mirena x ~10 years HRT vaginal estrogen, no oral HRT  Last Pap 07/2017 - negative with neg HPV  Family Hx:  Family History  Problem Relation Age of Onset  . Cancer Maternal Aunt 75       colon - great maternal aunt  . Cancer Paternal Aunt 27       gyn- unsure if ovarian or uterine- met to lung  . Breast cancer Maternal Aunt 29  . Breast cancer Mother 18  . Prostate cancer Paternal Uncle        dx >50  . Heart disease Maternal Grandmother 80  . Pneumonia Maternal Grandfather 33  . Lung cancer Paternal Grandmother 69  . Leukemia Paternal Grandfather 74  . Pancreatic cancer Paternal Aunt 104  . Bladder Cancer Paternal Aunt 103  . Lung cancer Paternal Aunt   . Other Cousin        BRCA2 +  . Breast cancer Cousin        dx <50  . Colon cancer Neg Hx     Review of Systems:  Review of Systems  Constitutional: Negative.   HENT:  Negative.   Eyes: Negative.   Respiratory: Negative.   Cardiovascular: Negative.    Gastrointestinal: Negative.   Endocrine: Negative.   Genitourinary: Positive for frequency.   Musculoskeletal: Negative.   Skin: Negative.   Neurological: Negative.   Hematological: Negative.   Psychiatric/Behavioral: Negative.   notes joint stiffness  Vitals:  Blood pressure 135/76, pulse 74, temperature 98.3 F (36.8 C), resp. rate 20, height _0  (1.6 m), weight 202 lb 9.6 oz (91.9 kg), SpO2 97 %. Body mass index is 35.89 kg/m.   Physical Exam: ECOG PERFORMANCE STATUS: 0 - Asymptomatic   General :  Well developed, 57 y.o., female in no apparent distress HEENT:  Normocephalic/atraumatic, symmetric, EOMI, eyelids normal Neck:   Supple, no masses.  Lymphatics:  No cervical/ submandibular/ supraclavicular/ infraclavicular/ inguinal adenopathy Respiratory:  Respirations unlabored, no use of accessory muscles CV:   Deferred Breast:  Deferred Musculoskeletal: No CVA tenderness, normal muscle strength. Abdomen:  Soft, non-tender and nondistended. No evidence of hernia. No masses. Extremities:  No lymphedema, no erythema, non-tender. Skin:   Normal inspection Neuro/Psych:  No focal motor deficit, no abnormal mental status. Normal gait. Normal affect. Alert and oriented to person, place, and time  Genito Urinary 07/17/2017: Vulva: Normal external female genitalia.  Bladder/urethra: Urethral meatus normal in size and location. No lesions or   masses, well supported bladder Speculum exam: Some mild pelvic relaxation Vagina: No lesion, no discharge, no bleeding. Cervix: Normal appearing, no lesions. Bimanual exam:  Uterus: Normal size, mobile.  Adnexa: No masses.  What feels to be a palpably normal left ovary ; right adnexa not definable but no masses Rectovaginal:  Good tone, no masses, no cul de sac nodularity, no parametrial involvement or nodularity.  Oncologic Summary: 1. BRCA2 mutation 2. Personal family history of breast cancer; with possible ovarian cancer in one family  member   Assessment/Plan: 3. BRCA2 mutation and risks of mullerian cancer o We discussed increased risk of ovarian cancer 15 to 25% and recommendations for consideration of prophylactic risk reducing salpingo-oophorectomy versus increased  screening o We also discussed the increased risk of primary peritoneal cancer over the population but still a very low risk that no additional screening would be recommended. 2. RRSO o She would like to proceed with prophylactic risk reducing salpingo-oophorectomy. o She understands that this will not address her slightly increased risk of a population of primary peritoneal cancer. 3. Hysterectomy o She would like to also have a hysterectomy o She understands this adds risk and is not necessary with the BRCA2 diagnosis. o Her history of Tamoxifen does increase her risk of uterine cancer but is not an indication to proceed o After considering the risks/benefits she would still like to proceed to hysterectomy. 4. 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 5. Surgical discussion o We reviewed the surgical sketch today along with the risk benefits and alternatives.  She was given a copy of this and one will be placed in her chart. 6. She asked me about mastectomy today and I defer that discussion to Dr. Jana Hakim. o As far as a combined surgery I think it will make scheduling difficult and not sure the surgeons will want me doing a clean-contaminated case concomitant with theirs. o I would defer to the surgeon if that would be recommended   Isabel Caprice, MD  10/16/2017, 12:17 PM  Cc: Lurline Del MD (Referring oncologist) Dr. Marda Stalker (PCP)

## 2017-11-27 ENCOUNTER — Encounter: Payer: Self-pay | Admitting: Adult Health

## 2017-11-27 ENCOUNTER — Inpatient Hospital Stay: Payer: BLUE CROSS/BLUE SHIELD | Attending: Oncology | Admitting: Adult Health

## 2017-11-27 VITALS — BP 143/91 | HR 79 | Temp 98.0°F | Resp 18 | Ht 63.0 in | Wt 201.7 lb

## 2017-11-27 DIAGNOSIS — M8580 Other specified disorders of bone density and structure, unspecified site: Secondary | ICD-10-CM | POA: Diagnosis not present

## 2017-11-27 DIAGNOSIS — Z853 Personal history of malignant neoplasm of breast: Secondary | ICD-10-CM

## 2017-11-27 DIAGNOSIS — Z1509 Genetic susceptibility to other malignant neoplasm: Secondary | ICD-10-CM

## 2017-11-27 DIAGNOSIS — Z170 Estrogen receptor positive status [ER+]: Secondary | ICD-10-CM

## 2017-11-27 DIAGNOSIS — Z1501 Genetic susceptibility to malignant neoplasm of breast: Secondary | ICD-10-CM

## 2017-11-27 DIAGNOSIS — C50411 Malignant neoplasm of upper-outer quadrant of right female breast: Secondary | ICD-10-CM

## 2017-11-27 NOTE — Progress Notes (Signed)
ID: [name]   DOB: [birth year]  MR#: [id number]  SHF#:[id number]   PCP:  [name], MD GYN: [name] M.D. SU:  [name], MD OTHER:   [name], MD  CHIEF COMPLAINT:  Right Breast Cancer with BRCA2 mutation CURRENT TREATMENT: tamoxifen    HISTORY OF PRESENT ILLNESS: From the original intake note:  [name] had screening mammography June 09, 2010 at the breast center.  This suggested a possible abnormality in the right breast.  She was brought back for additional views on April 4.  Dr. [name] was able to palpate a firm mobile mass at the 9 o'clock position 7 cm from the nipple.  Compression views confirmed an ill-defined mass with punctate and linear calcifications.  Ultrasound found this to be hypoechoic and to measure 1 cm.  In addition there was a lymph node with a diffusely thickened cortex measuring 1.5 cm in the right axilla.  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  With this information the patient was set up for breast MRI on April 13.  This showed in the posterior third of the upper outer quadrant of the right breast an irregular mass measuring 1.8 cm.  There was no multifocal or multicentric disease and there were at least two right axillary lymph nodes with thickened cortices.  There was no left axillary or left breast mass, no internal mammary chain adenopathy.  She proceeded to definitive therapy as detailed below.  INTERVAL HISTORY:  [name] returns today for follow-up and treatment of her breast cancer. She is accompanied by her husband. [name] is here mainly to discuss her BRCA 2 positivity.  She met with Dr. Gerarda Fraction and  wants to re review recommendations with [place] and wants to consider surgery  REVIEW OF SYSTEMS:  [name] is doing well.  She denies any issues today.  She is concerned about her choice for surgery and timing and whether or not she should have risk reducing mastectomies as well.   PAST MEDICAL HISTORY: Past Medical History:  Diagnosis Date  . Arthritis   . Breast cancer, right ([place]) 2012  . Family history of BRCA2 gene positive   . Family history of cancer   . History of radiation therapy 02/2011   R breast  . Hypothyroid   . Osteopenia 08/09/2011  . Personal history of chemotherapy   . Personal history of radiation therapy     PAST SURGICAL HISTORY: Past Surgical History:  Procedure Laterality Date  . BREAST LUMPECTOMY Right 2012  . BREAST LUMPECTOMY WITH AXILLARY LYMPH NODE BIOPSY Right 07/08/2010  . orthognathic sx  1991   due to accident as a child. sx to straighten dental bite.  Marland Kitchen PORT-A-CATH REMOVAL  12/28/2010   power port per patient    FAMILY HISTORY Family History  Problem Relation Age of Onset  . Cancer Maternal Aunt 75       colon - great maternal aunt  . Cancer Paternal Aunt 85       gyn- unsure if ovarian or uterine- met to lung  . Breast cancer Maternal Aunt 39  . Breast cancer Mother 69  . Prostate cancer Paternal  Uncle        dx >50  . Heart disease Maternal Grandmother 80  . Pneumonia Maternal Grandfather 2  . Lung cancer Paternal Grandmother 25  . Leukemia Paternal Grandfather 11  . Pancreatic cancer Paternal Aunt 25  . Bladder Cancer Paternal Aunt 38  . Lung cancer Paternal Aunt   . Other Cousin        BRCA2 +  . Breast cancer Cousin        dx <50  . Colon cancer Neg Hx   The patient's father died 05/06/2015 apparently following multiple strokes. Patient's mother is passed away 2015/09/03, two weeks after being diagnosed with breast cancer, but she notes that her mother knew about a lump in her breast for 2 years and did not bring this to medical  attention. She has no sisters, and had one brother, who is now deceased. She has a maternal aunt who was diagnosed with breast cancer before the age of 11 and a paternal aunt who was diagnosed with ovarian cancer before the age of 80.  GYNECOLOGIC HISTORY: Menarche age 36, menopause about five to seven years ago.  She has been on hormone replacement for various periods most recently started estradiol June 10, 2010.  She never had any complications from any of the hormones she took and she has a Mirena IUD in place.  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  The [employer] brings them the hens, they gather the eggs and then [employer] sets the eggs up to hatch.  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 They have two daughters "[name]" [name], 30, who works in [place], but lives at home and [name] "Claire" Barnes, 25, who has graduated from [employer]. Manuelita notes that her mother passed away in 02-Sep-2016, two weeks after being diagnosed with breast cancer.   ADVANCED DIRECTIVES: not in place  HEALTH MAINTENANCE: (updated 01/17/2013) Social History   Tobacco Use  . Smoking status: Never Smoker  . Smokeless tobacco: Never Used  Substance Use Topics  . Alcohol use: Yes    Comment: <10 / year, holidays only  . Drug use: No     Colonoscopy: Never  PAP:  Bone density:  Osteopenia, May 2013  Lipid panel: Dr. Charlean Merl    No Known Allergies  Current Outpatient Medications  Medication Sig Dispense Refill  . alendronate (FOSAMAX) 70 MG tablet Take 1 tablet (70 mg total) by mouth once a week. Take with a full glass of water on an empty stomach.    Marland Kitchen anastrozole (ARIMIDEX) 1 MG tablet Take 1 tablet (1 mg total) by mouth daily. 90 tablet 4  . aspirin 81 MG tablet Take 81 mg by mouth daily.    . chlorpheniramine (CHLOR-TRIMETON) 4 MG tablet Take 1 tablet (4 mg total) by mouth 2 (two)  times daily as needed for allergies. 14 tablet 0  . citalopram (CELEXA) 40 MG tablet Take 40 mg by mouth daily. Taking 40 mg Daily    . fish oil-omega-3 fatty acids 1000 MG capsule Take 1 g by mouth daily.    Marland Kitchen gabapentin (NEURONTIN) 300 MG capsule Take 1 capsule (300 mg total) by mouth 3 (three) times daily. 90 capsule 3  . hydrOXYzine (ATARAX/VISTARIL) 25 MG tablet Take 25 mg by mouth every 8 (eight) hours as needed.  0  . levothyroxine (SYNTHROID, LEVOTHROID) 88 MCG  tablet Take 88 mcg by mouth daily before breakfast.    . meloxicam (MOBIC) 7.5 MG tablet Take 7.5 mg by mouth daily. Take 2 tabs in the morning    . mometasone (NASONEX) 50 MCG/ACT nasal spray 2 SPRAYS IN EACH NOSTRIL ONCE A DAY  1  . Multiple Vitamin (MULTIVITAMIN) capsule Take 1 capsule by mouth daily.    . pravastatin (PRAVACHOL) 40 MG tablet TAKE 1 TABLET BY MOUTH EVERYDAY AT BEDTIME  0   No current facility-administered medications for this visit.     OBJECTIVE:   Vitals:   11/27/17 1428  BP: (!) 143/91  Pulse: 79  Resp: 18  Temp: 98 F (36.7 C)  SpO2: 98%     Body mass index is 35.73 kg/m.    ECOG FS: 1  Filed Weights   11/27/17 1428  Weight: 201 lb 11.2 oz (91.5 kg)   General: well appearing no acute distress Skin: no rash Psych: normal affect, appears anxious Declines exam today    LAB RESULTS: Lab Results  Component Value Date   WBC 8.2 04/10/2017   NEUTROABS 4.9 04/10/2017   HGB 13.6 04/10/2017   HCT 39.8 04/10/2017   MCV 89.4 04/10/2017   PLT 184 04/10/2017      Chemistry      Component Value Date/Time   NA 138 04/10/2017 1305   NA 142 08/05/2014 0818   K 3.9 04/10/2017 1305   K 3.8 08/05/2014 0818   CL 105 04/10/2017 1305   CL 105 07/09/2012 0808   CO2 25 04/10/2017 1305   CO2 23 08/05/2014 0818   BUN 10 04/10/2017 1305   BUN 10.7 08/05/2014 0818   CREATININE 0.76 04/10/2017 1305   CREATININE 0.8 08/05/2014 0818      Component Value Date/Time   CALCIUM 9.4 04/10/2017 1305    CALCIUM 8.5 08/05/2014 0818   ALKPHOS 66 04/10/2017 1305   ALKPHOS 62 08/05/2014 0818   AST 21 04/10/2017 1305   AST 22 08/05/2014 0818   ALT 20 04/10/2017 1305   ALT 20 08/05/2014 0818   BILITOT 0.6 04/10/2017 1305   BILITOT 0.59 08/05/2014 0818       STUDIES: Mammography scheduled for April 2019 and MRI scheduled for October 2019  ASSESSMENT: 57 y.o. BRCA2 positive [place] woman   (1)  status post right lumpectomy and axillary lymph node dissection April 2012 for a T1c N1 M0, stage IIA invasive ductal carcinoma, grade 3, strongly estrogen and progesterone receptor positive, HER-2 negative with an MIB-1 of 26%,   (2)  treated adjuvantly with 4 cycles of dose dense doxorubicin and cyclophosphamide followed by 9 weekly doses of paclitaxel   (3)  radiation completed December of 2012.   (4)  She started letrozole 03/15/2011, switched to tamoxifen as of January 2016  (a) completed 5 years of antiestrogens February 2018  (5) Osteopenia, started alendronate (Fosamax) May 2013  (6) genetics testing 05/11/2017 through the Common Hereditary Cancers Panel + Myelodysplastic syndrome/Leukemia Panel was ordered from [facility] found a Positive: Pathogeic variant in BRCA2 c.6275_6276del (p.Leu2092Profs*7).   (a) there were no pathogenic mutations noted in APC, ATM, AXIN2, BARD1, BLM, BMPR1A, BRCA1, BRIP1, CDH1, CDK4, CDKN2A (p14ARF), CDKN2A (p16INK4a), CEBPA, CHEK2, CTNNA1, DICER1, EPCAM*, GATA2, GREM1*, HRAS, KIT, MEN1, MLH1, MSH2, MSH3, MSH6, MUTYH, NBN, NF1, PALB2, PDGFRA, PMS2, POLD1, POLE, PTEN, RAD50, RAD51C, RAD51D, RUNX1, SDHB, SDHC, SDHD, SMAD4, SMARCA4, STK11, TERC, TERT, TP53, TSC1, TSC2, VHL. The following genes were evaluated for sequence changes only:HOXB13*, NTHL1*, SDHA.  (b)  A variant of uncertain significance in the gene MSH3 was also identified c.2695A>G (p.Met899Val).   (7) intensified screening:   (a) mammography every April  (b) breast MRI every October  (c) by annual breast  exam  (8) breast cancer risk reduction: Anastrozole started 06/19/2017  (9) ovarian cancer risk reduction: BSO planned   PLAN:  Meila has been found to carry a deleterious BRCA2 mutation.  She discussed that in detail with Dr. Jana Hakim in detail at her last appointment with him.  She has considered input from Dr.Phelps and her PCP.  She reviewed that she is reconsidering bilateral mastectomies and reconstruction.  We talked about risk reducing surgery versus Anastrozole.  We reviewed the risks/benefits of each option.  We reviewed TAH/BSO and its purpose and benefit.  We discussed the potential for reconstruction.  We reviewed the question of timing of the surgeries, as she would like them to fall in the same calendar year.    After detailed review we then discussed the matter of breast surgery and who she would like to see.  Dr. Barry Dienes performed [place]'s lumpectomy.  She very much prefers to see her in clinic to discuss further.  I will reach out to Dr. Barry Dienes with this request and we will get her in.  Deserie will discuss further with Dr. Barry Dienes and then make her final decision.    A total of (30) minutes of face-to-face time was spent with this patient with greater than 50% of that time in counseling and care-coordination.    Wilber Bihari, NP  11/27/17 2:50 PM Medical Oncology and Hematology [facility] [address] [place], [place] [address] Tel. [phone]    Fax. [phone]

## 2017-12-15 ENCOUNTER — Ambulatory Visit: Payer: BLUE CROSS/BLUE SHIELD | Admitting: Obstetrics

## 2017-12-20 ENCOUNTER — Encounter (HOSPITAL_COMMUNITY): Payer: Self-pay

## 2017-12-20 NOTE — Patient Instructions (Signed)
Your procedure is scheduled on: Wednesday, Oct. 30, 2019   Surgery Time:  2:00PM-4:30PM   Report to [facility]  Entrance    Report to admitting at 12 noon   Call this number if you have problems the morning of surgery [phone]   Eat a light diet the day before surgery. Examples including soups, broths, toast, yogurt, mashed potatoes. Things to avoid include carbonated beverages (fizzy beverages), raw fruits and raw vegetables, or beans.    Only clear liquids after midnight until 11:00AM morning of surgery  CLEAR LIQUID DIET   Foods Allowed                                                                     Foods Excluded  Coffee and tea, regular and decaf                             liquids that you cannot  Plain Jell-O in any flavor                                             see through such as: Fruit ices (not with fruit pulp)                                     milk, soups, orange juice  Iced Popsicles                                    All solid food Carbonated beverages, regular and diet                                    Cranberry, grape and apple juices Sports drinks like Gatorade Lightly seasoned clear broth or consume(fat free) Sugar, honey syrup  Sample Menu Breakfast                                Lunch                                     Supper Cranberry juice                    Beef broth                            Chicken broth Jell-O                                     Grape juice  Apple juice Coffee or tea                        Jell-O                                      Popsicle                                                Coffee or tea                        Coffee or tea    Complete one Ensure drink the morning of surgery by 11:00AM the day of surgery.    Brush your teeth the morning of surgery.   Do NOT smoke after Midnight   Take these medicines the morning of surgery with A SIP OF WATER:  Anastrozole, Citalopram, Gabapentin, Levothyroxine, Hydroxyzine if neeed                               You may not have any metal on your body including hair pins, jewelry, and body piercings             Do not wear make-up, lotions, powders, perfumes/cologne, or deodorant             Do not wear nail polish.  Do not shave  48 hours prior to surgery.                Do not bring valuables to the hospital. [place].   Contacts, dentures or bridgework may not be worn into surgery.   Leave suitcase in the car. After surgery it may be brought to your room.    Special Instructions: Bring a copy of your healthcare power of attorney and living will documents         the day of surgery if you haven't scanned them in before.              Please read over the following fact sheets you were given:  [facility] - Preparing for Surgery Before surgery, you can play an important role.  Because skin is not sterile, your skin needs to be as free of germs as possible.  You can reduce the number of germs on your skin by washing with CHG (chlorahexidine gluconate) soap before surgery.  CHG is an antiseptic cleaner which kills germs and bonds with the skin to continue killing germs even after washing. Please DO NOT use if you have an allergy to CHG or antibacterial soaps.  If your skin becomes reddened/irritated stop using the CHG and inform your nurse when you arrive at Short Stay. Do not shave (including legs and underarms) for at least 48 hours prior to the first CHG shower.  You may shave your face/neck.  Please follow these instructions carefully:  1.  Shower with CHG Soap the night before surgery and the  morning of surgery.  2.  If you choose to wash your hair, wash your hair first as usual with your normal  shampoo.  3.  After you shampoo, rinse your hair and body thoroughly to remove the shampoo.                             4.  Use CHG as you would any  other liquid soap.  You can apply chg directly to the skin and wash.  Gently with a scrungie or clean washcloth.  5.  Apply the CHG Soap to your body ONLY FROM THE NECK DOWN.   Do   not use on face/ open                           Wound or open sores. Avoid contact with eyes, ears mouth and   genitals (private parts).                       Wash face,  Genitals (private parts) with your normal soap.             6.  Wash thoroughly, paying special attention to the area where your    surgery  will be performed.  7.  Thoroughly rinse your body with warm water from the neck down.  8.  DO NOT shower/wash with your normal soap after using and rinsing off the CHG Soap.                9.  Pat yourself dry with a clean towel.            10.  Wear clean pajamas.            11.  Place clean sheets on your bed the night of your first shower and do not  sleep with pets. Day of Surgery : Do not apply any lotions/deodorants the morning of surgery.  Please wear clean clothes to the hospital/surgery center.  FAILURE TO FOLLOW THESE INSTRUCTIONS MAY RESULT IN THE CANCELLATION OF YOUR SURGERY  PATIENT SIGNATURE_________________________________  NURSE SIGNATURE__________________________________  ________________________________________________________________________   Adam Phenix  An incentive spirometer is a tool that can help keep your lungs clear and active. This tool measures how well you are filling your lungs with each breath. Taking long deep breaths may help reverse or decrease the chance of developing breathing (pulmonary) problems (especially infection) following:  A long period of time when you are unable to move or be active. BEFORE THE PROCEDURE   If the spirometer includes an indicator to show your best effort, your nurse or respiratory therapist will set it to a desired goal.  If possible, sit up straight or lean slightly forward. Try not to slouch.  Hold the incentive spirometer  in an upright position. INSTRUCTIONS FOR USE  1. Sit on the edge of your bed if possible, or sit up as far as you can in bed or on a chair. 2. Hold the incentive spirometer in an upright position. 3. Breathe out normally. 4. Place the mouthpiece in your mouth and seal your lips tightly around it. 5. Breathe in slowly and as deeply as possible, raising the piston or the ball toward the top of the column. 6. Hold your breath for 3-5 seconds or for as long as possible. Allow the piston or ball to fall to the bottom of the column. 7. Remove the mouthpiece from your mouth and breathe out normally. 8. Rest for a few seconds and repeat Steps 1 through 7 at least 10  times every 1-2 hours when you are awake. Take your time and take a few normal breaths between deep breaths. 9. The spirometer may include an indicator to show your best effort. Use the indicator as a goal to work toward during each repetition. 10. After each set of 10 deep breaths, practice coughing to be sure your lungs are clear. If you have an incision (the cut made at the time of surgery), support your incision when coughing by placing a pillow or rolled up towels firmly against it. Once you are able to get out of bed, walk around indoors and cough well. You may stop using the incentive spirometer when instructed by your caregiver.  RISKS AND COMPLICATIONS  Take your time so you do not get dizzy or light-headed.  If you are in pain, you may need to take or ask for pain medication before doing incentive spirometry. It is harder to take a deep breath if you are having pain. AFTER USE  Rest and breathe slowly and easily.  It can be helpful to keep track of a log of your progress. Your caregiver can provide you with a simple table to help with this. If you are using the spirometer at home, follow these instructions: [place] IF:   You are having difficultly using the spirometer.  You have trouble using the spirometer as  often as instructed.  Your pain medication is not giving enough relief while using the spirometer.  You develop fever of 100.5 F (38.1 C) or higher. SEEK IMMEDIATE MEDICAL CARE IF:   You cough up bloody sputum that had not been present before.  You develop fever of 102 F (38.9 C) or greater.  You develop worsening pain at or near the incision site. MAKE SURE YOU:   Understand these instructions.  Will watch your condition.  Will get help right away if you are not doing well or get worse. Document Released: 07/11/2006 Document Revised: 05/23/2011 Document Reviewed: 09/11/2006 [employer] Patient Information 2014 [employer], [place].   ________________________________________________________________________  WHAT IS A BLOOD TRANSFUSION? Blood Transfusion Information  A transfusion is the replacement of blood or some of its parts. Blood is made up of multiple cells which provide different functions.  Red blood cells carry oxygen and are used for blood loss replacement.  White blood cells fight against infection.  Platelets control bleeding.  Plasma helps clot blood.  Other blood products are available for specialized needs, such as hemophilia or other clotting disorders. BEFORE THE TRANSFUSION  Who gives blood for transfusions?   Healthy volunteers who are fully evaluated to make sure their blood is safe. This is blood bank blood. Transfusion therapy is the safest it has ever been in the practice of medicine. Before blood is taken from a donor, a complete history is taken to make sure that person has no history of diseases nor engages in risky social behavior (examples are intravenous drug use or sexual activity with multiple partners). The donor's travel history is screened to minimize risk of transmitting infections, such as malaria. The donated blood is tested for signs of infectious diseases, such as HIV and hepatitis. The blood is then tested to be sure it is compatible with  you in order to minimize the chance of a transfusion reaction. If you or a relative donates blood, this is often done in anticipation of surgery and is not appropriate for emergency situations. It takes many days to process the donated blood. RISKS AND COMPLICATIONS Although transfusion therapy is  very safe and saves many lives, the main dangers of transfusion include:   Getting an infectious disease.  Developing a transfusion reaction. This is an allergic reaction to something in the blood you were given. Every precaution is taken to prevent this. The decision to have a blood transfusion has been considered carefully by your caregiver before blood is given. Blood is not given unless the benefits outweigh the risks. AFTER THE TRANSFUSION  Right after receiving a blood transfusion, you will usually feel much better and more energetic. This is especially true if your red blood cells have gotten low (anemic). The transfusion raises the level of the red blood cells which carry oxygen, and this usually causes an energy increase.  The nurse administering the transfusion will monitor you carefully for complications. HOME CARE INSTRUCTIONS  No special instructions are needed after a transfusion. You may find your energy is better. Speak with your caregiver about any limitations on activity for underlying diseases you may have. SEEK MEDICAL CARE IF:   Your condition is not improving after your transfusion.  You develop redness or irritation at the intravenous (IV) site. SEEK IMMEDIATE MEDICAL CARE IF:  Any of the following symptoms occur over the next 12 hours:  Shaking chills.  You have a temperature by mouth above 102 F (38.9 C), not controlled by medicine.  Chest, back, or muscle pain.  People around you feel you are not acting correctly or are confused.  Shortness of breath or difficulty breathing.  Dizziness and fainting.  You get a rash or develop hives.  You have a decrease in  urine output.  Your urine turns a dark color or changes to pink, red, or brown. Any of the following symptoms occur over the next 10 days:  You have a temperature by mouth above 102 F (38.9 C), not controlled by medicine.  Shortness of breath.  Weakness after normal activity.  The white part of the eye turns yellow (jaundice).  You have a decrease in the amount of urine or are urinating less often.  Your urine turns a dark color or changes to pink, red, or brown. Document Released: 02/26/2000 Document Revised: 05/23/2011 Document Reviewed: 10/15/2007 [facility] Patient Information 2014 [place], [place].  _______________________________________________________________________

## 2017-12-22 ENCOUNTER — Other Ambulatory Visit: Payer: Self-pay | Admitting: *Deleted

## 2017-12-22 ENCOUNTER — Telehealth: Payer: Self-pay

## 2017-12-22 DIAGNOSIS — Z1501 Genetic susceptibility to malignant neoplasm of breast: Principal | ICD-10-CM

## 2017-12-22 DIAGNOSIS — C50911 Malignant neoplasm of unspecified site of right female breast: Secondary | ICD-10-CM

## 2017-12-22 NOTE — Telephone Encounter (Signed)
[name] called stating that she would like to have a BSO rather then a BSO and total hysterectomy with Dr. Gerarda Fraction on 01-10-18. [name], NP notified to change the surgical posting.

## 2017-12-24 NOTE — Progress Notes (Signed)
ID: [name]   DOB: February 02, 1961  MR#: [id number]  IFO#:[id number]   PCP:  Claire Croon, MD GYN: [name] M.D. SU:  Claire Klein, MD OTHER:   Claire Rudd, MD  CHIEF COMPLAINT:  Right Breast Cancer with BRCA2 mutation  CURRENT TREATMENT: tamoxifen    HISTORY OF PRESENT ILLNESS: From the original intake note:  [name] had screening mammography June 09, 2010 at the breast center.  This suggested a possible abnormality in the right breast.  She was brought back for additional views on April 4.  Dr. Emily Barnes was able to palpate a firm mobile mass at the 9 o'clock position 7 cm from the nipple.  Compression views confirmed an ill-defined mass with punctate and linear calcifications.  Ultrasound found this to be hypoechoic and to measure 1 cm.  In addition there was a lymph node with a diffusely thickened cortex measuring 1.5 cm in the right axilla.  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  With this information the patient was set up for breast MRI on April 13.  This showed in the posterior third of the upper outer quadrant of the right breast an irregular mass measuring 1.8 cm.  There was no multifocal or multicentric disease and there were at least two right axillary lymph nodes with thickened cortices.  There was no left axillary or left breast mass, no internal mammary chain adenopathy.  She proceeded to definitive therapy as detailed below.  INTERVAL HISTORY:  [name] returns today for follow-up and treatment of her breast cancer. She has been doing well overall. She continues on anastrozole, with good tolerance. She has hot flashes daily (most just  one episode, some days with multiple episodes). She notes that her hot flashes are specific to a time during the day. Her hot flashes pass quickly and she isn't drenching with sweat. She is taking gabapentin TID.   Since her last visit to the office, she underwent a diagnostic bilateral mammogram on 07/20/2017 showed: Breast density category B. No mammographic evidence of malignancy in either breast.   She is scheduled for a bilateral salpingo-oophorectomy on 01/10/2018 which will be performed by Dr. Precious Barnes. She is also scheduled for a MR Bilateral breast on 01/29/2018.    REVIEW OF SYSTEMS:  [name] reports that for exercise, she works on her chicken farm. She is very active in her day-to-day life. She denies unusual headaches, visual changes, nausea, vomiting, or dizziness. There has been no unusual cough, phlegm production, or pleurisy. This been no change in bowel or bladder habits. She denies unexplained fatigue or unexplained weight loss, bleeding, rash, or fever. A detailed review of systems was otherwise stable.     PAST MEDICAL HISTORY: Past Medical History:  Diagnosis Date  . Anxiety   . Arthritis   . Breast cancer, right ([place]) 2012  . Chigger bite 12/24/2017  . De Quervain's syndrome (tenosynovitis)    Left wrist  . Depression   . Family history of BRCA2 gene positive   . Family history of cancer   . Fibromyalgia   . History of radiation therapy 02/2011   R breast  . History  of TMJ syndrome   . Hyperlipidemia   . Hypothyroid   . Left ankle injury 12/24/2017   swollen  . Obese   . Osteopenia 08/09/2011  . 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 Personal history of chemotherapy   . Personal history of radiation therapy     PAST SURGICAL HISTORY: Past Surgical History:  Procedure Laterality Date  . BREAST LUMPECTOMY WITH AXILLARY LYMPH NODE BIOPSY Right 07/08/2010  . MANDIBLE SURGERY  1991   due to accident as a child. sx to straighten dental bite.  .  port a cath insertion    . PORT-A-CATH REMOVAL  12/28/2010   power port per patient    FAMILY HISTORY Family History  Problem Relation Age of Onset  . Cancer Maternal Aunt 75       colon - great maternal aunt  . Cancer Paternal Aunt 41       gyn- unsure if ovarian or uterine- met to lung  . Breast cancer Maternal Aunt 77  . Breast cancer Mother 31  . Prostate cancer Paternal Uncle        dx >50  . Heart disease Maternal Grandmother 80  . Pneumonia Maternal Grandfather 35  . Lung cancer Paternal Grandmother 42  . Leukemia Paternal Grandfather 65  . Pancreatic cancer Paternal Aunt 25  . Bladder Cancer Paternal Aunt 83  . Lung cancer Paternal Aunt   . Other Cousin        BRCA2 +  . Breast cancer Cousin        dx <50  . Colon cancer Neg Hx   The patient's father died 16-May-2015 apparently following multiple strokes. Patient's mother is passed away 13-Sep-2015, two weeks after being diagnosed with breast cancer, but she notes that her mother knew about a lump in her breast for 2 years and did not bring this to medical attention. She has no sisters, and had one brother, who is now deceased. She has a maternal aunt who was diagnosed with breast cancer before the age of 70 and a paternal aunt who was diagnosed with ovarian cancer before the age of 37.  GYNECOLOGIC HISTORY: Menarche age 53, menopause about five to seven years ago.  She has been on hormone replacement for various periods most recently started estradiol June 10, 2010.  She never had any complications from any of the hormones she took and she has a Mirena IUD in place.  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  The [employer] brings them the hens, they gather the eggs and then [employer] sets the eggs up to hatch.  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 They have two daughters "[name]" [name], 30, who works in [employer], but  lives at home and [name] "Claire" Balch Barnes, 25, who has graduated from [employer]. Ova notes that her mother passed away in 09-12-2016, two weeks after being diagnosed with breast cancer.   ADVANCED DIRECTIVES: not in place  HEALTH MAINTENANCE: (updated 01/17/2013) Social History   Tobacco Use  . Smoking status: Never Smoker  . Smokeless tobacco: Never Used  Substance Use Topics  . Alcohol use: Yes    Comment: <10 / year, holidays only  . Drug use: No     Colonoscopy: Never  PAP:  Bone density:  Osteopenia, May 2013  Lipid panel: Dr. Charlean Merl    No  Known Allergies  Current Outpatient Medications  Medication Sig Dispense Refill  . alendronate (FOSAMAX) 35 MG tablet Take 35 mg by mouth every Wednesday. Take with a full glass of water on an empty stomach.    Marland Kitchen anastrozole (ARIMIDEX) 1 MG tablet Take 1 tablet (1 mg total) by mouth daily. 90 tablet 4  . chlorpheniramine (CHLOR-TRIMETON) 4 MG tablet Take 4 mg by mouth daily as needed for allergies.  14 tablet 0  . citalopram (CELEXA) 40 MG tablet Take 40 mg by mouth daily.     . Cyanocobalamin (VITAMIN B-12) 1000 MCG SUBL Place 1,000 mcg under the tongue daily.    Marland Kitchen gabapentin (NEURONTIN) 300 MG capsule Take 1 capsule (300 mg total) by mouth 3 (three) times daily. 90 capsule 3  . hydrOXYzine (ATARAX/VISTARIL) 25 MG tablet Take 25 mg by mouth at bedtime as needed for anxiety.   0  . levothyroxine (SYNTHROID, LEVOTHROID) 88 MCG tablet Take 88 mcg by mouth daily before breakfast.    . meloxicam (MOBIC) 7.5 MG tablet Take 15 mg by mouth daily.     . Menthol, Topical Analgesic, (BLUE-EMU MAXIMUM STRENGTH EX) Apply 1 application topically daily as needed (for knee pain).    . mometasone (NASONEX) 50 MCG/ACT nasal spray Place 2 sprays into the nose at bedtime.   1  . Multiple Vitamin (MULTIVITAMIN) capsule Take 1 capsule by mouth 2 (two) times a week.     . pravastatin (PRAVACHOL) 40 MG tablet Take 40 mg by mouth at bedtime.   0    No current facility-administered medications for this visit.     OBJECTIVE: Middle-aged white Barnes who appears well  Vitals:   12/25/17 1154  BP: 129/70  Pulse: 77  Resp: 18  Temp: 98.3 F (36.8 C)  SpO2: 97%     Body mass index is 35.8 kg/m.    ECOG FS: 1  Filed Weights   12/25/17 1154  Weight: 202 lb 1.6 oz (91.7 kg)   Sclerae unicteric, pupils round and equal Oropharynx clear and moist No cervical or supraclavicular adenopathy Lungs no rales or rhonchi Heart regular rate and rhythm Abd soft, nontender, positive bowel sounds MSK no focal spinal tenderness, no upper extremity lymphedema Neuro: nonfocal, well oriented, appropriate affect Breasts: Right breast is status prior lumpectomy and radiation.  There is no evidence of local recurrence.  I do not palpate any masses in the areas where she feels a little tenderness.  The left breast is benign.  Both axillae are benign.    LAB RESULTS: Lab Results  Component Value Date   WBC 9.5 12/25/2017   NEUTROABS 4.9 04/10/2017   HGB 13.5 12/25/2017   HCT 40.3 12/25/2017   MCV 91.6 12/25/2017   PLT 175 12/25/2017      Chemistry      Component Value Date/Time   NA 138 12/25/2017 1106   NA 142 08/05/2014 0818   K 3.9 12/25/2017 1106   K 3.8 08/05/2014 0818   CL 105 12/25/2017 1106   CL 105 07/09/2012 0808   CO2 25 12/25/2017 1106   CO2 23 08/05/2014 0818   BUN 13 12/25/2017 1106   BUN 10.7 08/05/2014 0818   CREATININE 0.62 12/25/2017 1106   CREATININE 0.8 08/05/2014 0818      Component Value Date/Time   CALCIUM 9.1 12/25/2017 1106   CALCIUM 8.5 08/05/2014 0818   ALKPHOS 57 12/25/2017 1106   ALKPHOS 62 08/05/2014 0818   AST 19 12/25/2017 1106   AST  22 08/05/2014 0818   ALT 17 12/25/2017 1106   ALT 20 08/05/2014 0818   BILITOT 1.1 12/25/2017 1106   BILITOT 0.59 08/05/2014 0818       STUDIES: Diagnostic bilateral mammogram on 07/20/2017 showed: Breast density category B. No mammographic evidence of  malignancy in either breast.   ASSESSMENT: 57 y.o. BRCA2 positive [name]   (1)  status post right lumpectomy and axillary lymph node dissection April 2012 for a T1c N1 M0, stage IIA invasive ductal carcinoma, grade 3, strongly estrogen and progesterone receptor positive, HER-2 negative with an MIB-1 of 26%,   (2)  treated adjuvantly with 4 cycles of dose dense doxorubicin and cyclophosphamide followed by 9 weekly doses of paclitaxel   (3)  radiation completed December of 2012.   (4)  She started letrozole 03/15/2011, switched to tamoxifen as of January 2016  (a) completed 5 years of antiestrogens February 2018  (5) Osteopenia, started alendronate (Fosamax) May 2013  (6) genetics testing 05/11/2017 through the Common Hereditary Cancers Panel + Myelodysplastic syndrome/Leukemia Panel was ordered from [facility] found a Positive: Pathogeic variant in BRCA2 c.[id number] (p.Leu2092Profs*7).   (a) there were no pathogenic mutations noted in APC, ATM, AXIN2, BARD1, BLM, BMPR1A, BRCA1, BRIP1, CDH1, CDK4, CDKN2A (p14ARF), CDKN2A (p16INK4a), CEBPA, CHEK2, CTNNA1, DICER1, EPCAM*, GATA2, GREM1*, HRAS, KIT, MEN1, MLH1, MSH2, MSH3, MSH6, MUTYH, NBN, NF1, PALB2, PDGFRA, PMS2, POLD1, POLE, PTEN, RAD50, RAD51C, RAD51D, RUNX1, SDHB, SDHC, SDHD, SMAD4, SMARCA4, STK11, TERC, TERT, TP53, TSC1, TSC2, VHL. The following genes were evaluated for sequence changes only:HOXB13*, NTHL1*, SDHA.  (b) A variant of uncertain significance in the gene MSH3 was also identified c.2695A>G (p.Met899Val).   (7) intensified screening:   (a) mammography every April  (b) breast MRI every October (scheduled for 01/29/2018)  (c) bi-annual breast exam  (8) breast cancer risk reduction: Anastrozole started 06/19/2017  (a) bone density agrees per imaging 07/31/2013 showed a T score -2.2  (9) ovarian cancer risk reduction: BSO planned 01/10/2018   PLAN:  [name] is now 7-1/2 years out from definitive surgery for her breast  cancer, with no evidence of disease recurrence.  This is very favorable.  She is on antiestrogens for prevention given her BRCA positivity.  She is tolerating anastrozole quite well other than the issue with hot flashes.  We discussed various ways of treating that.  If she does develop some blood pressure issues we could add TTS 1.  At this point she wants to just "hold on".  She has decided I think correctly to proceed to bilateral salpingo-oophorectomy but not hysterectomy.  This is scheduled for later this month.  She has been told to get off her nonsteroidal pain medicines.  I went ahead and wrote her for a few tramadol that she may take for the 7 to 10 days preop when she supposed to be off her usual pain medications.  She understands this may well constipate her  Schedule for bilateral breast MRI in November.  She is considering bilateral mastectomies at some point but not in the near future.  She has excellent follow-up through her gynecologist Dr. Milta Deiters, and her primary care physician Dr. Billee Cashing.  Accordingly she will return to see me in 1 year  She knows to call for any other issues that may develop before the next visit here.  Jahdiel Krol, Virgie Dad, MD  12/25/17 12:15 PM Medical Oncology and Hematology [facility] [address] [place], [place] [address] Tel. [phone]    Fax. [phone]  I, Soijett Blue am acting as scribe for Dr. Sarajane Jews C. Kassi Esteve.  I, Lurline Del MD, have reviewed the above documentation for accuracy and completeness, and I agree with the above.

## 2017-12-25 ENCOUNTER — Telehealth: Payer: Self-pay | Admitting: Oncology

## 2017-12-25 ENCOUNTER — Inpatient Hospital Stay (HOSPITAL_BASED_OUTPATIENT_CLINIC_OR_DEPARTMENT_OTHER): Payer: BLUE CROSS/BLUE SHIELD | Admitting: Oncology

## 2017-12-25 ENCOUNTER — Encounter (HOSPITAL_COMMUNITY): Payer: Self-pay

## 2017-12-25 ENCOUNTER — Encounter (HOSPITAL_COMMUNITY)
Admission: RE | Admit: 2017-12-25 | Discharge: 2017-12-25 | Disposition: A | Discharge status: Home / Self Care | Payer: BLUE CROSS/BLUE SHIELD | Source: Ambulatory Visit | Attending: Obstetrics | Admitting: Obstetrics

## 2017-12-25 ENCOUNTER — Other Ambulatory Visit: Payer: Self-pay

## 2017-12-25 ENCOUNTER — Inpatient Hospital Stay: Payer: BLUE CROSS/BLUE SHIELD | Attending: Oncology

## 2017-12-25 VITALS — BP 129/70 | HR 77 | Temp 98.3°F | Resp 18 | Ht 63.0 in | Wt 202.1 lb

## 2017-12-25 DIAGNOSIS — C773 Secondary and unspecified malignant neoplasm of axilla and upper limb lymph nodes: Secondary | ICD-10-CM | POA: Diagnosis not present

## 2017-12-25 DIAGNOSIS — Z170 Estrogen receptor positive status [ER+]: Secondary | ICD-10-CM | POA: Insufficient documentation

## 2017-12-25 DIAGNOSIS — M8580 Other specified disorders of bone density and structure, unspecified site: Secondary | ICD-10-CM | POA: Diagnosis not present

## 2017-12-25 DIAGNOSIS — Z79899 Other long term (current) drug therapy: Secondary | ICD-10-CM | POA: Insufficient documentation

## 2017-12-25 DIAGNOSIS — C50411 Malignant neoplasm of upper-outer quadrant of right female breast: Secondary | ICD-10-CM | POA: Insufficient documentation

## 2017-12-25 DIAGNOSIS — C50911 Malignant neoplasm of unspecified site of right female breast: Secondary | ICD-10-CM

## 2017-12-25 DIAGNOSIS — Z01812 Encounter for preprocedural laboratory examination: Secondary | ICD-10-CM | POA: Insufficient documentation

## 2017-12-25 DIAGNOSIS — Z1501 Genetic susceptibility to malignant neoplasm of breast: Secondary | ICD-10-CM

## 2017-12-25 DIAGNOSIS — Z1509 Genetic susceptibility to other malignant neoplasm: Secondary | ICD-10-CM

## 2017-12-25 DIAGNOSIS — N951 Menopausal and female climacteric states: Secondary | ICD-10-CM

## 2017-12-25 HISTORY — DX: Other acariasis: B88.0

## 2017-12-25 HISTORY — DX: Personal history of other diseases of the musculoskeletal system and connective tissue: Z87.39

## 2017-12-25 HISTORY — DX: Radial styloid tenosynovitis (de quervain): M65.4

## 2017-12-25 HISTORY — DX: Unspecified injury of left ankle, initial encounter: S99.912A

## 2017-12-25 HISTORY — DX: Fibromyalgia: M79.7

## 2017-12-25 HISTORY — DX: Depression, unspecified: F32.A

## 2017-12-25 HISTORY — DX: Anxiety disorder, unspecified: F41.9

## 2017-12-25 HISTORY — DX: Obesity, unspecified: E66.9

## 2017-12-25 HISTORY — DX: Polyneuropathy, unspecified: G62.9

## 2017-12-25 HISTORY — DX: Hyperlipidemia, unspecified: E78.5

## 2017-12-25 HISTORY — DX: Major depressive disorder, single episode, unspecified: F32.9

## 2017-12-25 LAB — COMPREHENSIVE METABOLIC PANEL
ALK PHOS: 57 U/L (ref 38–126)
ALT: 17 U/L (ref 0–44)
ANION GAP: 8 (ref 5–15)
AST: 19 U/L (ref 15–41)
Albumin: 3.8 g/dL (ref 3.5–5.0)
BUN: 13 mg/dL (ref 6–20)
CALCIUM: 9.1 mg/dL (ref 8.9–10.3)
CO2: 25 mmol/L (ref 22–32)
Chloride: 105 mmol/L (ref 98–111)
Creatinine, Ser: 0.62 mg/dL (ref 0.44–1.00)
Glucose, Bld: 90 mg/dL (ref 70–99)
Potassium: 3.9 mmol/L (ref 3.5–5.1)
SODIUM: 138 mmol/L (ref 135–145)
Total Bilirubin: 1.1 mg/dL (ref 0.3–1.2)
Total Protein: 6.8 g/dL (ref 6.5–8.1)

## 2017-12-25 LAB — URINALYSIS, ROUTINE W REFLEX MICROSCOPIC
BILIRUBIN URINE: NEGATIVE
Glucose, UA: NEGATIVE mg/dL
Hgb urine dipstick: NEGATIVE
KETONES UR: NEGATIVE mg/dL
LEUKOCYTES UA: NEGATIVE
NITRITE: NEGATIVE
PH: 6 (ref 5.0–8.0)
Protein, ur: NEGATIVE mg/dL
Specific Gravity, Urine: 1.017 (ref 1.005–1.030)

## 2017-12-25 LAB — CBC
HCT: 40.3 % (ref 36.0–46.0)
HEMOGLOBIN: 13.5 g/dL (ref 12.0–15.0)
MCH: 30.7 pg (ref 26.0–34.0)
MCHC: 33.5 g/dL (ref 30.0–36.0)
MCV: 91.6 fL (ref 80.0–100.0)
Platelets: 175 10*3/uL (ref 150–400)
RBC: 4.4 MIL/uL (ref 3.87–5.11)
RDW: 13.1 % (ref 11.5–15.5)
WBC: 9.5 10*3/uL (ref 4.0–10.5)
nRBC: 0 % (ref 0.0–0.2)

## 2017-12-25 MED ORDER — TRAMADOL HCL 50 MG PO TABS: 50.0000 mg | ORAL_TABLET | Freq: Four times a day (QID) | ORAL | 0 refills | Status: DC | PRN

## 2017-12-25 NOTE — Pre-Procedure Instructions (Signed)
Sent a request for new consent order in epic via epic to Dr. Gerarda Fraction and Jerilynn Mages. Elinor Parkinson, NP.

## 2017-12-25 NOTE — Telephone Encounter (Signed)
Gave pt avs and calendar  °

## 2018-01-08 ENCOUNTER — Telehealth: Payer: Self-pay

## 2018-01-08 NOTE — Telephone Encounter (Addendum)
Incoming call from pt regarding she has a sinus infection and will start on antibiotics today and would like to know if she can still have surgery as scheduled. I let her know that I will ask Melissa NP and call her back. Pt voiced understanding.   Per Lenna Sciara NP- ok if pt wants to keep date (Wednesday) as long as she starts her antibiotic and is not feeling run down or feverish or simply wants to change date.    Outgoing call to patient.  Pt denies fever or feeling very bad but said she needs time to think about whether she wants to keep date or change.  Pt said she would like to know possible other dates available but doesn't want to cancel yet.  I told her I would let Melissa NP know and call her back tomorrow.  Voiced understanding and no other needs per pt at this time.

## 2018-01-09 ENCOUNTER — Telehealth: Payer: Self-pay

## 2018-01-09 NOTE — Telephone Encounter (Signed)
Outgoing call to patient to see if one of the other surgery dates will work for her better.  Pt said- yes she would feel better about switching dates since her sinuses are feeling so bad today.  Pt decided on Dec 18th for surgery date.  F/U appt made for Dec 30th at 10:15 am. Notified [name] NP.  No other needs per pt at this time.

## 2018-01-24 ENCOUNTER — Ambulatory Visit: Payer: BLUE CROSS/BLUE SHIELD | Admitting: Obstetrics

## 2018-01-29 ENCOUNTER — Other Ambulatory Visit: Payer: BLUE CROSS/BLUE SHIELD

## 2018-01-29 ENCOUNTER — Ambulatory Visit
Admission: RE | Admit: 2018-01-29 | Discharge: 2018-01-29 | Disposition: A | Discharge status: Home / Self Care | Payer: BLUE CROSS/BLUE SHIELD | Source: Ambulatory Visit | Attending: Oncology | Admitting: Oncology

## 2018-01-29 DIAGNOSIS — Z1231 Encounter for screening mammogram for malignant neoplasm of breast: Secondary | ICD-10-CM

## 2018-01-29 MED ORDER — GADOBUTROL 1 MMOL/ML IV SOLN
8.0000 mL | Freq: Once | INTRAVENOUS | Status: AC | PRN
  Administered 2018-01-29: 8 mL via INTRAVENOUS

## 2018-02-01 ENCOUNTER — Other Ambulatory Visit: Payer: Self-pay | Admitting: Oncology

## 2018-02-01 DIAGNOSIS — C50411 Malignant neoplasm of upper-outer quadrant of right female breast: Secondary | ICD-10-CM

## 2018-02-01 DIAGNOSIS — Z170 Estrogen receptor positive status [ER+]: Principal | ICD-10-CM

## 2018-02-01 NOTE — Progress Notes (Signed)
I called the patient let her know the results of her MRI and set her up for ultrasound of the left axilla and biopsy as needed.

## 2018-02-05 ENCOUNTER — Ambulatory Visit
Admission: RE | Admit: 2018-02-05 | Discharge: 2018-02-05 | Disposition: A | Discharge status: Home / Self Care | Payer: BLUE CROSS/BLUE SHIELD | Source: Ambulatory Visit | Attending: Oncology | Admitting: Oncology

## 2018-02-05 ENCOUNTER — Other Ambulatory Visit (HOSPITAL_COMMUNITY)
Admission: RE | Admit: 2018-02-05 | Discharge: 2018-02-05 | Disposition: A | Discharge status: Home / Self Care | Payer: BLUE CROSS/BLUE SHIELD | Source: Ambulatory Visit | Attending: Oncology | Admitting: Oncology

## 2018-02-05 DIAGNOSIS — Z170 Estrogen receptor positive status [ER+]: Principal | ICD-10-CM

## 2018-02-05 DIAGNOSIS — C50411 Malignant neoplasm of upper-outer quadrant of right female breast: Secondary | ICD-10-CM

## 2018-02-06 DIAGNOSIS — Z923 Personal history of irradiation: Secondary | ICD-10-CM | POA: Insufficient documentation

## 2018-02-07 ENCOUNTER — Other Ambulatory Visit: Payer: Self-pay | Admitting: General Surgery

## 2018-02-12 ENCOUNTER — Other Ambulatory Visit: Payer: Self-pay | Admitting: Oncology

## 2018-02-12 DIAGNOSIS — C50911 Malignant neoplasm of unspecified site of right female breast: Secondary | ICD-10-CM

## 2018-02-12 DIAGNOSIS — Z170 Estrogen receptor positive status [ER+]: Principal | ICD-10-CM

## 2018-02-12 DIAGNOSIS — Z1501 Genetic susceptibility to malignant neoplasm of breast: Secondary | ICD-10-CM

## 2018-02-12 DIAGNOSIS — D47Z9 Other specified neoplasms of uncertain behavior of lymphoid, hematopoietic and related tissue: Secondary | ICD-10-CM

## 2018-02-12 DIAGNOSIS — C50411 Malignant neoplasm of upper-outer quadrant of right female breast: Secondary | ICD-10-CM

## 2018-02-12 NOTE — Progress Notes (Signed)
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  I am setting her up for some scans and I have asked Dr. Barry Dienes to consider a larger biopsy for definitive diagnosis

## 2018-02-13 ENCOUNTER — Other Ambulatory Visit: Payer: Self-pay | Admitting: General Surgery

## 2018-02-13 DIAGNOSIS — R590 Localized enlarged lymph nodes: Secondary | ICD-10-CM

## 2018-02-14 ENCOUNTER — Telehealth: Payer: Self-pay | Admitting: Obstetrics

## 2018-02-14 NOTE — Telephone Encounter (Signed)
Patient stopped by for a print out of calendar and to speak with Sharyn Lull regarding changing surgery date.

## 2018-02-21 ENCOUNTER — Encounter (HOSPITAL_COMMUNITY): Payer: Self-pay

## 2018-02-21 ENCOUNTER — Ambulatory Visit (HOSPITAL_COMMUNITY)
Admission: RE | Admit: 2018-02-21 | Discharge: 2018-02-21 | Disposition: A | Discharge status: Home / Self Care | Payer: BLUE CROSS/BLUE SHIELD | Source: Ambulatory Visit | Attending: Oncology | Admitting: Oncology

## 2018-02-21 DIAGNOSIS — C50411 Malignant neoplasm of upper-outer quadrant of right female breast: Secondary | ICD-10-CM | POA: Diagnosis not present

## 2018-02-21 DIAGNOSIS — C50911 Malignant neoplasm of unspecified site of right female breast: Secondary | ICD-10-CM | POA: Diagnosis present

## 2018-02-21 DIAGNOSIS — Z1501 Genetic susceptibility to malignant neoplasm of breast: Secondary | ICD-10-CM | POA: Insufficient documentation

## 2018-02-21 DIAGNOSIS — Z170 Estrogen receptor positive status [ER+]: Secondary | ICD-10-CM | POA: Diagnosis present

## 2018-02-21 DIAGNOSIS — D47Z9 Other specified neoplasms of uncertain behavior of lymphoid, hematopoietic and related tissue: Secondary | ICD-10-CM

## 2018-02-21 MED ORDER — IOHEXOL 300 MG/ML  SOLN
100.0000 mL | Freq: Once | INTRAMUSCULAR | Status: AC | PRN
  Administered 2018-02-21: 100 mL via INTRAVENOUS

## 2018-02-21 MED ORDER — SODIUM CHLORIDE (PF) 0.9 % IJ SOLN
INTRAMUSCULAR | Status: AC
  Filled 2018-02-21: qty 50

## 2018-02-21 NOTE — Pre-Procedure Instructions (Signed)
[name]  02/21/2018      [id number] - [place], [place] [address] [place] [address] Phone: [phone] Fax: [phone]    Your procedure is scheduled on December 20th.  Report to [facility] Admitting at 0830 A.M.  Call this number if you have problems the morning of surgery:  [phone]   Remember:  Do not eat after midnight.  You may drink clear liquids until 0730am .  Clear liquids allowed are:                    Water, Juice (non-citric and without pulp), Carbonated beverages, Clear Tea, Black Coffee only and Gatorade    Take these medicines the morning of surgery with A SIP OF WATER   alendronate (FOSAMAX) anastrozole (ARIMIDEX) chlorpheniramine (CHLOR-TRIMETON) if needed citalopram (CELEXA) gabapentin (NEURONTIN)  levothyroxine (SYNTHROID, LEVOTHROID)    7 days prior to surgery STOP taking any meloxicam (MOBIC), Aspirin(unless otherwise instructed by your surgeon), Aleve, Naproxen, Ibuprofen, Motrin, Advil, Goody's, BC's, all herbal medications, fish oil, and all vitamins     Do not wear jewelry, make-up or nail polish.  Do not wear lotions, powders, or perfumes, or deodorant.  Do not shave 48 hours prior to surgery.  Men may shave face and neck.  Do not bring valuables to the hospital.  [facility] is not responsible for any belongings or valuables.  Contacts, dentures or bridgework may not be worn into surgery.  Leave your suitcase in the car.  After surgery it may be brought to your room.  For patients admitted to the hospital, discharge time will be determined by your treatment team.  Patients discharged the day of surgery will not be allowed to drive home.    [place]- Preparing For Surgery  Before surgery, you can play an important role. Because skin is not sterile, your skin needs to be as free of germs as possible. You can reduce the number of germs on your skin by washing with CHG  (chlorahexidine gluconate) Soap before surgery.  CHG is an antiseptic cleaner which kills germs and bonds with the skin to continue killing germs even after washing.    Oral Hygiene is also important to reduce your risk of infection.  Remember - BRUSH YOUR TEETH THE MORNING OF SURGERY WITH YOUR REGULAR TOOTHPASTE  Please do not use if you have an allergy to CHG or antibacterial soaps. If your skin becomes reddened/irritated stop using the CHG.  Do not shave (including legs and underarms) for at least 48 hours prior to first CHG shower. It is OK to shave your face.  Please follow these instructions carefully.   1. Shower the NIGHT BEFORE SURGERY and the MORNING OF SURGERY with CHG.   2. If you chose to wash your hair, wash your hair first as usual with your normal shampoo.  3. After you shampoo, rinse your hair and body thoroughly to remove the shampoo.  4. Use CHG as you would any other liquid soap. You can apply CHG directly to the skin and wash gently with a scrungie or a clean washcloth.   5. Apply the CHG Soap to your body ONLY FROM THE NECK DOWN.  Do not use on open wounds or open sores. Avoid contact with your eyes, ears, mouth and genitals (private parts). Wash Face and genitals (private parts)  with your normal soap.  6. Wash thoroughly, paying special attention to the area  where your surgery will be performed.  7. Thoroughly rinse your body with warm water from the neck down.  8. DO NOT shower/wash with your normal soap after using and rinsing off the CHG Soap.  9. Pat yourself dry with a CLEAN TOWEL.  10. Wear CLEAN PAJAMAS to bed the night before surgery, wear comfortable clothes the morning of surgery  11. Place CLEAN SHEETS on your bed the night of your first shower and DO NOT SLEEP WITH PETS.    Day of Surgery:  Do not apply any deodorants/lotions.  Please wear clean clothes to the hospital/surgery center.   Remember to brush your teeth WITH YOUR REGULAR  TOOTHPASTE.    Please read over the following fact sheets that you were given.

## 2018-02-22 ENCOUNTER — Encounter (HOSPITAL_COMMUNITY): Payer: Self-pay

## 2018-02-22 ENCOUNTER — Other Ambulatory Visit: Payer: Self-pay

## 2018-02-22 ENCOUNTER — Encounter (HOSPITAL_COMMUNITY)
Admission: RE | Admit: 2018-02-22 | Discharge: 2018-02-22 | Disposition: A | Discharge status: Home / Self Care | Payer: BLUE CROSS/BLUE SHIELD | Source: Ambulatory Visit | Attending: General Surgery | Admitting: General Surgery

## 2018-02-22 DIAGNOSIS — Z01812 Encounter for preprocedural laboratory examination: Secondary | ICD-10-CM | POA: Diagnosis not present

## 2018-02-22 HISTORY — DX: Unspecified staphylococcus as the cause of diseases classified elsewhere: B95.8

## 2018-02-22 LAB — CBC
HCT: 42.6 % (ref 36.0–46.0)
Hemoglobin: 14.1 g/dL (ref 12.0–15.0)
MCH: 30.3 pg (ref 26.0–34.0)
MCHC: 33.1 g/dL (ref 30.0–36.0)
MCV: 91.6 fL (ref 80.0–100.0)
Platelets: 193 10*3/uL (ref 150–400)
RBC: 4.65 MIL/uL (ref 3.87–5.11)
RDW: 12.8 % (ref 11.5–15.5)
WBC: 10.7 10*3/uL — ABNORMAL HIGH (ref 4.0–10.5)
nRBC: 0 % (ref 0.0–0.2)

## 2018-02-22 LAB — BASIC METABOLIC PANEL
Anion gap: 10 (ref 5–15)
BUN: 10 mg/dL (ref 6–20)
CO2: 25 mmol/L (ref 22–32)
Calcium: 9.5 mg/dL (ref 8.9–10.3)
Chloride: 105 mmol/L (ref 98–111)
Creatinine, Ser: 0.82 mg/dL (ref 0.44–1.00)
GFR calc Af Amer: >60 mL/min (ref >60)
GFR calc non Af Amer: >60 mL/min (ref >60)
Glucose, Bld: 78 mg/dL (ref 70–99)
POTASSIUM: 3.7 mmol/L (ref 3.5–5.1)
SODIUM: 140 mmol/L (ref 135–145)

## 2018-02-22 LAB — SURGICAL PCR SCREEN
MRSA, PCR: NEGATIVE
Staphylococcus aureus: POSITIVE — AB

## 2018-02-22 NOTE — Progress Notes (Signed)
PCP - L[facility] Coe Cordial in [facility] Cardiologist - denies  Pt states she had a staph infection a few years ago that may have been MRSA. Nasal PCR performed d/t possible hx of MRSA  Patient denies shortness of breath, fever, cough and chest pain at PAT appointment   Patient verbalized understanding of instructions that were given to them at the PAT appointment. Patient was also instructed that they will need to review over the PAT instructions again at home before surgery.

## 2018-02-27 NOTE — H&P (Signed)
[name] Documented: 02/23/2018 10:07 AM Location: [facility] Patient #: [id number] DOB: 1960/06/27 Married / Language: English / Race: White Female   History of Present Illness [name] MD; 02/23/2018 10:53 AM) The patient is a 57 year old female who presents for a follow-up for Breast cancer. Pt is a 57 yo F who I took care of for right breast cancer in March 2012. She was referred for genetics, but put off her appointment because of timing. She bresented with a palpable mass. She had right lumpectomy with ALND 06/2010. She had a pT1cN1M0 right breast cancer, grade 3 invasive ductal, +/+/-. She received adjuvant chemotherapy and XRT. She started letrozole Jan 2013 and then switched to tamoxifen 2016 to complete 5 years of antiestrogen tx.   She had an additional relative get breast cancer at a younger age and was found to be BRCA 2 positive. She was referred for consultation by Dr Jana Hakim with regard to this new diagnosis. She has seed Dr. Jana Hakim and has restarted antiestrogen tx wtih anastrozole April 2019. She has seen gyn onc (Dr. Gerarda Fraction) and has discussed BSO +/- hysterectomy.   I saw her several months ago and we had long talks about pros and cons of bilateral mastectomies. She wanted to start wtih aggressive screening and at least BSO. However, breast MRI showed prominent left axillary lymph node and biopsy was probable lymphoma, but extra tissue was needed. She is set up for seed targeted LN biopsy next week. Staging CTs are negative.   CT chest/abd/pelvis 12/11/20194 IMPRESSION: Left axillary lymphadenopathy, measuring up to 15 mm short axis. Additional small thoracic, retroperitoneal, and bilateral inguinal lymph nodes, within normal limits for size, although possibly related to the patient's known lymphoma. Spleen is normal in size.  Status post right breast lumpectomy and right axillary lymph node dissection. No findings specific for  recurrent or metastatic disease.  MR breast bilat 01/29/2018  IMPRESSION: Enlarged left axillary adenopathy.  RECOMMENDATION: Ultrasound of the left axilla is recommended. If enlarged adenopathy is identified with ultrasound biopsy would be suggested.  BI-RADS CATEGORY 4: Suspicious.  pathology 02/05/2018 Diagnosis Lymph node, needle/core biopsy, left axilla - ATYPICAL LYMPHOID PROLIFERATION. - SEE COMMENT. Microscopic Comment The sections show multiple needle core biopsy fragments of lymph nodal tissue displaying relative preservation of the architecture with open sinuses. However, there are numerous variably sized and mostly ill defined atypical appearing lymphoid follicles characterized by lack or attenuated mantle zones, lack of polarity and a homogenous composition of small angulated lymphoid cells admixed with a relatively minor population of larger lymphoid cells. No metastatic malignancy is identified. Flow cytometric analysis was performed ([id number]) and shows predominance of B cells expressing pan B cell antigens associated with partial expression of CD10 and kappa light chain excess. Immunohistochemical stains were performed and show that the atypical lymphoid follicles are positive for CD20, CD79a, CD10, and BCL-2. CD3 and CD5 highlight the T cell component, which is primarily seen in the interfollicular zones. No significant cyclin-D1 or cytokeratin (AE1/AE3) positivity identified. The overall findings are highly suspicious for involvement by a B cell lymphoproliferative process particularly follicular lymphoma. Excisional biopsy is strongly recommended for further evaluation.   Allergies (April Staton, CMA; 02/23/2018 10:08 AM) No Known Drug Allergies [12/18/2017]:  Medication History (April Staton, CMA; 02/23/2018 10:08 AM) Alendronate Sodium (35MG Tablet, Oral) Active. Anastrozole (1MG Tablet, Oral) Active. Citalopram Hydrobromide (40MG Tablet, Oral)  Active. Gabapentin (300MG Capsule, Oral) Active. hydrOXYzine HCl (25MG Tablet, Oral) Active. Levothyroxine Sodium (88MCG Tablet,  Oral) Active. Meloxicam (7.5MG Tablet, Oral) Active. Pravastatin Sodium (40MG Tablet, Oral) Active. Medications Reconciled    Review of Systems [name] MD; 02/23/2018 10:53 AM) All other systems negative  Vitals (April Staton [place]; 02/23/2018 10:09 AM) 02/23/2018 10:08 AM Weight: 201.25 lb Height: 63in Body Surface Area: 1.94 m Body Mass Index: 35.65 kg/m  Temp.: 97.64F(Oral)  Pulse: 73 (Regular)  BP: 146/86 (Sitting, Left Arm, Standard)       Physical Exam [name] MD; 02/23/2018 10:53 AM) General Mental Status-Alert. General Appearance-Consistent with stated age. Hydration-Well hydrated. Voice-Normal.  Head and Neck Head-normocephalic, atraumatic with no lesions or palpable masses.  Eye Sclera/Conjunctiva - Bilateral-No scleral icterus.  Chest and Lung Exam Chest and lung exam reveals -quiet, even and easy respiratory effort with no use of accessory muscles. Inspection Chest Wall - Normal. Back - normal.  Breast Note: no palpable nodules or LAD.   Cardiovascular Cardiovascular examination reveals -normal pedal pulses bilaterally. Note: regular rate and rhythm  Abdomen Inspection-Inspection Normal. Palpation/Percussion Palpation and Percussion of the abdomen reveal - Soft, Non Tender, No Rebound tenderness, No Rigidity (guarding) and No hepatosplenomegaly.  Peripheral Vascular Upper Extremity Inspection - Bilateral - Normal - No Clubbing, No Cyanosis, No Edema, Pulses Intact. Lower Extremity Palpation - Edema - Bilateral - No edema.  Neurologic Neurologic evaluation reveals -alert and oriented x 3 with no impairment of recent or remote memory. Mental Status-Normal.  Musculoskeletal Global Assessment -Note: no gross deformities.  Normal Exam - Left-Upper Extremity  Strength Normal and Lower Extremity Strength Normal. Normal Exam - Right-Upper Extremity Strength Normal and Lower Extremity Strength Normal.  Lymphatic Head & Neck  General Head & Neck Lymphatics: Bilateral - Description - Normal. Axillary  General Axillary Region: Bilateral - Description - Normal. Tenderness - Non Tender.    Assessment & Plan [name] MD; 02/23/2018 10:55 AM) AXILLARY LYMPHADENOPATHY (R59.0) Impression: seed targeted node scheduled for next week. Discussed risks including bleeding, infection, damage to adjacent structures, numbness, etc. Current Plans Instructed to keep follow-up appointment as scheduled BRCA2 POSITIVE (Z15.01) Impression: Mastectomy discussion on backtable while we sort out lymphoma.  BSO to get rescheduled after we determine if she needs chemo or not.    Signed by [name], MD (02/23/2018 10:55 AM)

## 2018-02-28 ENCOUNTER — Ambulatory Visit (HOSPITAL_BASED_OUTPATIENT_CLINIC_OR_DEPARTMENT_OTHER): Admission: RE | Admit: 2018-02-28 | Payer: BLUE CROSS/BLUE SHIELD | Source: Ambulatory Visit | Admitting: Obstetrics

## 2018-02-28 ENCOUNTER — Other Ambulatory Visit: Payer: Self-pay | Admitting: Oncology

## 2018-02-28 ENCOUNTER — Encounter (HOSPITAL_BASED_OUTPATIENT_CLINIC_OR_DEPARTMENT_OTHER): Admission: RE | Payer: Self-pay | Source: Ambulatory Visit

## 2018-02-28 ENCOUNTER — Ambulatory Visit
Admission: RE | Admit: 2018-02-28 | Discharge: 2018-02-28 | Disposition: A | Discharge status: Home / Self Care | Payer: BLUE CROSS/BLUE SHIELD | Source: Ambulatory Visit | Attending: General Surgery | Admitting: General Surgery

## 2018-02-28 ENCOUNTER — Other Ambulatory Visit: Payer: Self-pay | Admitting: General Surgery

## 2018-02-28 DIAGNOSIS — R590 Localized enlarged lymph nodes: Secondary | ICD-10-CM

## 2018-02-28 SURGERY — SALPINGO-OOPHORECTOMY, BILATERAL, ROBOT-ASSISTED
Anesthesia: General

## 2018-03-02 ENCOUNTER — Ambulatory Visit (HOSPITAL_COMMUNITY)
Admission: RE | Admit: 2018-03-02 | Discharge: 2018-03-02 | Disposition: A | Discharge status: Home / Self Care | Payer: BLUE CROSS/BLUE SHIELD | Attending: General Surgery | Admitting: General Surgery

## 2018-03-02 ENCOUNTER — Ambulatory Visit (HOSPITAL_COMMUNITY): Payer: BLUE CROSS/BLUE SHIELD | Admitting: Anesthesiology

## 2018-03-02 ENCOUNTER — Ambulatory Visit
Admission: RE | Admit: 2018-03-02 | Discharge: 2018-03-02 | Disposition: A | Discharge status: Home / Self Care | Payer: BLUE CROSS/BLUE SHIELD | Source: Ambulatory Visit | Attending: General Surgery | Admitting: General Surgery

## 2018-03-02 ENCOUNTER — Encounter (HOSPITAL_COMMUNITY): Payer: Self-pay | Admitting: Anesthesiology

## 2018-03-02 ENCOUNTER — Encounter (HOSPITAL_COMMUNITY)
Admission: RE | Disposition: A | Discharge status: Home / Self Care | Payer: Self-pay | Source: Home / Self Care | Attending: General Surgery

## 2018-03-02 DIAGNOSIS — Z1501 Genetic susceptibility to malignant neoplasm of breast: Secondary | ICD-10-CM | POA: Diagnosis present

## 2018-03-02 DIAGNOSIS — C8234 Follicular lymphoma grade IIIa, lymph nodes of axilla and upper limb: Secondary | ICD-10-CM | POA: Diagnosis not present

## 2018-03-02 DIAGNOSIS — R590 Localized enlarged lymph nodes: Secondary | ICD-10-CM

## 2018-03-02 HISTORY — PX: RADIOACTIVE SEED GUIDED AXILLARY SENTINEL LYMPH NODE: SHX6735

## 2018-03-02 SURGERY — RADIOACTIVE SEED GUIDED AXILLARY SENTINEL LYMPH NODE BIOPSY
Anesthesia: General | Site: Axilla | Laterality: Left

## 2018-03-02 MED ORDER — LACTATED RINGERS IV SOLN
INTRAVENOUS | Status: DC
  Administered 2018-03-02: 09:00:00 via INTRAVENOUS

## 2018-03-02 MED ORDER — FENTANYL CITRATE (PF) 250 MCG/5ML IJ SOLN
INTRAMUSCULAR | Status: AC
  Filled 2018-03-02: qty 5

## 2018-03-02 MED ORDER — EPHEDRINE SULFATE 50 MG/ML IJ SOLN
INTRAMUSCULAR | Status: DC | PRN
  Administered 2018-03-02: 10 mg via INTRAVENOUS

## 2018-03-02 MED ORDER — LIDOCAINE HCL 1 % IJ SOLN
INTRAMUSCULAR | Status: AC
  Filled 2018-03-02: qty 20

## 2018-03-02 MED ORDER — METHYLENE BLUE 0.5 % INJ SOLN
INTRAVENOUS | Status: AC
  Filled 2018-03-02: qty 10

## 2018-03-02 MED ORDER — ONDANSETRON HCL 4 MG/2ML IJ SOLN: 4.0000 mg | Freq: Once | INTRAMUSCULAR | Status: DC | PRN

## 2018-03-02 MED ORDER — MIDAZOLAM HCL 2 MG/2ML IJ SOLN
INTRAMUSCULAR | Status: DC | PRN
  Administered 2018-03-02: 2 mg via INTRAVENOUS

## 2018-03-02 MED ORDER — CHLORHEXIDINE GLUCONATE CLOTH 2 % EX PADS: 6.0000 | MEDICATED_PAD | Freq: Once | CUTANEOUS | Status: DC

## 2018-03-02 MED ORDER — OXYCODONE HCL 5 MG PO TABS: 5.0000 mg | ORAL_TABLET | Freq: Four times a day (QID) | ORAL | 0 refills | Status: DC | PRN

## 2018-03-02 MED ORDER — OXYCODONE HCL 5 MG PO TABS
5.0000 mg | ORAL_TABLET | Freq: Once | ORAL | Status: AC
  Administered 2018-03-02: 5 mg via ORAL

## 2018-03-02 MED ORDER — BUPIVACAINE-EPINEPHRINE (PF) 0.25% -1:200000 IJ SOLN
INTRAMUSCULAR | Status: AC
  Filled 2018-03-02: qty 30

## 2018-03-02 MED ORDER — BUPIVACAINE-EPINEPHRINE (PF) 0.5% -1:200000 IJ SOLN
INTRAMUSCULAR | Status: DC | PRN
  Administered 2018-03-02: 25 mL

## 2018-03-02 MED ORDER — MIDAZOLAM HCL 2 MG/2ML IJ SOLN
INTRAMUSCULAR | Status: AC
  Filled 2018-03-02: qty 2

## 2018-03-02 MED ORDER — LIDOCAINE 2% (20 MG/ML) 5 ML SYRINGE
INTRAMUSCULAR | Status: DC | PRN
  Administered 2018-03-02: 60 mg via INTRAVENOUS

## 2018-03-02 MED ORDER — MIDAZOLAM HCL 2 MG/2ML IJ SOLN
INTRAMUSCULAR | Status: AC
  Administered 2018-03-02: 1 mg
  Filled 2018-03-02: qty 2

## 2018-03-02 MED ORDER — ONDANSETRON HCL 4 MG/2ML IJ SOLN
INTRAMUSCULAR | Status: DC | PRN
  Administered 2018-03-02: 4 mg via INTRAVENOUS

## 2018-03-02 MED ORDER — 0.9 % SODIUM CHLORIDE (POUR BTL) OPTIME
TOPICAL | Status: DC | PRN
  Administered 2018-03-02: 1000 mL

## 2018-03-02 MED ORDER — OXYCODONE HCL 5 MG PO TABS
ORAL_TABLET | ORAL | Status: AC
  Filled 2018-03-02: qty 1

## 2018-03-02 MED ORDER — FENTANYL CITRATE (PF) 100 MCG/2ML IJ SOLN: 25.0000 ug | INTRAMUSCULAR | Status: DC | PRN

## 2018-03-02 MED ORDER — ACETAMINOPHEN 500 MG PO TABS
1000.0000 mg | ORAL_TABLET | ORAL | Status: AC
  Administered 2018-03-02: 1000 mg via ORAL
  Filled 2018-03-02: qty 2

## 2018-03-02 MED ORDER — CEFAZOLIN SODIUM-DEXTROSE 2-4 GM/100ML-% IV SOLN
2.0000 g | INTRAVENOUS | Status: AC
  Administered 2018-03-02: 2 g via INTRAVENOUS
  Filled 2018-03-02: qty 100

## 2018-03-02 MED ORDER — GABAPENTIN 300 MG PO CAPS
300.0000 mg | ORAL_CAPSULE | ORAL | Status: AC
  Administered 2018-03-02: 300 mg via ORAL
  Filled 2018-03-02: qty 1

## 2018-03-02 MED ORDER — FENTANYL CITRATE (PF) 250 MCG/5ML IJ SOLN
INTRAMUSCULAR | Status: DC | PRN
  Administered 2018-03-02: 50 ug via INTRAVENOUS

## 2018-03-02 MED ORDER — LIDOCAINE HCL 1 % IJ SOLN
INTRAMUSCULAR | Status: DC | PRN
  Administered 2018-03-02: 20 mL

## 2018-03-02 MED ORDER — PROPOFOL 10 MG/ML IV BOLUS
INTRAVENOUS | Status: DC | PRN
  Administered 2018-03-02: 200 mg via INTRAVENOUS

## 2018-03-02 MED ORDER — PROPOFOL 10 MG/ML IV BOLUS
INTRAVENOUS | Status: AC
  Filled 2018-03-02: qty 20

## 2018-03-02 MED ORDER — FENTANYL CITRATE (PF) 100 MCG/2ML IJ SOLN
INTRAMUSCULAR | Status: AC
  Administered 2018-03-02: 50 ug
  Filled 2018-03-02: qty 2

## 2018-03-02 MED ORDER — DEXAMETHASONE SODIUM PHOSPHATE 10 MG/ML IJ SOLN
INTRAMUSCULAR | Status: DC | PRN
  Administered 2018-03-02: 8 mg via INTRAVENOUS

## 2018-03-02 SURGICAL SUPPLY — 50 items
BINDER BREAST LRG (GAUZE/BANDAGES/DRESSINGS) IMPLANT
BINDER BREAST XLRG (GAUZE/BANDAGES/DRESSINGS) IMPLANT
BNDG COHESIVE 4X5 TAN STRL (GAUZE/BANDAGES/DRESSINGS) ×3 IMPLANT
CANISTER SUCT 3000ML PPV (MISCELLANEOUS) ×3 IMPLANT
CHLORAPREP W/TINT 26ML (MISCELLANEOUS) ×3 IMPLANT
CLIP VESOCCLUDE LG 6/CT (CLIP) ×3 IMPLANT
CLIP VESOCCLUDE MED 24/CT (CLIP) ×3 IMPLANT
CLIP VESOCCLUDE MED 6/CT (CLIP) ×6 IMPLANT
CLIP VESOCCLUDE SM WIDE 6/CT (CLIP) ×3 IMPLANT
CONT SPEC 4OZ CLIKSEAL STRL BL (MISCELLANEOUS) IMPLANT
COVER PROBE W GEL 5X96 (DRAPES) ×3 IMPLANT
COVER SURGICAL LIGHT HANDLE (MISCELLANEOUS) ×3 IMPLANT
COVER WAND RF STERILE (DRAPES) ×3 IMPLANT
DERMABOND ADVANCED (GAUZE/BANDAGES/DRESSINGS) ×2
DERMABOND ADVANCED .7 DNX12 (GAUZE/BANDAGES/DRESSINGS) ×1 IMPLANT
DEVICE DUBIN SPECIMEN MAMMOGRA (MISCELLANEOUS) IMPLANT
DRAPE CHEST BREAST 15X10 FENES (DRAPES) ×3 IMPLANT
DRAPE UNIVERSAL PACK (DRAPES) ×3 IMPLANT
DRAPE UTILITY XL STRL (DRAPES) ×6 IMPLANT
ELECT COATED BLADE 2.86 ST (ELECTRODE) ×3 IMPLANT
ELECT NEEDLE BLADE 2-5/6 (NEEDLE) ×3 IMPLANT
ELECT REM PT RETURN 9FT ADLT (ELECTROSURGICAL) ×3
ELECTRODE REM PT RTRN 9FT ADLT (ELECTROSURGICAL) ×1 IMPLANT
GLOVE BIO SURGEON STRL SZ 6 (GLOVE) ×3 IMPLANT
GLOVE INDICATOR 6.5 STRL GRN (GLOVE) ×3 IMPLANT
GOWN STRL REUS W/ TWL LRG LVL3 (GOWN DISPOSABLE) ×1 IMPLANT
GOWN STRL REUS W/TWL 2XL LVL3 (GOWN DISPOSABLE) ×3 IMPLANT
GOWN STRL REUS W/TWL LRG LVL3 (GOWN DISPOSABLE) ×2
KIT BASIN OR (CUSTOM PROCEDURE TRAY) ×3 IMPLANT
KIT MARKER MARGIN INK (KITS) ×3 IMPLANT
LIGHT WAVEGUIDE WIDE FLAT (MISCELLANEOUS) IMPLANT
NDL SAFETY ECLIPSE 18X1.5 (NEEDLE) IMPLANT
NEEDLE FILTER BLUNT 18X 1/2SAF (NEEDLE)
NEEDLE FILTER BLUNT 18X1 1/2 (NEEDLE) IMPLANT
NEEDLE HYPO 18GX1.5 SHARP (NEEDLE)
NEEDLE HYPO 25GX1X1/2 BEV (NEEDLE) ×3 IMPLANT
NS IRRIG 1000ML POUR BTL (IV SOLUTION) ×3 IMPLANT
PACK SURGICAL SETUP 50X90 (CUSTOM PROCEDURE TRAY) ×3 IMPLANT
PENCIL BUTTON HOLSTER BLD 10FT (ELECTRODE) ×3 IMPLANT
SPONGE LAP 18X18 X RAY DECT (DISPOSABLE) ×3 IMPLANT
STOCKINETTE IMPERVIOUS 9X36 MD (GAUZE/BANDAGES/DRESSINGS) ×3 IMPLANT
SUT MNCRL AB 4-0 PS2 18 (SUTURE) ×3 IMPLANT
SUT VIC AB 3-0 SH 8-18 (SUTURE) ×3 IMPLANT
SYR BULB 3OZ (MISCELLANEOUS) ×3 IMPLANT
SYR CONTROL 10ML LL (SYRINGE) ×3 IMPLANT
TOWEL OR 17X24 6PK STRL BLUE (TOWEL DISPOSABLE) ×3 IMPLANT
TOWEL OR 17X26 10 PK STRL BLUE (TOWEL DISPOSABLE) ×3 IMPLANT
TUBE CONNECTING 12'X1/4 (SUCTIONS) ×1
TUBE CONNECTING 12X1/4 (SUCTIONS) ×2 IMPLANT
YANKAUER SUCT BULB TIP NO VENT (SUCTIONS) ×3 IMPLANT

## 2018-03-02 NOTE — Anesthesia Postprocedure Evaluation (Signed)
Anesthesia Post Note  Patient: [name]  Procedure(s) Performed: SEED TARGETED DEEP LEFT AXILLARY LYMPH NODE BIOPSY (Left Axilla)     Patient location during evaluation: PACU Anesthesia Type: General Level of consciousness: awake and alert Pain management: pain level controlled Vital Signs Assessment: post-procedure vital signs reviewed and stable Respiratory status: spontaneous breathing, nonlabored ventilation, respiratory function stable and patient connected to nasal cannula oxygen Cardiovascular status: blood pressure returned to baseline and stable Postop Assessment: no apparent nausea or vomiting Anesthetic complications: no    Last Vitals:  Vitals:   03/02/18 1253 03/02/18 1259  BP: (!) 144/88 (!) 151/88  Pulse: 89 93  Resp: 12 15  Temp:    SpO2: 93% 100%    Last Pain:  Vitals:   03/02/18 1317  TempSrc:   PainSc: 3                  Tiajuana Amass

## 2018-03-02 NOTE — Anesthesia Procedure Notes (Signed)
Anesthesia Regional Block: Pectoralis block   Pre-Anesthetic Checklist: ,, timeout performed, Correct Patient, Correct Site, Correct Laterality, Correct Procedure, Correct Position, site marked, Risks and benefits discussed,  Surgical consent,  Pre-op evaluation,  At surgeon's request and post-op pain management  Laterality: Right  Prep: chloraprep       Needles:  Injection technique: Single-shot  Needle Type: Echogenic Needle     Needle Length: 9cm  Needle Gauge: 21     Additional Needles:   Procedures:,,,, ultrasound used (permanent image in chart),,,,  Narrative:  Start time: 03/02/2018 9:43 AM End time: 03/02/2018 9:50 AM Injection made incrementally with aspirations every 5 mL.  Performed by: Personally  Anesthesiologist: Suzette Battiest, MD

## 2018-03-02 NOTE — Interval H&P Note (Signed)
History and Physical Interval Note:  03/02/2018 8:59 AM  [name]  has presented today for surgery, with the diagnosis of LEFT ABNORMAL AXILLARY LYMPH NODE  The various methods of treatment have been discussed with the patient and family. After consideration of risks, benefits and other options for treatment, the patient has consented to  Procedure(s): [place] (Left) as a surgical intervention .  The patient's history has been reviewed, patient examined, no change in status, stable for surgery.  I have reviewed the patient's chart and labs.  Questions were answered to the patient's satisfaction.     [name]

## 2018-03-02 NOTE — Anesthesia Procedure Notes (Signed)
Procedure Name: LMA Insertion Date/Time: 03/02/2018 11:29 AM Performed by: Marsa Aris, CRNA Pre-anesthesia Checklist: Patient identified, Emergency Drugs available, Suction available and Patient being monitored Patient Re-evaluated:Patient Re-evaluated prior to induction Oxygen Delivery Method: Circle System Utilized Preoxygenation: Pre-oxygenation with 100% oxygen Induction Type: IV induction Ventilation: Mask ventilation without difficulty LMA: LMA inserted LMA Size: 4.0 Number of attempts: 1 Airway Equipment and Method: Bite block Placement Confirmation: positive ETCO2 Tube secured with: Tape Dental Injury: Teeth and Oropharynx as per pre-operative assessment

## 2018-03-02 NOTE — Anesthesia Preprocedure Evaluation (Signed)
Anesthesia Evaluation  Patient identified by MRN, date of birth, ID band Patient awake    Reviewed: Allergy & Precautions, NPO status , Patient's Chart, lab work & pertinent test results  Airway Mallampati: II  TM Distance: >3 FB Neck ROM: Full    Dental  (+) Dental Advisory Given   Pulmonary neg pulmonary ROS,    breath sounds clear to auscultation       Cardiovascular negative cardio ROS   Rhythm:Regular Rate:Normal     Neuro/Psych  Neuromuscular disease    GI/Hepatic negative GI ROS, Neg liver ROS,   Endo/Other  Hypothyroidism   Renal/GU negative Renal ROS     Musculoskeletal  (+) Arthritis , Fibromyalgia -  Abdominal   Peds  Hematology negative hematology ROS (+)   Anesthesia Other Findings   Reproductive/Obstetrics                             Lab Results  Component Value Date   WBC 10.7 (H) 02/22/2018   HGB 14.1 02/22/2018   HCT 42.6 02/22/2018   MCV 91.6 02/22/2018   PLT 193 02/22/2018   Lab Results  Component Value Date   CREATININE 0.82 02/22/2018   BUN 10 02/22/2018   NA 140 02/22/2018   K 3.7 02/22/2018   CL 105 02/22/2018   CO2 25 02/22/2018    Anesthesia Physical Anesthesia Plan  ASA: II  Anesthesia Plan: General   Post-op Pain Management:  Regional for Post-op pain   Induction: Intravenous  PONV Risk Score and Plan: 3 and Dexamethasone, Ondansetron and Treatment may vary due to age or medical condition  Airway Management Planned: LMA  Additional Equipment:   Intra-op Plan:   Post-operative Plan: Extubation in OR  Informed Consent: I have reviewed the patients History and Physical, chart, labs and discussed the procedure including the risks, benefits and alternatives for the proposed anesthesia with the patient or authorized representative who has indicated his/her understanding and acceptance.   Dental advisory given  Plan Discussed with:  CRNA  Anesthesia Plan Comments:         Anesthesia Quick Evaluation

## 2018-03-02 NOTE — Discharge Instructions (Addendum)
[facility] °Office Phone Number [phone] ° ° POST OP INSTRUCTIONS ° °Always review your discharge instruction sheet given to you by the facility where your surgery was performed. ° °IF YOU HAVE DISABILITY OR FAMILY LEAVE FORMS, YOU MUST BRING THEM TO THE OFFICE FOR PROCESSING.  DO NOT GIVE THEM TO YOUR DOCTOR. ° °1. A prescription for pain medication may be given to you upon discharge.  Take your pain medication as prescribed, if needed.  If narcotic pain medicine is not needed, then you may take acetaminophen (Tylenol) or ibuprofen (Advil) as needed. °2. Take your usually prescribed medications unless otherwise directed °3. If you need a refill on your pain medication, please contact your pharmacy.  They will contact our office to request authorization.  Prescriptions will not be filled after 5pm or on week-ends. °4. You should eat very light the first 24 hours after surgery, such as soup, crackers, pudding, etc.  Resume your normal diet the day after surgery °5. It is common to experience some constipation if taking pain medication after surgery.  Increasing fluid intake and taking a stool softener will usually help or prevent this problem from occurring.  A mild laxative (Milk of Magnesia or Miralax) should be taken according to package directions if there are no bowel movements after 48 hours. °6. You may shower in 48 hours.  The surgical glue will flake off in 2-3 weeks.   °7. ACTIVITIES:  No strenuous activity or heavy lifting for 1 week.   °a. You may drive when you no longer are taking prescription pain medication, you can comfortably wear a seatbelt, and you can safely maneuver your car and apply brakes. °b. RETURN TO WORK:  __________1 week_______________ °You should see your doctor in the office for a follow-up appointment approximately three-four weeks after your surgery.   ° °WHEN TO CALL YOUR DOCTOR: °1. Fever over 101.0 °2. Nausea and/or vomiting. °3. Extreme swelling or  bruising. °4. Continued bleeding from incision. °5. Increased pain, redness, or drainage from the incision. ° °The clinic staff is available to answer your questions during regular business hours.  Please don’t hesitate to call and ask to speak to one of the nurses for clinical concerns.  If you have a medical emergency, go to the nearest emergency room or call 911.  A surgeon from [facility] is always on call at the hospital. ° °For further questions, please visit [email or web address]  ° °

## 2018-03-02 NOTE — Op Note (Signed)
Pre op diagnosis:  Left axillary lymphadenopathy, core needle biopsy suspicious for lymphoma  Post op diagnosis:  Same  Procedure performed:  Left seed localized axillary lymph node biopsy  Surgeon:  [name], MD  Anesthesia:  General and local  EBL:  Minimal  Specimen:  L axillary lymph node for lymphoma workup  Procedure:   Patient was identified in the holding area and taken to the operating room and placed supine on the operating room table.  General anesthesia was induced with LMA.  The patient's axilla and L chest was clipped, prepped and draped in sterile fashion.  Time out was performed according to the surgical safety check list.  When all was correct, we continued.    The point of maximum intensity was identified with the neoprobe.  This was infiltrated with local anesthetic.  The skin was incised with a #10 blade.  The subcutaneous tissues were divided with the cautery.  A Wietlaner retractor was used to assist with visualization.  The clavipectoral fascia was opened.    A very large lymph node was immediately apparent.  This was elevated with an Allis clamp.  Hemaclips were used to ligate the lymphovascular channels entering the node from all sides.   Once this was complete, the node was passed off for lymphoma workup.    The cavity was irrigated copiously.  Hemostasis was achieved with the cautery.  The wound was closed with interrupted 3-0 Vicryl deep dermal sutures and 4-0 Monocryl running subcuticular suture.  The wound was cleaned, dried and dressed with Dermabond.    The patient was awakened from anesthesia and taken to the PACU in stable condition.  Needle, sponge, and instrument counts were correct.

## 2018-03-02 NOTE — Transfer of Care (Signed)
Immediate Anesthesia Transfer of Care Note  Patient: [name]  Procedure(s) Performed: SEED TARGETED DEEP LEFT AXILLARY LYMPH NODE BIOPSY (Left Axilla)  Patient Location: PACU  Anesthesia Type:GA combined with regional for post-op pain  Level of Consciousness: awake, alert  and oriented  Airway & Oxygen Therapy: Patient Spontanous Breathing  Post-op Assessment: Report given to RN and Post -op Vital signs reviewed and stable  Post vital signs: Reviewed and stable  Last Vitals:  Vitals Value Taken Time  BP 143/89 03/02/2018 12:40 PM  Temp    Pulse 94 03/02/2018 12:40 PM  Resp 12 03/02/2018 12:40 PM  SpO2 97 % 03/02/2018 12:40 PM  Vitals shown include unvalidated device data.  Last Pain:  Vitals:   03/02/18 0858  TempSrc: Oral         Complications: No apparent anesthesia complications

## 2018-03-03 ENCOUNTER — Encounter (HOSPITAL_COMMUNITY): Payer: Self-pay | Admitting: General Surgery

## 2018-03-09 ENCOUNTER — Telehealth: Payer: Self-pay | Admitting: *Deleted

## 2018-03-09 NOTE — Telephone Encounter (Signed)
Called the patient regarding her appt for Monday. Patient canceled appt due surgery on 12/20 and will call back when she is ready to schedule appt

## 2018-03-12 ENCOUNTER — Ambulatory Visit: Payer: BLUE CROSS/BLUE SHIELD | Admitting: Obstetrics

## 2018-03-13 ENCOUNTER — Other Ambulatory Visit: Payer: Self-pay | Admitting: Oncology

## 2018-03-13 DIAGNOSIS — C8290 Follicular lymphoma, unspecified, unspecified site: Secondary | ICD-10-CM | POA: Insufficient documentation

## 2018-03-13 DIAGNOSIS — C8214 Follicular lymphoma grade II, lymph nodes of axilla and upper limb: Secondary | ICD-10-CM

## 2018-03-13 NOTE — Progress Notes (Signed)
I called [name] and gave her the results of her biopsy.  She will see me 03/16/2018 to discuss.

## 2018-03-15 ENCOUNTER — Inpatient Hospital Stay: Payer: BLUE CROSS/BLUE SHIELD | Attending: Oncology | Admitting: Oncology

## 2018-03-15 ENCOUNTER — Inpatient Hospital Stay: Payer: BLUE CROSS/BLUE SHIELD

## 2018-03-15 ENCOUNTER — Telehealth: Payer: Self-pay | Admitting: Oncology

## 2018-03-15 VITALS — BP 138/80 | HR 83 | Temp 98.3°F | Resp 19 | Ht 63.0 in | Wt 203.1 lb

## 2018-03-15 DIAGNOSIS — Z79899 Other long term (current) drug therapy: Secondary | ICD-10-CM | POA: Diagnosis not present

## 2018-03-15 DIAGNOSIS — Z1501 Genetic susceptibility to malignant neoplasm of breast: Secondary | ICD-10-CM

## 2018-03-15 DIAGNOSIS — M8580 Other specified disorders of bone density and structure, unspecified site: Secondary | ICD-10-CM

## 2018-03-15 DIAGNOSIS — Z170 Estrogen receptor positive status [ER+]: Secondary | ICD-10-CM

## 2018-03-15 DIAGNOSIS — J40 Bronchitis, not specified as acute or chronic: Secondary | ICD-10-CM | POA: Insufficient documentation

## 2018-03-15 DIAGNOSIS — C8214 Follicular lymphoma grade II, lymph nodes of axilla and upper limb: Secondary | ICD-10-CM

## 2018-03-15 DIAGNOSIS — C50411 Malignant neoplasm of upper-outer quadrant of right female breast: Secondary | ICD-10-CM | POA: Diagnosis present

## 2018-03-15 DIAGNOSIS — C8234 Follicular lymphoma grade IIIa, lymph nodes of axilla and upper limb: Secondary | ICD-10-CM | POA: Insufficient documentation

## 2018-03-15 DIAGNOSIS — C50911 Malignant neoplasm of unspecified site of right female breast: Secondary | ICD-10-CM

## 2018-03-15 DIAGNOSIS — C773 Secondary and unspecified malignant neoplasm of axilla and upper limb lymph nodes: Secondary | ICD-10-CM | POA: Diagnosis not present

## 2018-03-15 LAB — CMP (CANCER CENTER ONLY)
ALT: 23 U/L (ref 0–44)
AST: 22 U/L (ref 15–41)
Albumin: 3.8 g/dL (ref 3.5–5.0)
Alkaline Phosphatase: 78 U/L (ref 38–126)
Anion gap: 10 (ref 5–15)
BUN: 9 mg/dL (ref 6–20)
CO2: 23 mmol/L (ref 22–32)
Calcium: 9.4 mg/dL (ref 8.9–10.3)
Chloride: 104 mmol/L (ref 98–111)
Creatinine: 0.77 mg/dL (ref 0.44–1.00)
GFR, Est AFR Am: >60 mL/min (ref >60)
Glucose, Bld: 97 mg/dL (ref 70–99)
Potassium: 3.7 mmol/L (ref 3.5–5.1)
Sodium: 137 mmol/L (ref 135–145)
TOTAL PROTEIN: 7.2 g/dL (ref 6.5–8.1)
Total Bilirubin: 0.5 mg/dL (ref 0.3–1.2)

## 2018-03-15 LAB — CBC WITH DIFFERENTIAL (CANCER CENTER ONLY)
Abs Immature Granulocytes: 0.03 10*3/uL (ref 0.00–0.07)
BASOS PCT: 1 %
Basophils Absolute: 0.1 10*3/uL (ref 0.0–0.1)
Eosinophils Absolute: 0.5 10*3/uL (ref 0.0–0.5)
Eosinophils Relative: 5 %
HCT: 40.8 % (ref 36.0–46.0)
Hemoglobin: 13.6 g/dL (ref 12.0–15.0)
Immature Granulocytes: 0 %
Lymphocytes Relative: 18 %
Lymphs Abs: 1.8 10*3/uL (ref 0.7–4.0)
MCH: 30.4 pg (ref 26.0–34.0)
MCHC: 33.3 g/dL (ref 30.0–36.0)
MCV: 91.1 fL (ref 80.0–100.0)
Monocytes Absolute: 1 10*3/uL (ref 0.1–1.0)
Monocytes Relative: 10 %
Neutro Abs: 6.6 10*3/uL (ref 1.7–7.7)
Neutrophils Relative %: 66 %
Platelet Count: 189 10*3/uL (ref 150–400)
RBC: 4.48 MIL/uL (ref 3.87–5.11)
RDW: 13.2 % (ref 11.5–15.5)
WBC: 10 10*3/uL (ref 4.0–10.5)
nRBC: 0 % (ref 0.0–0.2)

## 2018-03-15 LAB — LACTATE DEHYDROGENASE: LDH: 231 U/L — ABNORMAL HIGH (ref 98–192)

## 2018-03-15 MED ORDER — AZITHROMYCIN 250 MG PO TABS: ORAL_TABLET | ORAL | 0 refills | Status: DC

## 2018-03-15 MED ORDER — BENZONATATE 100 MG PO CAPS: 100.0000 mg | ORAL_CAPSULE | Freq: Three times a day (TID) | ORAL | 0 refills | Status: DC | PRN

## 2018-03-15 NOTE — Telephone Encounter (Signed)
Gave avs and calendar ° °

## 2018-03-15 NOTE — Progress Notes (Addendum)
ID: [name]   DOB: 08-Dec-1960  MR#: [id number]  EVO#:[id number]   PCP:  [name], MD GYN: [name] M.D. SU:  [name], MD OTHER:   [name], MD  CHIEF COMPLAINT:  Right Breast Cancer with BRCA2 mutation; follicular lymphoma grade 3A  CURRENT TREATMENT: tamoxifen  HISTORY OF PRESENT ILLNESS: From the original intake note:  [name] had screening mammography June 09, 2010 at the breast center.  This suggested a possible abnormality in the right breast.  She was brought back for additional views on April 4.  Dr. [name] was able to palpate a firm mobile mass at the 9 o'clock position 7 cm from the nipple.  Compression views confirmed an ill-defined mass with punctate and linear calcifications.  Ultrasound found this to be hypoechoic and to measure 1 cm.  In addition there was a lymph node with a diffusely thickened cortex measuring 1.5 cm in the right axilla.  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  With this information the patient was set up for breast MRI on April 13.  This showed in the posterior third of the upper outer quadrant of the right breast an irregular mass measuring 1.8 cm.  There was no multifocal or multicentric disease and there were at least two right axillary lymph nodes with thickened cortices.  There was no left axillary or left breast mass, no internal mammary chain adenopathy.  She proceeded to definitive therapy as detailed below.  INTERVAL HISTORY:  [name] returns today for follow-up and treatment of her breast cancer. She is accompanied by her husband today. She continues on anastrozole, with good tolerance.  She notes frequent hot flashes. She is taking gabapentin TID.   Since her last visit, she underwent bilateral breast MRI on 01/29/2018, which showed breast density category B and enlarged left axillary adenopathy.  She then underwent left axillary lymph node biopsy on 03/02/2018. Cytometry [phone]) from the procedure showed monoclonal B-cell population expressing CD10 consistent with non-Hodgkin B-cell lymphoma. Pathology [phone]) report specifies a follicular lymphoma, grade 3a.   Cherron reports "leaking" from the surgical site, a "clear yellow, sticky fluid."  She has no pain and has had no fever or bleeding from that procedure  She was scheduled for bilateral salpingo-oophorectomy, but the surgery was cancelled and has not yet been rescheduled.    REVIEW OF SYSTEMS:  [name] reports not feeling well today. She has a terrible cold with a productive cough. She notes the phlegm is dark yellow or green; she recognizes this as a sinus infection. The patient denies unusual headaches, visual changes, nausea, vomiting, stiff neck, dizziness, or gait imbalance. There has been no pleurisy, no chest pain or pressure, and no change in bowel or bladder habits. The patient denies rash, bleeding, unexplained fatigue or unexplained weight loss. A detailed review of systems was otherwise entirely negative.    PAST MEDICAL HISTORY: Past Medical History:  Diagnosis Date  . Anxiety   . Arthritis   . Breast cancer, right ([place]) 2012  . Chigger bite 12/24/2017  . De Quervain's syndrome (tenosynovitis)    Left wrist  . Depression   . Family  history of BRCA2 gene positive   . Family history of cancer   . Fibromyalgia   . History of radiation therapy 02/2011   R breast  . History of TMJ syndrome   . Hyperlipidemia   . Hypothyroid   . Left ankle injury 12/24/2017   swollen  . Obese   . Osteopenia 08/09/2011  . 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 Personal history of chemotherapy   .  Personal history of radiation therapy   . Staph infection    in sore on vagina a few years ago, "She said it was MRSA" but no tests were done    PAST SURGICAL HISTORY: Past Surgical History:  Procedure Laterality Date  . BREAST LUMPECTOMY WITH AXILLARY LYMPH NODE BIOPSY Right 07/08/2010  . MANDIBLE SURGERY  1991   due to accident as a child. sx to straighten dental bite.  . port a cath insertion    . PORT-A-CATH REMOVAL  12/28/2010   power port per patient  . RADIOACTIVE SEED GUIDED AXILLARY SENTINEL LYMPH NODE Left 03/02/2018   Procedure: SEED TARGETED DEEP LEFT AXILLARY LYMPH NODE BIOPSY;  Surgeon: [name], MD;  Location: [facility];  Service: General;  Laterality: Left;    FAMILY HISTORY Family History  Problem Relation Age of Onset  . Cancer Maternal Aunt 75       colon - great maternal aunt  . Cancer Paternal Aunt 65       gyn- unsure if ovarian or uterine- met to lung  . Breast cancer Maternal Aunt 18  . Breast cancer Mother 60  . Prostate cancer Paternal Uncle        dx >50  . Heart disease Maternal Grandmother 05-24-1978  . Pneumonia Maternal Grandfather 2  . Lung cancer Paternal Grandmother 55  . Leukemia Paternal Grandfather 22  . Pancreatic cancer Paternal Aunt 31  . Bladder Cancer Paternal Aunt 39  . Lung cancer Paternal Aunt   . Other Cousin        BRCA2 +  . Breast cancer Cousin        dx <50  . Colon cancer Neg Hx   The patient's father died 05-25-2015 apparently following multiple strokes. Patient's mother is passed away 22-Sep-2015, two weeks after being diagnosed with breast cancer, but she notes that her mother knew about a lump in her breast for 2 years and did not bring this to medical attention. She has no sisters, and had one brother, who is now deceased. She has a maternal aunt who was diagnosed with breast cancer before the age of 62 and a paternal aunt who was diagnosed with ovarian cancer before the age of 18.  GYNECOLOGIC HISTORY: Menarche age 14,  menopause about five to seven years ago.  She has been on hormone replacement for various periods most recently started estradiol June 10, 2010.  She never had any complications from any of the hormones she took and she has a Mirena IUD in place.  SOCIAL HISTORY:  (Updated January 2019) Charice and her husband of 26 years, [name], are Warehouse manager farmers.  The [employer] brings them the hens, they gather the eggs and then [employer] sets the eggs up to hatch.  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 They have two daughters "[name]" Era Parr, 30, who works in [place], but lives at home and [name] "Katie" Galena, 25, who has graduated from [employer]. Alencia notes  that her mother passed away in 09/22/16, two weeks after being diagnosed with breast cancer.   ADVANCED DIRECTIVES: not in place  HEALTH MAINTENANCE: (updated 01/17/2013) Social History   Tobacco Use  . Smoking status: Never Smoker  . Smokeless tobacco: Never Used  Substance Use Topics  . Alcohol use: Yes    Comment: <10 / year, holidays only  . Drug use: No     Colonoscopy: Never  PAP:  Bone density:  Osteopenia, May 2013  Lipid panel: Dr. Charlean Merl    No Known Allergies  Current Outpatient Medications  Medication Sig Dispense Refill  . alendronate (FOSAMAX) 35 MG tablet Take 35 mg by mouth every Wednesday. Take with a full glass of water on an empty stomach.    Marland Kitchen anastrozole (ARIMIDEX) 1 MG tablet Take 1 tablet (1 mg total) by mouth daily. 90 tablet 4  . azithromycin (ZITHROMAX) 250 MG tablet Take 2 tablets day one, then one tablet daily 6 each 0  . benzonatate (TESSALON) 100 MG capsule Take 1 capsule (100 mg total) by mouth 3 (three) times daily as needed for cough. 30 capsule 0  . chlorpheniramine (CHLOR-TRIMETON) 4 MG tablet Take 4 mg by mouth daily as needed for allergies.  14 tablet 0  . citalopram (CELEXA) 40 MG tablet Take 40 mg by mouth daily.     . Cyanocobalamin  (VITAMIN B-12) 1000 MCG SUBL Place 1,000 mcg under the tongue daily.    Marland Kitchen gabapentin (NEURONTIN) 300 MG capsule Take 1 capsule (300 mg total) by mouth 3 (three) times daily. 90 capsule 3  . hydrOXYzine (ATARAX/VISTARIL) 25 MG tablet Take 25 mg by mouth at bedtime as needed for anxiety.   0  . levothyroxine (SYNTHROID, LEVOTHROID) 88 MCG tablet Take 88 mcg by mouth daily before breakfast.    . meloxicam (MOBIC) 7.5 MG tablet Take 15 mg by mouth daily.     . Menthol, Topical Analgesic, (BLUE-EMU MAXIMUM STRENGTH EX) Apply 1 application topically daily as needed (for knee pain).    . mometasone (NASONEX) 50 MCG/ACT nasal spray Place 2 sprays into the nose at bedtime.   1  . Multiple Vitamin (MULTIVITAMIN) capsule Take 1 capsule by mouth 2 (two) times a week.     Marland Kitchen oxyCODONE (OXY IR/ROXICODONE) 5 MG immediate release tablet Take 1 tablet (5 mg total) by mouth every 6 (six) hours as needed for severe pain. 20 tablet 0  . pravastatin (PRAVACHOL) 40 MG tablet Take 40 mg by mouth at bedtime.   0  . traMADol (ULTRAM) 50 MG tablet Take 1 tablet (50 mg total) by mouth every 6 (six) hours as needed. (Patient not taking: Reported on 02/19/2018) 30 tablet 0   No current facility-administered medications for this visit.     OBJECTIVE: Middle-aged white woman wearing a mask  Vitals:   03/15/18 1418  BP: 138/80  Pulse: 83  Resp: 19  Temp: 98.3 F (36.8 C)  SpO2: 96%     Body mass index is 35.98 kg/m.    ECOG FS: 2  Filed Weights   03/15/18 1418  Weight: 203 lb 1.6 oz (92.1 kg)   Sclerae unicteric, EOMs intact Oropharynx clear and moist No cervical or supraclavicular adenopathy, no cervical or inguinal adenopathy Lungs no rales or rhonchi Heart regular rate and rhythm Abd soft, nontender, positive bowel sounds MSK no focal spinal tenderness, no upper extremity lymphedema Neuro: nonfocal, well oriented, appropriate affect Breasts: Right breast has undergone lumpectomy and radiation with no  evidence of local recurrence.  Left breast is benign.  Both axillae are benign.  LAB RESULTS: Lab Results  Component Value Date   WBC 10.0 03/15/2018   NEUTROABS 6.6 03/15/2018   HGB 13.6 03/15/2018   HCT 40.8 03/15/2018   MCV 91.1 03/15/2018   PLT 189 03/15/2018      Chemistry      Component Value Date/Time   NA 137 03/15/2018 1332   NA 142 08/05/2014 0818   K 3.7 03/15/2018 1332   K 3.8 08/05/2014 0818   CL 104 03/15/2018 1332   CL 105 07/09/2012 0808   CO2 23 03/15/2018 1332   CO2 23 08/05/2014 0818   BUN 9 03/15/2018 1332   BUN 10.7 08/05/2014 0818   CREATININE 0.77 03/15/2018 1332   CREATININE 0.8 08/05/2014 0818      Component Value Date/Time   CALCIUM 9.4 03/15/2018 1332   CALCIUM 8.5 08/05/2014 0818   ALKPHOS 78 03/15/2018 1332   ALKPHOS 62 08/05/2014 0818   AST 22 03/15/2018 1332   AST 22 08/05/2014 0818   ALT 23 03/15/2018 1332   ALT 20 08/05/2014 0818   BILITOT 0.5 03/15/2018 1332   BILITOT 0.59 08/05/2014 0818       STUDIES: Pathology report discussed at length with the patient  ASSESSMENT: 58 y.o. BRCA2 positive [place] woman   (1)  status post right lumpectomy and axillary lymph node dissection April 2012 for a T1c N1 M0, stage IIA invasive ductal carcinoma, grade 3, strongly estrogen and progesterone receptor positive, HER-2 negative with an MIB-1 of 26%,   (2)  treated adjuvantly with 4 cycles of dose dense doxorubicin and cyclophosphamide followed by 9 weekly doses of paclitaxel   (3)  radiation completed December of 2012.   (4)  She started letrozole 03/15/2011, switched to tamoxifen as of January 2016  (a) completed 5 years of antiestrogens February 2018  (b) anastrozole started April 2019  (5) Osteopenia, started alendronate (Fosamax) May 2013  (6) genetics testing 05/11/2017 through the Common Hereditary Cancers Panel + Myelodysplastic syndrome/Leukemia Panel was ordered from [facility] found a Positive: Pathogeic variant in BRCA2  [id number] (p.[id number]*7).   (a) there were no pathogenic mutations noted in APC, ATM, AXIN2, BARD1, BLM, BMPR1A, BRCA1, BRIP1, CDH1, CDK4, CDKN2A (p14ARF), CDKN2A (p16INK4a), CEBPA, CHEK2, CTNNA1, DICER1, EPCAM*, GATA2, GREM1*, HRAS, KIT, MEN1, MLH1, MSH2, MSH3, MSH6, MUTYH, NBN, NF1, PALB2, PDGFRA, PMS2, POLD1, POLE, PTEN, RAD50, RAD51C, RAD51D, RUNX1, SDHB, SDHC, SDHD, SMAD4, SMARCA4, STK11, TERC, TERT, TP53, TSC1, TSC2, VHL. The following genes were evaluated for sequence changes only:HOXB13*, NTHL1*, SDHA.  (b) A variant of uncertain significance in the gene MSH3 was also identified c.2695A>G (p.Met899Val).   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  CD5 negative  (b) CT scans of the chest abdomen and pelvis 02/21/2018 shows measurable adenopathy both above and below the diaphragm (largest lesions 1.5 cm left axilla, 1.3 cm left inguinal area).  (c) FLIPI2 at diagnosis: <60 y/o, Hb >12, largest node <6.0 cm; beta-2-microglobulin pending; bone marrow not obtained  (  d) HIV and hepatitis B studies drawn 03/15/2018  (11) DEXA scan 07/31/2013 showed a T score of -1.8   PLAN:  Leea is now 7-1/2 years out from definitive surgery for her breast cancer with no evidence of disease activity.  This is very favorable.    She continues on anastrozole with fair tolerance. The plan is to continue this for a minimum of 2 years or a maximum of 4.  She does have a history of osteopenia and will have a repeat DEXA scan with  mammography in April 2020  Today we spent approximately 60 minutes going over her new diagnosis of follicular lymphoma.  We discussed the immune system dysfunction, the lymphatic system within the immune system, and the role of B cells within the immune system.  We discussed the difference between follicular and diffuse lymphomas, between B-cell and T-cell lymphomas, and between grade 3A and grade 3B follicular lymphomas.  We discussed the fact that in the absence of specific indications for treatment grade 3A follicular lymphomas can be followed.  At present she has no significant adenopathy, no "B" symptoms, no anemia, thrombocytopenia, or other indication for treatment.  Her FLIPI2 score is low risk.  We could proceed to PET scan and bone marrow biopsy at this point but in the absence of any treatment indication I would wait on those tests.  Instead we will follow her counts, LDH, beta-2 microglobulin's, and exam.  We will consider a PET scan December 2020.  As far as her BRCA2 positivity is concerned she is considering bilateral salpingo-oophorectomy, which I have encouraged  For her current bronchits prescribed a azithromycin.  She will let me know if she has any side effects from that medication  She will see me again in May.  She knows to call for any other issues that may develop before that visit.    Brettany Sydney, Virgie Dad, MD  03/15/18 5:51 PM Medical Oncology and H[facility] [address] [place],  [address] Tel. [phone]    Fax. [phone]    I, Wilburn Mylar am acting as scribe for Dr. Virgie Dad. Alizay Bronkema.  I, Lurline Del MD, have reviewed the above documentation for accuracy and completeness, and I agree with the above.

## 2018-03-16 LAB — HEPATITIS B SURFACE ANTIGEN: Hepatitis B Surface Ag: NEGATIVE

## 2018-03-16 LAB — BETA 2 MICROGLOBULIN, SERUM: BETA 2 MICROGLOBULIN: 1.8 mg/L (ref 0.6–2.4)

## 2018-03-16 LAB — HIV ANTIBODY (ROUTINE TESTING W REFLEX): HIV Screen 4th Generation wRfx: NONREACTIVE

## 2018-03-16 LAB — HEPATITIS B CORE ANTIBODY, IGM: Hep B C IgM: NEGATIVE

## 2018-07-20 ENCOUNTER — Other Ambulatory Visit: Payer: Self-pay | Admitting: Oncology

## 2018-08-03 ENCOUNTER — Inpatient Hospital Stay: Payer: BLUE CROSS/BLUE SHIELD | Attending: Oncology

## 2018-08-03 ENCOUNTER — Other Ambulatory Visit: Payer: Self-pay

## 2018-08-03 DIAGNOSIS — C8234 Follicular lymphoma grade IIIa, lymph nodes of axilla and upper limb: Secondary | ICD-10-CM | POA: Insufficient documentation

## 2018-08-03 DIAGNOSIS — C50411 Malignant neoplasm of upper-outer quadrant of right female breast: Secondary | ICD-10-CM | POA: Insufficient documentation

## 2018-08-03 DIAGNOSIS — Z170 Estrogen receptor positive status [ER+]: Secondary | ICD-10-CM | POA: Insufficient documentation

## 2018-08-03 DIAGNOSIS — Z1501 Genetic susceptibility to malignant neoplasm of breast: Secondary | ICD-10-CM | POA: Insufficient documentation

## 2018-08-03 DIAGNOSIS — C773 Secondary and unspecified malignant neoplasm of axilla and upper limb lymph nodes: Secondary | ICD-10-CM | POA: Insufficient documentation

## 2018-08-03 DIAGNOSIS — N951 Menopausal and female climacteric states: Secondary | ICD-10-CM | POA: Diagnosis not present

## 2018-08-03 LAB — CBC WITH DIFFERENTIAL/PLATELET
Abs Immature Granulocytes: 0.02 10*3/uL (ref 0.00–0.07)
Basophils Absolute: 0.1 10*3/uL (ref 0.0–0.1)
Basophils Relative: 1 %
Eosinophils Absolute: 0.5 10*3/uL (ref 0.0–0.5)
Eosinophils Relative: 8 %
HCT: 43.3 % (ref 36.0–46.0)
Hemoglobin: 14.3 g/dL (ref 12.0–15.0)
Immature Granulocytes: 0 %
Lymphocytes Relative: 26 %
Lymphs Abs: 1.7 10*3/uL (ref 0.7–4.0)
MCH: 30.3 pg (ref 26.0–34.0)
MCHC: 33 g/dL (ref 30.0–36.0)
MCV: 91.7 fL (ref 80.0–100.0)
Monocytes Absolute: 0.6 10*3/uL (ref 0.1–1.0)
Monocytes Relative: 10 %
Neutro Abs: 3.6 10*3/uL (ref 1.7–7.7)
Neutrophils Relative %: 55 %
Platelets: 196 10*3/uL (ref 150–400)
RBC: 4.72 MIL/uL (ref 3.87–5.11)
RDW: 13.5 % (ref 11.5–15.5)
WBC: 6.4 10*3/uL (ref 4.0–10.5)
nRBC: 0 % (ref 0.0–0.2)

## 2018-08-03 LAB — COMPREHENSIVE METABOLIC PANEL
ALT: 18 U/L (ref 0–44)
AST: 19 U/L (ref 15–41)
Albumin: 3.9 g/dL (ref 3.5–5.0)
Alkaline Phosphatase: 70 U/L (ref 38–126)
Anion gap: 10 (ref 5–15)
BUN: 13 mg/dL (ref 6–20)
CO2: 26 mmol/L (ref 22–32)
Calcium: 9.4 mg/dL (ref 8.9–10.3)
Chloride: 103 mmol/L (ref 98–111)
Creatinine, Ser: 0.88 mg/dL (ref 0.44–1.00)
GFR calc Af Amer: >60 mL/min (ref >60)
GFR calc non Af Amer: >60 mL/min (ref >60)
Glucose, Bld: 99 mg/dL (ref 70–99)
Potassium: 4.4 mmol/L (ref 3.5–5.1)
Sodium: 139 mmol/L (ref 135–145)
Total Bilirubin: 0.6 mg/dL (ref 0.3–1.2)
Total Protein: 7.4 g/dL (ref 6.5–8.1)

## 2018-08-03 LAB — LACTATE DEHYDROGENASE: LDH: 208 U/L — ABNORMAL HIGH (ref 98–192)

## 2018-08-07 LAB — BETA 2 MICROGLOBULIN, SERUM: Beta-2 Microglobulin: 1.7 mg/L (ref 0.6–2.4)

## 2018-08-08 NOTE — Progress Notes (Signed)
ID: [name]   DOB: [birth year]  MR#: [id number]  CLE#:[id number]   Patient Care Team: Cordial, Launa Grill, MD as PCP - General Joyanna Kleman, Virgie Dad, MD as Consulting Physician (Hematology and Oncology) [name], MD as Consulting Physician (General Surgery) [name], MD as Consulting Physician (Radiation Oncology) Vania Rea, MD as Consulting Physician (Obstetrics and Gynecology) Dorothyann Gibbs, NP as Nurse Practitioner (Gynecologic Oncology) Isabel Caprice, MD as Consulting Physician (Gynecologic Oncology) Maisie Fus, MD as Consulting Physician (Obstetrics and Gynecology) OTHER MD:   CHIEF COMPLAINT:  Right Breast Cancer with BRCA2 mutation; follicular lymphoma grade 3A  CURRENT TREATMENT: anastrozole   INTERVAL HISTORY:  [name] returns today for follow-up and treatment of her breast cancer.    She continues on anastrozole, with good tolerance. She notes frequent mild to moderate hot flashes. She states they don't wake her up at night (although her bladder does). She is taking gabapentin TID. She reports she tried to go off of her gabapentin, but this cause her to shake.   She saw Dr. Barry Dienes on 04/06/2018 for follow up. [name] requested to reschedule her bilateral salpingo-oophorectomy for fall 2020 due to family responsibilities. She is scheduled for follow up in August 2020.  As far as her follicular lymphoma is concerned, she has had no unexplained weight loss, unexplained fatigue, recurrent fevers, rash, or worsening adenopathy.  She continues on observation.   REVIEW OF SYSTEMS:  [name] reports doing well overall. She states she has a sinus infection again today. She reports she and her husband wake up very early, around 4 am, to care for her chickens. The patient denies unusual headaches, visual changes, nausea, vomiting, stiff neck, dizziness, or gait imbalance. There has been no cough, phlegm production, or pleurisy, no chest pain or pressure, and no change  in bowel or bladder habits. The patient denies fever, rash, bleeding, unexplained fatigue or unexplained weight loss. A detailed review of systems was otherwise entirely negative.   BREAST CANCER HISTORY: From the original intake note:  [name] had screening mammography 06/09/2010 at the breast center.  This suggested a possible abnormality in the right breast.  She was brought back for additional views on 06/16/2010.  Dr. [name] was able to palpate a firm mobile mass at the 9 o'clock position 7 cm from the nipple.  Compression views confirmed an ill-defined mass with punctate and linear calcifications.  Ultrasound found this to be hypoechoic and to measure 1 cm.  In addition there was a lymph node with a diffusely thickened cortex measuring 1.5 cm in the right axilla.  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  With this information the patient was set up for breast MRI on 06/25/2010.  This showed in the posterior third of the upper outer quadrant of the right breast an irregular mass measuring 1.8 cm.  There was no multifocal or multicentric disease and there were at least two right axillary lymph nodes with thickened cortices.  There was no left axillary or left breast mass, no internal mammary chain adenopathy.  LYMPHOMA HISTORY: She underwent bilateral breast MRI on 01/29/2018, which showed breast density category B and enlarged  left axillary adenopathy.  She then underwent left axillary lymph node biopsy on 03/02/2018. Cytometry [id number]) from the procedure showed monoclonal B-cell population expressing CD10 consistent with non-Hodgkin B-cell lymphoma. Pathology  [id number]) report specifies a follicular lymphoma, grade 3a.   She proceeded to definitive therapy as detailed below.   PAST MEDICAL HISTORY: Past Medical History:  Diagnosis Date  . Anxiety   . Arthritis   . Breast cancer, right ([place]) 2012  . Chigger bite 12/24/2017  . De Quervain's syndrome (tenosynovitis)    Left wrist  . Depression   . Family history of BRCA2 gene positive   . Family history of cancer   . Fibromyalgia   . History of radiation therapy 02/2011   R breast  . History of TMJ syndrome   . Hyperlipidemia   . Hypothyroid   . Left ankle injury 12/24/2017   swollen  . Obese   . Osteopenia 08/09/2011  . 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 Personal history of chemotherapy   . Personal history of radiation therapy   . Staph infection    in sore on vagina a few years ago, "She said it was MRSA" but no tests were done    PAST SURGICAL HISTORY: Past Surgical History:  Procedure Laterality Date  . BREAST LUMPECTOMY WITH AXILLARY LYMPH NODE BIOPSY Right 07/08/2010  . MANDIBLE SURGERY  1991   due to accident as a child. sx to straighten dental bite.  . port a cath insertion    . PORT-A-CATH REMOVAL  12/28/2010   power port per patient  . RADIOACTIVE SEED GUIDED AXILLARY SENTINEL LYMPH NODE Left 03/02/2018   Procedure: SEED TARGETED DEEP LEFT AXILLARY LYMPH NODE BIOPSY;  Surgeon: [name], MD;  Location: [facility];  Service: General;  Laterality: Left;    FAMILY HISTORY Family History  Problem Relation Age of Onset  . Cancer Maternal Aunt 75       colon - great maternal aunt  . Cancer Paternal Aunt 51       gyn- unsure if ovarian or uterine- met to lung  . Breast cancer Maternal Aunt 90  . Breast cancer Mother 79  . Prostate cancer Paternal Uncle        dx >50  . Heart disease Maternal Grandmother 80  . Pneumonia Maternal Grandfather 49  . Lung cancer Paternal Grandmother 68  . Leukemia Paternal Grandfather 43  . Pancreatic cancer Paternal  Aunt 65  . Bladder Cancer Paternal Aunt 63  . Lung cancer Paternal Aunt   . Other Cousin        BRCA2 +  . Breast cancer Cousin        dx <50  . Colon cancer Neg Hx   The patient's father died 05/11/2015 apparently following multiple strokes. Patient's mother is passed away Sep 08, 2015, two weeks after being diagnosed with breast cancer, but she notes that her mother knew about a lump in her breast for 2 years and did not bring this to medical attention. She has no sisters, and had one brother, who is now deceased. She has a maternal aunt who was diagnosed with breast cancer before the age of 40 and a paternal aunt who was diagnosed with ovarian cancer before the age of 58.   GYNECOLOGIC HISTORY: Menarche age 40, menopause about five to seven years ago.  She has been on hormone replacement for various periods most recently started estradiol June 10, 2010.  She never had any complications  from any of the hormones she took and she has a Mirena IUD in place.   SOCIAL HISTORY:  (Updated January 2019) Timika and her husband of 26 years, [name], are Warehouse manager farmers.  The [employer] brings them the hens, they gather the eggs and then [employer] sets the eggs up to hatch.  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 They have two daughters "[name]" [name], 30, who works in [place], but lives at home and [name], 25, who has graduated from [employer]. Raiden notes that her mother passed away in 08/20/16, two weeks after being diagnosed with breast cancer.    ADVANCED DIRECTIVES: not in place   HEALTH MAINTENANCE: (updated 01/17/2013) Social History   Tobacco Use  . Smoking status: Never Smoker  . Smokeless tobacco: Never Used  Substance Use Topics  . Alcohol use: Yes    Comment: <10 / year, holidays only  . Drug use: No     Colonoscopy: Never  PAP:  Bone density:  Osteopenia, May 2013  Lipid panel: Dr. Charlean Merl    No Known  Allergies  Current Outpatient Medications  Medication Sig Dispense Refill  . [START ON 08/15/2018] alendronate (FOSAMAX) 35 MG tablet Take 1 tablet (35 mg total) by mouth every Wednesday. Take with a full glass of water on an empty stomach. 90 tablet 0  . anastrozole (ARIMIDEX) 1 MG tablet Take 1 tablet (1 mg total) by mouth daily. 90 tablet 4  . chlorpheniramine (CHLOR-TRIMETON) 4 MG tablet Take 4 mg by mouth daily as needed for allergies.  14 tablet 0  . citalopram (CELEXA) 40 MG tablet Take 1 tablet (40 mg total) by mouth daily. 90 tablet 4  . Cyanocobalamin (VITAMIN B-12) 1000 MCG SUBL Place 1,000 mcg under the tongue daily.    Marland Kitchen gabapentin (NEURONTIN) 300 MG capsule Take 1 capsule (300 mg total) by mouth 3 (three) times daily. 90 capsule 3  . levothyroxine (SYNTHROID, LEVOTHROID) 88 MCG tablet Take 88 mcg by mouth daily before breakfast.    . meloxicam (MOBIC) 7.5 MG tablet Take 2 tablets (15 mg total) by mouth daily. 60 tablet 6  . mometasone (NASONEX) 50 MCG/ACT nasal spray Place 2 sprays into the nose at bedtime. 17 g 1  . Multiple Vitamin (MULTIVITAMIN) capsule Take 1 capsule by mouth 2 (two) times a week.     . pravastatin (PRAVACHOL) 40 MG tablet Take 1 tablet (40 mg total) by mouth at bedtime. 90 tablet 4   No current facility-administered medications for this visit.     OBJECTIVE: Middle-aged white woman in no acute distress  Vitals:   08/09/18 1150  BP: 138/79  Pulse: 75  Resp: 18  Temp: 98.2 F (36.8 C)  SpO2: 97%     Body mass index is 35.68 kg/m.    ECOG FS: 1  Filed Weights   08/09/18 1150  Weight: 201 lb 6.4 oz (91.4 kg)   Sclerae unicteric, pupils round and equal Wearing a mask No cervical or supraclavicular adenopathy, no axillary or inguinal adenopathy Lungs no rales or rhonchi Heart regular rate and rhythm Abd soft, nontender, positive bowel sounds MSK no focal spinal tenderness, no upper extremity lymphedema Neuro: nonfocal, well oriented, appropriate  affect Breasts: Right breast is status post lumpectomy and radiation.  There is no evidence of local recurrence.  The left breast is benign.  Both axillae are benign.   LAB RESULTS: Lab Results  Component Value  Date   WBC 6.4 08/03/2018   NEUTROABS 3.6 08/03/2018   HGB 14.3 08/03/2018   HCT 43.3 08/03/2018   MCV 91.7 08/03/2018   PLT 196 08/03/2018      Chemistry      Component Value Date/Time   NA 139 08/03/2018 0752   NA 142 08/05/2014 0818   K 4.4 08/03/2018 0752   K 3.8 08/05/2014 0818   CL 103 08/03/2018 0752   CL 105 07/09/2012 0808   CO2 26 08/03/2018 0752   CO2 23 08/05/2014 0818   BUN 13 08/03/2018 0752   BUN 10.7 08/05/2014 0818   CREATININE 0.88 08/03/2018 0752   CREATININE 0.77 03/15/2018 1332   CREATININE 0.8 08/05/2014 0818      Component Value Date/Time   CALCIUM 9.4 08/03/2018 0752   CALCIUM 8.5 08/05/2014 0818   ALKPHOS 70 08/03/2018 0752   ALKPHOS 62 08/05/2014 0818   AST 19 08/03/2018 0752   AST 22 03/15/2018 1332   AST 22 08/05/2014 0818   ALT 18 08/03/2018 0752   ALT 23 03/15/2018 1332   ALT 20 08/05/2014 0818   BILITOT 0.6 08/03/2018 0752   BILITOT 0.5 03/15/2018 1332   BILITOT 0.59 08/05/2014 0818     Beta-2 microglobulin is normal.  LDH is minimally elevated, and no more than 4 months ago  STUDIES: No results found.   ASSESSMENT: 58 y.o. BRCA2 positive [name] woman   (1)  status post right lumpectomy and axillary lymph node dissection April 2012 for a T1c N1 M0, stage IIA invasive ductal carcinoma, grade 3, strongly estrogen and progesterone receptor positive, HER-2 negative with an MIB-1 of 26%,   (2)  treated adjuvantly with 4 cycles of dose dense doxorubicin and cyclophosphamide followed by 9 weekly doses of paclitaxel   (3)  radiation completed December of 2012.   (4)  She started letrozole 03/15/2011, switched to tamoxifen as of January 2016  (a) completed 5 years of antiestrogens February 2018  (b) anastrozole started  April 2019  (5) Osteopenia, started alendronate (Fosamax) May 2013  (6) genetics testing 05/11/2017 through the Common Hereditary Cancers Panel + Myelodysplastic syndrome/Leukemia Panel was ordered from [facility] found a Positive: Pathogeic variant in BRCA2 c.6275_6276del (p.Leu2092Profs*7).   (a) there were no pathogenic mutations noted in APC, ATM, AXIN2, BARD1, BLM, BMPR1A, BRCA1, BRIP1, CDH1, CDK4, CDKN2A (p14ARF), CDKN2A (p16INK4a), CEBPA, CHEK2, CTNNA1, DICER1, EPCAM*, GATA2, GREM1*, HRAS, KIT, MEN1, MLH1, MSH2, MSH3, MSH6, MUTYH, NBN, NF1, PALB2, PDGFRA, PMS2, POLD1, POLE, PTEN, RAD50, RAD51C, RAD51D, RUNX1, SDHB, SDHC, SDHD, SMAD4, SMARCA4, STK11, TERC, TERT, TP53, TSC1, TSC2, VHL. The following genes were evaluated for sequence changes only:HOXB13*, NTHL1*, SDHA.  (b) A variant of uncertain significance in the gene MSH3 was also identified c.2695A>G (p.Met899Val).   (7) intensified screening:   (a) mammography every April  (b) breast MRI every October   (c) bi-annual breast exam  (8) breast cancer risk reduction: Anastrozole started 06/19/2017  (a) bone density agrees per imaging 07/31/2013 showed a T score -2.2  (9) ovarian cancer risk reduction: BSO being planned for later this year  (10) follicular B cell non-Hodgkin's lymphoma grade 3A diagnosed 03/02/2018 through left axillary lymph node biopsy and flow cytometry  (a) the cells are CD10, CD19, CD23, and HLA-DR positive, kappa restricted.  CD5 negative  (b) CT scans of the chest abdomen and pelvis 02/21/2018 shows measurable adenopathy both above and below the diaphragm (largest lesions 1.5 cm left axilla, 1.3 cm left inguinal area).  (c) FLIPI2 at  diagnosis: <60 y/o, Hb >12, largest node <6.0 cm; beta-2-microglobulin 1.8 (WNL); bone marrow not obtained  (d) HIV and hepatitis B studies 03/15/2018 negative  (11) DEXA scan 07/31/2013 showed a T score of -1.8   PLAN:  Tanicia is now 8 years out from definitive surgery for her  breast cancer with no evidence of disease recurrence.  This is very favorable.    She is tolerating anastrozole well.  With 1 more year she will be done with antiestrogens.  She has not had a bone density in some time and we are adding that with her next set of mammograms.  Given the BRCA2 mutation she understands she will need bilateral salpingo-oophorectomy.  She was going to have that under Dr. Gerarda Fraction and that fell through so we will try to get that scheduled later this year.  Her follicular lymphoma right now requires no intervention other than follow-up.  She will return in 4 months with labs at visit.  She knows to call for any other issues that may develop before then.   Lekita Kerekes, Virgie Dad, MD  08/09/18 12:58 PM Medical Oncology and Hematology [facility] [address] [place], [place] [address] Tel. [phone]    Fax. [phone]    I, Wilburn Mylar am acting as scribe for Dr. Virgie Dad. Ananda Caya.  I, Lurline Del MD, have reviewed the above documentation for accuracy and completeness, and I agree with the above.

## 2018-08-09 ENCOUNTER — Other Ambulatory Visit: Payer: Self-pay

## 2018-08-09 ENCOUNTER — Inpatient Hospital Stay (HOSPITAL_BASED_OUTPATIENT_CLINIC_OR_DEPARTMENT_OTHER): Payer: BLUE CROSS/BLUE SHIELD | Admitting: Oncology

## 2018-08-09 VITALS — BP 138/79 | HR 75 | Temp 98.2°F | Resp 18 | Ht 63.0 in | Wt 201.4 lb

## 2018-08-09 DIAGNOSIS — Z170 Estrogen receptor positive status [ER+]: Secondary | ICD-10-CM

## 2018-08-09 DIAGNOSIS — C773 Secondary and unspecified malignant neoplasm of axilla and upper limb lymph nodes: Secondary | ICD-10-CM

## 2018-08-09 DIAGNOSIS — N951 Menopausal and female climacteric states: Secondary | ICD-10-CM

## 2018-08-09 DIAGNOSIS — Z1501 Genetic susceptibility to malignant neoplasm of breast: Secondary | ICD-10-CM

## 2018-08-09 DIAGNOSIS — C50411 Malignant neoplasm of upper-outer quadrant of right female breast: Secondary | ICD-10-CM

## 2018-08-09 DIAGNOSIS — C8234 Follicular lymphoma grade IIIa, lymph nodes of axilla and upper limb: Secondary | ICD-10-CM | POA: Diagnosis not present

## 2018-08-09 DIAGNOSIS — C8200 Follicular lymphoma grade I, unspecified site: Secondary | ICD-10-CM

## 2018-08-09 DIAGNOSIS — C50911 Malignant neoplasm of unspecified site of right female breast: Secondary | ICD-10-CM

## 2018-08-09 MED ORDER — MELOXICAM 7.5 MG PO TABS: 15.0000 mg | ORAL_TABLET | Freq: Every day | ORAL | 6 refills | Status: DC

## 2018-08-09 MED ORDER — GABAPENTIN 300 MG PO CAPS: 300.0000 mg | ORAL_CAPSULE | Freq: Three times a day (TID) | ORAL | 3 refills | Status: DC

## 2018-08-09 MED ORDER — PRAVASTATIN SODIUM 40 MG PO TABS: 40.0000 mg | ORAL_TABLET | Freq: Every day | ORAL | 4 refills | Status: DC

## 2018-08-09 MED ORDER — ALENDRONATE SODIUM 35 MG PO TABS: 35.0000 mg | ORAL_TABLET | ORAL | 0 refills | Status: DC

## 2018-08-09 MED ORDER — ANASTROZOLE 1 MG PO TABS: 1.0000 mg | ORAL_TABLET | Freq: Every day | ORAL | 4 refills | Status: DC

## 2018-08-09 MED ORDER — CITALOPRAM HYDROBROMIDE 40 MG PO TABS: 40.0000 mg | ORAL_TABLET | Freq: Every day | ORAL | 4 refills | Status: DC

## 2018-08-09 MED ORDER — PRAVASTATIN SODIUM 40 MG PO TABS: 40.0000 mg | ORAL_TABLET | Freq: Every day | ORAL | 0 refills | Status: DC

## 2018-08-09 MED ORDER — MOMETASONE FUROATE 50 MCG/ACT NA SUSP: 2.0000 | Freq: Every day | NASAL | 1 refills | Status: DC

## 2018-10-29 ENCOUNTER — Telehealth: Payer: Self-pay | Admitting: *Deleted

## 2018-10-29 NOTE — Telephone Encounter (Signed)
Called and scheduled the patient to see Dr. Denman George on 10/19

## 2018-12-13 ENCOUNTER — Telehealth: Payer: Self-pay | Admitting: Oncology

## 2018-12-13 NOTE — Telephone Encounter (Signed)
[facility] PAL 10/19 appointments moved from 10/19 to 10/23. Left message. Schedule mailed.

## 2018-12-31 ENCOUNTER — Telehealth: Payer: Self-pay

## 2018-12-31 ENCOUNTER — Other Ambulatory Visit: Payer: BLUE CROSS/BLUE SHIELD

## 2018-12-31 ENCOUNTER — Encounter: Payer: Self-pay | Admitting: Gynecologic Oncology

## 2018-12-31 ENCOUNTER — Ambulatory Visit: Payer: BLUE CROSS/BLUE SHIELD | Admitting: Oncology

## 2018-12-31 ENCOUNTER — Other Ambulatory Visit: Payer: Self-pay

## 2018-12-31 ENCOUNTER — Inpatient Hospital Stay: Payer: BLUE CROSS/BLUE SHIELD | Attending: Gynecologic Oncology | Admitting: Gynecologic Oncology

## 2018-12-31 VITALS — BP 140/87 | HR 92 | Temp 98.2°F | Resp 18 | Ht 65.0 in | Wt 207.2 lb

## 2018-12-31 DIAGNOSIS — Z170 Estrogen receptor positive status [ER+]: Secondary | ICD-10-CM | POA: Diagnosis not present

## 2018-12-31 DIAGNOSIS — R232 Flushing: Secondary | ICD-10-CM | POA: Diagnosis not present

## 2018-12-31 DIAGNOSIS — N951 Menopausal and female climacteric states: Secondary | ICD-10-CM | POA: Insufficient documentation

## 2018-12-31 DIAGNOSIS — C8234 Follicular lymphoma grade IIIa, lymph nodes of axilla and upper limb: Secondary | ICD-10-CM | POA: Insufficient documentation

## 2018-12-31 DIAGNOSIS — Z1501 Genetic susceptibility to malignant neoplasm of breast: Secondary | ICD-10-CM

## 2018-12-31 DIAGNOSIS — E785 Hyperlipidemia, unspecified: Secondary | ICD-10-CM | POA: Diagnosis not present

## 2018-12-31 DIAGNOSIS — Z791 Long term (current) use of non-steroidal anti-inflammatories (NSAID): Secondary | ICD-10-CM | POA: Insufficient documentation

## 2018-12-31 DIAGNOSIS — F329 Major depressive disorder, single episode, unspecified: Secondary | ICD-10-CM | POA: Insufficient documentation

## 2018-12-31 DIAGNOSIS — C50411 Malignant neoplasm of upper-outer quadrant of right female breast: Secondary | ICD-10-CM | POA: Diagnosis not present

## 2018-12-31 DIAGNOSIS — Z1502 Genetic susceptibility to malignant neoplasm of ovary: Secondary | ICD-10-CM

## 2018-12-31 DIAGNOSIS — Z803 Family history of malignant neoplasm of breast: Secondary | ICD-10-CM | POA: Insufficient documentation

## 2018-12-31 DIAGNOSIS — Z79899 Other long term (current) drug therapy: Secondary | ICD-10-CM | POA: Insufficient documentation

## 2018-12-31 DIAGNOSIS — Z7951 Long term (current) use of inhaled steroids: Secondary | ICD-10-CM | POA: Insufficient documentation

## 2018-12-31 DIAGNOSIS — Z148 Genetic carrier of other disease: Secondary | ICD-10-CM

## 2018-12-31 DIAGNOSIS — Z1509 Genetic susceptibility to other malignant neoplasm: Secondary | ICD-10-CM

## 2018-12-31 DIAGNOSIS — F419 Anxiety disorder, unspecified: Secondary | ICD-10-CM | POA: Insufficient documentation

## 2018-12-31 DIAGNOSIS — M8580 Other specified disorders of bone density and structure, unspecified site: Secondary | ICD-10-CM | POA: Diagnosis not present

## 2018-12-31 DIAGNOSIS — E039 Hypothyroidism, unspecified: Secondary | ICD-10-CM | POA: Insufficient documentation

## 2018-12-31 DIAGNOSIS — Z79810 Long term (current) use of selective estrogen receptor modulators (SERMs): Secondary | ICD-10-CM | POA: Insufficient documentation

## 2018-12-31 MED ORDER — IBUPROFEN 800 MG PO TABS: 800.0000 mg | ORAL_TABLET | Freq: Three times a day (TID) | ORAL | 0 refills | Status: DC | PRN

## 2018-12-31 MED ORDER — OXYCODONE HCL 5 MG PO TABS: 5.0000 mg | ORAL_TABLET | ORAL | 0 refills | Status: DC | PRN

## 2018-12-31 MED ORDER — SENNOSIDES-DOCUSATE SODIUM 8.6-50 MG PO TABS: 2.0000 | ORAL_TABLET | Freq: Every day | ORAL | 0 refills | Status: DC

## 2018-12-31 NOTE — Patient Instructions (Signed)
Preparing for your Surgery  Plan for surgery with Dr. Everitt Amber at [facility]. You will be scheduled for a robotic assisted bilateral salpingo-oophorectomy. In December 2020, December 14 is available. If you would like a Jan 2021, I can also notify you when the January schedule comes out.  Pre-operative Testing -You will receive a phone call from presurgical testing at [facility] if you have not received a call already to arrange for a pre-operative testing appointment before your surgery.  This appointment normally occurs one to two weeks before your scheduled surgery.   -Bring your insurance card, copy of an advanced directive if applicable, medication list  -At that visit, you will be asked to sign a consent for a possible blood transfusion in case a transfusion becomes necessary during surgery.  The need for a blood transfusion is rare but having consent is a necessary part of your care.     -You should not be taking blood thinners or aspirin at least ten days prior to surgery unless instructed by your surgeon.  -Do not take supplements such as fish oil (omega 3), red yeast rice, tumeric before your surgery.  Day Before Surgery at [facility] will be asked to take in a light diet the day before surgery.  Avoid carbonated beverages.  You will be advised to have nothing to eat or drink after midnight the evening before.    Eat a light diet the day before surgery.  Examples including soups, broths, toast, yogurt, mashed potatoes.  Things to avoid include carbonated beverages (fizzy beverages), raw fruits and raw vegetables, or beans.   If your bowels are filled with gas, your surgeon will have difficulty visualizing your pelvic organs which increases your surgical risks.  Your role in recovery Your role is to become active as soon as directed by your doctor, while still giving yourself time to heal.  Rest when you feel tired. You will be asked to do the  following in order to speed your recovery:  - Cough and breathe deeply. This helps toclear and expand your lungs and can prevent pneumonia.  - Do mild physical activity. Walking or moving your legs help your circulation and body functions return to normal. A staff member will help you when you try to walk and will provide you with simple exercises. Do not try to get up or walk alone the first time. - Actively manage your pain. Managing your pain lets you move in comfort. We will ask you to rate your pain on a scale of zero to 10. It is your responsibility to tell your doctor or nurse where and how much you hurt so your pain can be treated.  Special Considerations -If you are diabetic, you may be placed on insulin after surgery to have closer control over your blood sugars to promote healing and recovery.  This does not mean that you will be discharged on insulin.  If applicable, your oral antidiabetics will be resumed when you are tolerating a solid diet.  -Your final pathology results from surgery should be available around one week after surgery and the results will be relayed to you when available.  -FMLA forms can be faxed to [phone] and please allow 5-7 business days for completion.  Pain Management After Surgery -You have been prescribed your pain medication and bowel regimen medications before surgery so that you can have these available when you are discharged from the hospital. The pain medication is for use ONLY  AFTER surgery and a new prescription will not be given.   -Make sure that you have Tylenol and Ibuprofen at home to use on a regular basis after surgery for pain control. We recommend alternating the medications every hour to six hours since they work differently and are processed in the body differently for pain relief.  -Review the attached handout on narcotic use and their risks and side effects.   Bowel Regimen -You have been prescribed Sennakot-S to take nightly to  prevent constipation especially if you are taking the narcotic pain medication intermittently.  It is important to prevent constipation and drink adequate amounts of liquids.  Blood Transfusion Information WHAT IS A BLOOD TRANSFUSION? A transfusion is the replacement of blood or some of its parts. Blood is made up of multiple cells which provide different functions.  Red blood cells carry oxygen and are used for blood loss replacement.  White blood cells fight against infection.  Platelets control bleeding.  Plasma helps clot blood.  Other blood products are available for specialized needs, such as hemophilia or other clotting disorders. BEFORE THE TRANSFUSION  Who gives blood for transfusions?   You may be able to donate blood to be used at a later date on yourself (autologous donation).  Relatives can be asked to donate blood. This is generally not any safer than if you have received blood from a stranger. The same precautions are taken to ensure safety when a relative's blood is donated.  Healthy volunteers who are fully evaluated to make sure their blood is safe. This is blood bank blood. Transfusion therapy is the safest it has ever been in the practice of medicine. Before blood is taken from a donor, a complete history is taken to make sure that person has no history of diseases nor engages in risky social behavior (examples are intravenous drug use or sexual activity with multiple partners). The donor's travel history is screened to minimize risk of transmitting infections, such as malaria. The donated blood is tested for signs of infectious diseases, such as HIV and hepatitis. The blood is then tested to be sure it is compatible with you in order to minimize the chance of a transfusion reaction. If you or a relative donates blood, this is often done in anticipation of surgery and is not appropriate for emergency situations. It takes many days to process the donated blood. RISKS AND  COMPLICATIONS Although transfusion therapy is very safe and saves many lives, the main dangers of transfusion include:   Getting an infectious disease.  Developing a transfusion reaction. This is an allergic reaction to something in the blood you were given. Every precaution is taken to prevent this. The decision to have a blood transfusion has been considered carefully by your caregiver before blood is given. Blood is not given unless the benefits outweigh the risks.  AFTER SURGERY INSTRUCTIONS  12/31/2018  Return to work: 4-6 weeks if applicable  Activity: 1. Be up and out of the bed during the day.  Take a nap if needed.  You may walk up steps but be careful and use the hand rail.  Stair climbing will tire you more than you think, you may need to stop part way and rest.   2. No lifting or straining for 6 weeks.  3. No driving for 1 week(s).  Do not drive if you are taking narcotic pain medicine.  4. Shower daily.  Use soap and water on your incision and pat dry; don't rub.  No tub baths until cleared by your surgeon.   5. IF YOU ELECT TO HAVE A HYSTERECTOMY: No sexual activity and nothing in the vagina for 8 weeks.  6. You may experience a small amount of clear drainage from your incisions, which is normal.  If the drainage persists or increases, please call the office.  7. IF YOU ELECT TO HAVE A HYSTERECTOMY: You may experience vaginal spotting after surgery or around the 6-8 week mark from surgery when the stitches at the top of the vagina begin to dissolve.  The spotting is normal but if you experience heavy bleeding, call our office.  8. Take Tylenol or ibuprofen first for pain and only use narcotic pain medication for severe pain not relieved by the Tylenol or Ibuprofen.  Monitor your Tylenol intake to a max of 4,000 mg.  Diet: 1. Low sodium Heart Healthy Diet is recommended.  2. It is safe to use a laxative, such as Miralax or Colace, if you have difficulty moving your  bowels. You can take Sennakot at bedtime every evening to keep bowel movements regular and to prevent constipation.    Wound Care: 1. Keep clean and dry.  Shower daily.  Reasons to call the Doctor:  Fever - Oral temperature greater than 100.4 degrees Fahrenheit  Foul-smelling vaginal discharge  Difficulty urinating  Nausea and vomiting  Increased pain at the site of the incision that is unrelieved with pain medicine.  Difficulty breathing with or without chest pain  New calf pain especially if only on one side  Sudden, continuing increased vaginal bleeding with or without clots.   Contacts: For questions or concerns you should contact:  Dr. Everitt Amber at [phone]  [name], NP at [phone]  After Hours: call [phone] and have the GYN Oncologist paged/contacted

## 2018-12-31 NOTE — Telephone Encounter (Signed)
I spoke with [name] this afternoon.  I instructed her that she should stop taking mobic 10 days before surgery.  She verbalized understanding.

## 2018-12-31 NOTE — Progress Notes (Signed)
[facility]    Progress Note : Established Patient FOLLOW-UP    Consult was originally requested by Dr. Jana Hakim  CC:  Chief Complaint  Patient presents with  . BRCA 2   Oncologic Summary: 1. BRCA2 mutation 2. 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 I discussed that we will first recheck CA 125 and TVUS to ensure there is no occult disease.  We reviewed whether or not hysterectomy should be done as part of the procedure. At present she is leaning towards BSO alone (less recovery and risk), though would like to consider hysterectomy still before the procedure. She will let us know if she elects to proceed with this. I explained that risk is slightly higher as is fatigue and discomfort, with hysterectomy, though overall risks and postop pain medication usage are fairly equivalent.  I explained that pelvic organ prolapse risk is the same regardless of hysterectomy or not (if she has no hysterectomy she still has a risk for prolapse in the future. I explained that hysterectomy may unearth/quicken the development of bladder symptoms, but not increase the overall incidence of them.  We reviewed the procedure and its risks  bleeding, infection, damage to internal organs (such as bladder,ureters, bowels), blood clot, reoperation and rehospitalization. I discussed postop restrictions and healing.   HPI: [name]  is a very nice 58 y.o.  P2  She has a personal history of breast cancer diagnosed in 2012.  In 2018 she lost her mother to breast cancer and coincidentally a cousin was diagnosed with breast cancer on her mother's side.  Therefore genetic testing 05/2017 was undertaken and the patient was found to have a BRCA2 pathogenic variant.  [facility]  testing  is reviewed and she had a c.[id number](p.Leu2092Profs*7) Pathogenic Variant.  In addition a VUS was found in [facility].  She has not had any screening GYN exams for some time including Pap.  She has not yet had transvaginal ultrasound or Ca125 for screening purposes.  She denies bloating, pelvic pain, changes in appetite, or unexpected weight loss.  Denies postmenopausal bleeding.  She does suffer from hot flashes but was restarted on anastrozole in 2019.  She does have a history of tamoxifen use for 2-1/2 years with her initial breast cancer treatment.  Interval Hx:  [place] on 07/21/18 showed a normal uterus measuring 7.7x3.1x4.1cm with a 4.32m thickness, normal ovaries bilaterally. CA 125 on 07/18/27 was normal at 31.6.  CT restaging for her breast cancer in December, 2019 showed normal gynecologic organs and no recurrent disease.   Measurement of disease:  No results for input(s): CA125, CAN125, CEA, CA199, ESTRADIOL, INHBB in the last 8760 hours.  Invalid input(s): INHIBINA  . Pending further work-up   Radiology: . Pending further work-up    Oncologic History: See chart for her breast cancer history essentially she was treated with lumpectomy followed by radiation and chemotherapy with treatment completing in 2012.  [place] by tamoxifen for 2-1/2 years with the last dose of tamoxifen being February 2018.  She was restarted on anastrozole approximately April 2019.   Oncology History  Malignant neoplasm of upper-outer quadrant of right breast in female, estrogen receptor positive [place]  04/10/2017 Initial Diagnosis   Malignant neoplasm of upper-outer  quadrant of right breast in female, estrogen receptor positive ([place])   06/03/2017 Genetic Testing   The Common Hereditary Cancers Panel + Myelodysplastic syndrome/Leukemia Panel was ordered from [facility] laboratories. The following genes were evaluated for sequence changes and  exonic deletions/duplications: APC, ATM, AXIN2, BARD1, BLM, BMPR1A, BRCA1, BRCA2, BRIP1, CDH1, CDK4, CDKN2A (p14ARF), CDKN2A (p16INK4a), CEBPA, CHEK2, CTNNA1, DICER1, EPCAM*, GATA2, GREM1*, HRAS, KIT, MEN1, MLH1, MSH2, MSH3, MSH6, MUTYH, NBN, NF1, PALB2, PDGFRA, PMS2, POLD1, POLE, PTEN, RAD50, RAD51C, RAD51D, RUNX1, SDHB, SDHC, SDHD, SMAD4, SMARCA4, STK11, TERC, TERT, TP53, TSC1, TSC2, VHL. The following genes were evaluated for sequence changes only:HOXB13*, NTHL1*, SDHA.  Results: Positive: Pathnogeic variant in BRCA2 c.[id number] (p.Leu2092Profs*7). A variant of uncertain significance in the gene MSH3 was also identified c.2695A>G (p.Met899Val).      Current Meds:  Outpatient Encounter Medications as of 12/31/2018  Medication Sig  . alendronate (FOSAMAX) 35 MG tablet Take 1 tablet (35 mg total) by mouth every Wednesday. Take with a full glass of water on an empty stomach.  Marland Kitchen anastrozole (ARIMIDEX) 1 MG tablet Take 1 tablet (1 mg total) by mouth daily.  . chlorpheniramine (CHLOR-TRIMETON) 4 MG tablet Take 4 mg by mouth daily as needed for allergies.   . citalopram (CELEXA) 40 MG tablet Take 1 tablet (40 mg total) by mouth daily.  . Cyanocobalamin (VITAMIN B-12) 1000 MCG SUBL Place 1,000 mcg under the tongue daily.  Marland Kitchen gabapentin (NEURONTIN) 300 MG capsule Take 1 capsule (300 mg total) by mouth 3 (three) times daily.  Marland Kitchen levothyroxine (SYNTHROID, LEVOTHROID) 88 MCG tablet Take 88 mcg by mouth daily before breakfast.  . meloxicam (MOBIC) 7.5 MG tablet Take 2 tablets (15 mg total) by mouth daily.  . mometasone (NASONEX) 50 MCG/ACT nasal spray Place 2 sprays into the nose at bedtime.  . Multiple Vitamin (MULTIVITAMIN) capsule Take 1 capsule by mouth 2 (two) times a week.   . pravastatin (PRAVACHOL) 40 MG tablet Take 1 tablet (40 mg total) by mouth at bedtime.   No facility-administered encounter medications on file as of 12/31/2018.     We did discuss holding fish oil and meloxicam prior to  surgery.  She states she is not regularly taking aspirin but knows to hold that prior to surgery  Allergy: No Known Allergies  Social Hx:   Social History   Socioeconomic History  . Marital status: Married    Spouse name: Not on file  . Number of children: Not on file  . Years of education: Not on file  . Highest education level: Not on file  Occupational History  . Not on file  Social Needs  . Financial resource strain: Not on file  . Food insecurity    Worry: Not on file    Inability: Not on file  . Transportation needs    Medical: Not on file    Non-medical: Not on file  Tobacco Use  . Smoking status: Never Smoker  . Smokeless tobacco: Never Used  Substance and Sexual Activity  . Alcohol use: Yes    Comment: <10 / year, holidays only  . Drug use: No  . Sexual activity: Not on file  Lifestyle  . Physical activity    Days per week: Not on file    Minutes per session: Not on file  . Stress: Not on file  Relationships  . Social Herbalist on phone: Not on file    Gets together: Not on file    Attends religious service: Not  on file    Active member of club or organization: Not on file    Attends meetings of clubs or organizations: Not on file    Relationship status: Not on file  . Intimate partner violence    Fear of current or ex partner: Not on file    Emotionally abused: Not on file    Physically abused: Not on file    Forced sexual activity: Not on file  Other Topics Concern  . Not on file  Social History Narrative  . Not on file    Past Surgical Hx:  Past Surgical History:  Procedure Laterality Date  . BREAST LUMPECTOMY WITH AXILLARY LYMPH NODE BIOPSY Right 07/08/2010  . MANDIBLE SURGERY  1991   due to accident as a child. sx to straighten dental bite.  . port a cath insertion    . PORT-A-CATH REMOVAL  12/28/2010   power port per patient  . RADIOACTIVE SEED GUIDED AXILLARY SENTINEL LYMPH NODE Left 03/02/2018   Procedure: SEED TARGETED DEEP  LEFT AXILLARY LYMPH NODE BIOPSY;  Surgeon: [name], MD;  Location: [facility];  Service: General;  Laterality: Left;    Past Medical Hx:  Past Medical History:  Diagnosis Date  . Anxiety   . Arthritis   . Breast cancer, right ([place]) 2012  . Chigger bite 12/24/2017  . De Quervain's syndrome (tenosynovitis)    Left wrist  . Depression   . Family history of BRCA2 gene positive   . Family history of cancer   . Fibromyalgia   . History of radiation therapy 02/2011   R breast  . History of TMJ syndrome   . Hyperlipidemia   . Hypothyroid   . Left ankle injury 12/24/2017   swollen  . Obese   . Osteopenia 08/09/2011  . 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 Personal history of chemotherapy   . Personal history of radiation therapy   . Staph infection    in sore on vagina a few years ago, "She said it was MRSA" but no tests were done    Past Gynecological History:   GYNECOLOGIC HISTORY:  No LMP recorded. Patient is postmenopausal. Menarche: 58 years old P 2 LMP 58 yo Contraceptive OCP and Mirena x ~10 years HRT vaginal estrogen, no oral HRT  Last Pap 07/2017 - negative with neg HPV  Family Hx:  Family History  Problem Relation Age of Onset  . Cancer Maternal Aunt 75       colon - great maternal aunt  . Cancer Paternal Aunt 19       gyn- unsure if ovarian or uterine- met to lung  . Breast cancer Maternal Aunt 81  . Breast cancer Mother 67  . Prostate cancer Paternal Uncle        dx >50  . Heart disease Maternal Grandmother 80  . Pneumonia Maternal Grandfather 53  . Lung cancer Paternal Grandmother 66  . Leukemia Paternal Grandfather 73  . Pancreatic cancer Paternal Aunt 27  . Bladder Cancer Paternal Aunt 52  . Lung cancer Paternal Aunt   . Other Cousin        BRCA2 +  . Breast cancer Cousin        dx <50  . Colon cancer Neg Hx     Review of Systems:  Review of Systems  Constitutional: Negative.   HENT:  Negative.   Eyes: Negative.   Respiratory:  Negative.   Cardiovascular: Negative.   Gastrointestinal: Negative.  Endocrine: Negative.   Genitourinary: Positive for frequency.   Musculoskeletal: Negative.   Skin: Negative.   Neurological: Negative.   Hematological: Negative.   Psychiatric/Behavioral: Negative.   notes joint stiffness  Vitals:  Blood pressure 140/87, pulse 92, temperature 98.2 F (36.8 C), temperature source Temporal, resp. rate 18, height _0  (1.651 m), weight 207 lb 4 oz (94 kg), SpO2 99 %. Body mass index is 34.49 kg/m.   Physical Exam: ECOG PERFORMANCE STATUS: 0 - Asymptomatic   General :  Well developed, 58 y.o., female in no apparent distress HEENT:  Normocephalic/atraumatic, symmetric, EOMI, eyelids normal Neck:   Supple, no masses.  Lymphatics:  No cervical/ submandibular/ supraclavicular/ infraclavicular/ inguinal adenopathy Respiratory:  Respirations unlabored, no use of accessory muscles CV:   Deferred Breast:  Deferred Musculoskeletal: No CVA tenderness, normal muscle strength. Abdomen:  Soft, non-tender and nondistended. No evidence of hernia. No masses. Extremities:  No lymphedema, no erythema, non-tender. Skin:   Normal inspection Neuro/Psych:  No focal motor deficit, no abnormal mental status. Normal gait. Normal affect. Alert and oriented to person, place, and time  Genito Urinary 07/17/2017: Vulva: Normal external female genitalia.  Bladder/urethra: Urethral meatus normal in size and location. No lesions or   masses, well supported bladder Speculum exam: Some mild pelvic relaxation Vagina: No lesion, no discharge, no bleeding. Cervix: Normal appearing, no lesions. Bimanual exam:  Uterus: Normal size, mobile.  Adnexa: No masses.  What feels to be a palpably normal left ovary ; right adnexa not definable but no masses Rectovaginal:  Good tone, no masses, no cul de sac nodularity, no parametrial involvement or nodularity.  Thereasa Solo, MD  12/31/2018, 10:21 AM  Cc: Lurline Del MD (Referring oncologist) Dr. Marda Stalker (PCP)

## 2018-12-31 NOTE — Addendum Note (Signed)
Addended by: [name] D on: 12/31/2018 10:36 AM   Modules accepted: Orders

## 2019-01-03 NOTE — Progress Notes (Signed)
ID: [name]   DOB: [birth year]  MR#: [id number]  KNL#:[id number]   Patient Care Team: Cordial, Launa Grill, MD as PCP - General Tyeesha Riker, Virgie Dad, MD as Consulting Physician (Hematology and Oncology) [name], MD as Consulting Physician (General Surgery) [name], MD as Consulting Physician (Radiation Oncology) Vania Rea, MD as Consulting Physician (Obstetrics and Gynecology) Dorothyann Gibbs, NP as Nurse Practitioner (Gynecologic Oncology) Maisie Fus, MD as Consulting Physician (Obstetrics and Gynecology) OTHER MD:   CHIEF COMPLAINT:  Right Breast Cancer with BRCA2 mutation; follicular lymphoma grade 3A  CURRENT TREATMENT: anastrozole   INTERVAL HISTORY:  [name] returns today for follow-up and treatment of her breast cancer.    She continues on anastrozole, with good tolerance.  Hot flashes vaginal dryness and arthralgias and myalgias are not major issues for her.  As far as her follicular lymphoma is concerned, she has not noted any new lumps or bumps.  She has had no drenching sweats, unexplained fatigue, unexplained weight loss, or fevers.  Since her last visit, she met with Dr. Denman George on 12/31/2018 to further discuss bilateral salpingo-oophorectomy. She is still deciding between BSO alone or hysterectomy with BSO. Dr Denman George will have her undergo transvaginal ultrasound and have her CA-125 rechecked prior to being scheduled for surgery. She is scheduled to undergo TVUS today.  Her most recent breast imaging was bilateral breast MRI on 01/29/2018.    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  Otherwise they are extremely careful regarding the pandemic.  Marks brother-in-law and sister did get the virus.  They have survived but it has been difficult for them.  There has been no direct exposure though of [name] or Mark.  The rest of the family is being equally careful.  A detailed review of systems today  was otherwise stable.   BREAST CANCER HISTORY: From the original intake note:  [name] had screening mammography 06/09/2010 at the breast center.  This suggested a possible abnormality in the right breast.  She was brought back for additional views on 06/16/2010.  Dr. [name] was able to palpate a firm mobile mass at the 9 o'clock position 7 cm from the nipple.  Compression views confirmed an ill-defined mass with punctate and linear calcifications.  Ultrasound found this to be hypoechoic and to measure 1 cm.  In addition there was a lymph node with a diffusely thickened cortex measuring 1.5 cm in the right axilla.  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  With this information the patient was set up for breast MRI on 06/25/2010.  This showed in the posterior third of the upper outer quadrant of the right breast an irregular mass measuring 1.8 cm.  There was no multifocal or multicentric disease and there were at least two right axillary lymph nodes with thickened cortices.  There was no left axillary or left breast mass, no internal mammary chain adenopathy.  LYMPHOMA HISTORY: She underwent bilateral breast MRI on 01/29/2018, which showed breast density category B and enlarged left axillary adenopathy.  She then underwent left axillary lymph node biopsy on 03/02/2018.  Cytometry [phone]) from the procedure showed monoclonal B-cell population expressing CD10 consistent with non-Hodgkin B-cell lymphoma. Pathology [id number]) report specifies a follicular lymphoma, grade 3a.   She proceeded to definitive therapy as detailed below.   PAST MEDICAL HISTORY: Past  Medical History:  Diagnosis Date  . Anxiety   . Arthritis   . Breast cancer, right ([place]) 2012  . Chigger bite 12/24/2017  . De Quervain's syndrome (tenosynovitis)    Left wrist  . Depression   . Family history of BRCA2 gene positive   . Family history of cancer   . Fibromyalgia   . History of radiation therapy 02/2011   R breast  . History of TMJ syndrome   . Hyperlipidemia   . Hypothyroid   . Left ankle injury 12/24/2017   swollen  . Obese   . Osteopenia 08/09/2011  . 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 Personal history of chemotherapy   . Personal history of radiation therapy   . Staph infection    in sore on vagina a few years ago, "She said it was MRSA" but no tests were done    PAST SURGICAL HISTORY: Past Surgical History:  Procedure Laterality Date  . BREAST LUMPECTOMY WITH AXILLARY LYMPH NODE BIOPSY Right 07/08/2010  . MANDIBLE SURGERY  1991   due to accident as a child. sx to straighten dental bite.  . port a cath insertion    . PORT-A-CATH REMOVAL  12/28/2010   power port per patient  . RADIOACTIVE SEED GUIDED AXILLARY SENTINEL LYMPH NODE Left 03/02/2018   Procedure: SEED TARGETED DEEP LEFT AXILLARY LYMPH NODE BIOPSY;  Surgeon: [name], MD;  Location: [facility];  Service: General;  Laterality: Left;    FAMILY HISTORY Family History  Problem Relation Age of Onset  . Cancer Maternal Aunt 75       colon - great maternal aunt  . Cancer Paternal Aunt 96       gyn- unsure if ovarian or uterine- met to lung  . Breast cancer Maternal Aunt 41  . Breast cancer Mother 61  . Prostate cancer Paternal Uncle        dx >50  . Heart disease Maternal Grandmother 80  . Pneumonia Maternal Grandfather 16  . Lung cancer Paternal Grandmother 36  . Leukemia Paternal Grandfather 61  . Pancreatic cancer Paternal Aunt 40  . Bladder Cancer Paternal Aunt 48  . Lung cancer Paternal Aunt   . Other Cousin        BRCA2 +  . Breast cancer Cousin        dx <50  .  Colon cancer Neg Hx   The patient's father died 05/02/15 apparently following multiple strokes. Patient's mother is passed away 08/30/15, two weeks after being diagnosed with breast cancer, but she notes that her mother knew about a lump in her breast for 2 years and did not bring this to medical attention. She has no sisters, and had one brother, who is now deceased. She has a maternal aunt who was diagnosed with breast cancer before the age of 61 and a paternal aunt who was diagnosed with ovarian cancer before the age of 75.   GYNECOLOGIC HISTORY: Menarche age 89, menopause about five to seven years ago.  She has been on hormone replacement for various periods most recently started estradiol June 10, 2010.  She never had any complications from any of the hormones she took and she has a Mirena IUD in  place.   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  The [employer] brings them the hens, they gather the eggs and then [employer] sets the eggs up to hatch.  As of October 2020 she is "retiring" from the [employer].  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 They have two daughters "[name]" Mashelle Busick, 30, who works in [place], but lives at home and [name] "Katie" Brock, 25, who has graduated from [employer].  Joellen Jersey is my patient, with a positive BRCA2 mutation.  [name] notes that her mother passed away in Sep 06, 2016, two weeks after being diagnosed with breast cancer.    ADVANCED DIRECTIVES: not in place   HEALTH MAINTENANCE: (updated 01/17/2013) Social History   Tobacco Use  . Smoking status: Never Smoker  . Smokeless tobacco: Never Used  Substance Use Topics  . Alcohol use: Yes    Comment: <10 / year, holidays only  . Drug use: No     Colonoscopy: Never  PAP:  Bone density:  Osteopenia, May 2013  Lipid panel: Dr. Charlean Merl    No Known Allergies  Current  Outpatient Medications  Medication Sig Dispense Refill  . alendronate (FOSAMAX) 35 MG tablet Take 1 tablet (35 mg total) by mouth every Wednesday. Take with a full glass of water on an empty stomach. 90 tablet 0  . anastrozole (ARIMIDEX) 1 MG tablet Take 1 tablet (1 mg total) by mouth daily. 90 tablet 4  . chlorpheniramine (CHLOR-TRIMETON) 4 MG tablet Take 4 mg by mouth daily as needed for allergies.  14 tablet 0  . citalopram (CELEXA) 40 MG tablet Take 1 tablet (40 mg total) by mouth daily. 90 tablet 4  . Cyanocobalamin (VITAMIN B-12) 1000 MCG SUBL Place 1,000 mcg under the tongue daily.    Marland Kitchen gabapentin (NEURONTIN) 300 MG capsule Take 1 capsule (300 mg total) by mouth 3 (three) times daily. 90 capsule 3  . ibuprofen (ADVIL) 800 MG tablet Take 1 tablet (800 mg total) by mouth every 8 (eight) hours as needed for moderate pain. For AFTER surgery only 30 tablet 0  . levothyroxine (SYNTHROID, LEVOTHROID) 88 MCG tablet Take 88 mcg by mouth daily before breakfast.    . meloxicam (MOBIC) 7.5 MG tablet Take 2 tablets (15 mg total) by mouth daily. 60 tablet 6  . mometasone (NASONEX) 50 MCG/ACT nasal spray Place 2 sprays into the nose at bedtime. 17 g 1  . Multiple Vitamin (MULTIVITAMIN) capsule Take 1 capsule by mouth 2 (two) times a week.     Marland Kitchen oxyCODONE (OXY IR/ROXICODONE) 5 MG immediate release tablet Take 1 tablet (5 mg total) by mouth every 4 (four) hours as needed for severe pain. For AFTER surgery only, do not take and drive 10 tablet 0  . pravastatin (PRAVACHOL) 40 MG tablet Take 1 tablet (40 mg total) by mouth at bedtime. 90 tablet 4  . senna-docusate (SENOKOT-S) 8.6-50 MG tablet Take 2 tablets by mouth at bedtime. For AFTER surgery, do not take if having diarrhea 30 tablet 0   No current facility-administered medications for this visit.     OBJECTIVE: Middle-aged white woman who appears well  Vitals:   01/04/19 0901  BP: 132/79  Pulse: 87  Resp: 18  Temp: 98 F (36.7 C)  SpO2: 97%      Body mass index is 34.55 kg/m.    ECOG FS: 1  Filed Weights   01/04/19 0901  Weight: 207 lb 9.6 oz (94.2 kg)    Sclerae unicteric, EOMs intact Wearing a mask No cervical or supraclavicular adenopathy, no axillary or inguinal adenopathy Lungs no rales or rhonchi Heart regular rate and rhythm Abd soft, nontender, positive bowel sounds MSK no focal spinal tenderness, no upper extremity lymphedema Neuro: nonfocal, well oriented, appropriate affect Breasts: No masses palpated in either breast.  The right breast is status post remote lumpectomy and radiation.   LAB RESULTS: Lab Results  Component Value Date   WBC 6.9 01/04/2019   NEUTROABS 4.3 01/04/2019   HGB 14.6 01/04/2019   HCT 43.7 01/04/2019   MCV 90.1 01/04/2019   PLT 165 01/04/2019      Chemistry      Component Value Date/Time   NA 139 01/04/2019 0842   NA 142 08/05/2014 0818   K 3.8 01/04/2019 0842   K 3.8 08/05/2014 0818   CL 107 01/04/2019 0842   CL 105 07/09/2012 0808   CO2 21 (L) 01/04/2019 0842   CO2 23 08/05/2014 0818   BUN 12 01/04/2019 0842   BUN 10.7 08/05/2014 0818   CREATININE 0.83 01/04/2019 0842   CREATININE 0.77 03/15/2018 1332   CREATININE 0.8 08/05/2014 0818      Component Value Date/Time   CALCIUM 9.2 01/04/2019 0842   CALCIUM 8.5 08/05/2014 0818   ALKPHOS 74 01/04/2019 0842   ALKPHOS 62 08/05/2014 0818   AST 16 01/04/2019 0842   AST 22 03/15/2018 1332   AST 22 08/05/2014 0818   ALT 15 01/04/2019 0842   ALT 23 03/15/2018 1332   ALT 20 08/05/2014 0818   BILITOT 0.7 01/04/2019 0842   BILITOT 0.5 03/15/2018 1332   BILITOT 0.59 08/05/2014 0818     Beta-2 microglobulin is normal.  LDH is minimally elevated, and no more than 4 months ago  STUDIES: No results found.   ASSESSMENT: 58 y.o. BRCA2 positive [place] woman   (1)  status post right lumpectomy and axillary lymph node dissection April 2012 for a T1c N1 M0, stage IIA invasive ductal carcinoma, grade 3, strongly estrogen and  progesterone receptor positive, HER-2 negative with an MIB-1 of 26%,   (2)  treated adjuvantly with 4 cycles of dose dense doxorubicin and cyclophosphamide followed by 9 weekly doses of paclitaxel   (3)  radiation completed December of 2012.   (4)  She started letrozole 03/15/2011, switched to tamoxifen as of January 2016  (a) completed 5 years of antiestrogens February 2018  (b) anastrozole started April 2019  (5) Osteopenia, started alendronate (Fosamax) May 2013  (6) genetics testing 05/11/2017 through the Common Hereditary Cancers Panel + Myelodysplastic syndrome/Leukemia Panel was ordered from [facility] found a Positive: Pathogeic variant in BRCA2 c.[id number] (p.Leu2092Profs*7).   (a) there were no pathogenic mutations noted in APC, ATM, AXIN2, BARD1, BLM, BMPR1A, BRCA1, BRIP1, CDH1, CDK4, CDKN2A (p14ARF), CDKN2A (p16INK4a), CEBPA, CHEK2, CTNNA1, DICER1, EPCAM*, GATA2, GREM1*, HRAS, KIT, MEN1, MLH1, MSH2, MSH3, MSH6, MUTYH, NBN, NF1, PALB2, PDGFRA, PMS2, POLD1, POLE, PTEN, RAD50, RAD51C, RAD51D, RUNX1, SDHB, SDHC, SDHD, SMAD4, SMARCA4, STK11, TERC, TERT, TP53, TSC1, TSC2, VHL. The following genes were evaluated for sequence changes only:HOXB13*, NTHL1*, SDHA.  (b) A variant of uncertain significance in the gene MSH3 was also identified c.2695A>G (p.Met899Val).   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  CD5 negative  (b) CT scans of the chest abdomen and pelvis 02/21/2018 shows measurable  adenopathy both above and below the diaphragm (largest lesions 1.5 cm left axilla, 1.3 cm left inguinal area).  (c) FLIPI2 at diagnosis: <60 y/o, Hb >12, largest node <6.0 cm; beta-2-microglobulin 1.8 (WNL); bone marrow not obtained  (d) HIV and hepatitis B studies 03/15/2018 negative  (11) DEXA scan 07/31/2013 showed a T score of -1.8   PLAN:  Mataya is 8-1/2 years out from definitive surgery for her breast cancer with no evidence of disease recurrence.  This is very favorable.  She continues on anastrozole and alendronate.  She is tolerating these well.  At the next visit we will consider going off the anastrozole.  We will obtain a bone density with her next mammogram in May.  As far as her BRCA2 positivity, she is expecting to have bilateral salpingo-oophorectomy under Dr. Denman George 02/25/2019.  We are continuing intensified screening with breast MRI this November and mammogram next May.  She will see me shortly after the mammography  As far as the follicular lymphoma is concerned there is no indication for treatment at this point.  She knows to call for any other issue that may develop before the next visit.  Treyshawn Muldrew, Virgie Dad, MD  01/04/19 9:28 AM Medical Oncology and Hematology [facility] [address] [place], [place] [address] Tel. [phone]    Fax. [phone]    I, Wilburn Mylar am acting as scribe for Dr. Virgie Dad. Rachelann Enloe.  I, Lurline Del MD, have reviewed the above documentation for accuracy and completeness, and I agree with the above.

## 2019-01-04 ENCOUNTER — Encounter: Payer: Self-pay | Admitting: Gynecologic Oncology

## 2019-01-04 ENCOUNTER — Inpatient Hospital Stay: Payer: BLUE CROSS/BLUE SHIELD | Admitting: Oncology

## 2019-01-04 ENCOUNTER — Inpatient Hospital Stay: Payer: BLUE CROSS/BLUE SHIELD

## 2019-01-04 ENCOUNTER — Other Ambulatory Visit: Payer: Self-pay | Admitting: Gynecologic Oncology

## 2019-01-04 ENCOUNTER — Other Ambulatory Visit: Payer: Self-pay

## 2019-01-04 ENCOUNTER — Ambulatory Visit (HOSPITAL_COMMUNITY)
Admission: RE | Admit: 2019-01-04 | Discharge: 2019-01-04 | Disposition: A | Discharge status: Home / Self Care | Payer: BLUE CROSS/BLUE SHIELD | Source: Ambulatory Visit | Attending: Gynecologic Oncology | Admitting: Gynecologic Oncology

## 2019-01-04 VITALS — BP 132/79 | HR 87 | Temp 98.0°F | Resp 18 | Ht 65.0 in | Wt 207.6 lb

## 2019-01-04 DIAGNOSIS — C8234 Follicular lymphoma grade IIIa, lymph nodes of axilla and upper limb: Secondary | ICD-10-CM | POA: Diagnosis not present

## 2019-01-04 DIAGNOSIS — Z1509 Genetic susceptibility to other malignant neoplasm: Secondary | ICD-10-CM | POA: Diagnosis present

## 2019-01-04 DIAGNOSIS — C50911 Malignant neoplasm of unspecified site of right female breast: Secondary | ICD-10-CM | POA: Diagnosis not present

## 2019-01-04 DIAGNOSIS — Z1501 Genetic susceptibility to malignant neoplasm of breast: Secondary | ICD-10-CM

## 2019-01-04 DIAGNOSIS — Z170 Estrogen receptor positive status [ER+]: Secondary | ICD-10-CM

## 2019-01-04 DIAGNOSIS — C50411 Malignant neoplasm of upper-outer quadrant of right female breast: Secondary | ICD-10-CM

## 2019-01-04 LAB — CBC WITH DIFFERENTIAL/PLATELET
Abs Immature Granulocytes: 0.02 10*3/uL (ref 0.00–0.07)
Basophils Absolute: 0.1 10*3/uL (ref 0.0–0.1)
Basophils Relative: 1 %
Eosinophils Absolute: 0.4 10*3/uL (ref 0.0–0.5)
Eosinophils Relative: 5 %
HCT: 43.7 % (ref 36.0–46.0)
Hemoglobin: 14.6 g/dL (ref 12.0–15.0)
Immature Granulocytes: 0 %
Lymphocytes Relative: 21 %
Lymphs Abs: 1.4 10*3/uL (ref 0.7–4.0)
MCH: 30.1 pg (ref 26.0–34.0)
MCHC: 33.4 g/dL (ref 30.0–36.0)
MCV: 90.1 fL (ref 80.0–100.0)
Monocytes Absolute: 0.7 10*3/uL (ref 0.1–1.0)
Monocytes Relative: 10 %
Neutro Abs: 4.3 10*3/uL (ref 1.7–7.7)
Neutrophils Relative %: 63 %
Platelets: 165 10*3/uL (ref 150–400)
RBC: 4.85 MIL/uL (ref 3.87–5.11)
RDW: 12.5 % (ref 11.5–15.5)
WBC: 6.9 10*3/uL (ref 4.0–10.5)
nRBC: 0 % (ref 0.0–0.2)

## 2019-01-04 LAB — COMPREHENSIVE METABOLIC PANEL
ALT: 15 U/L (ref 0–44)
AST: 16 U/L (ref 15–41)
Albumin: 4 g/dL (ref 3.5–5.0)
Alkaline Phosphatase: 74 U/L (ref 38–126)
Anion gap: 11 (ref 5–15)
BUN: 12 mg/dL (ref 6–20)
CO2: 21 mmol/L — ABNORMAL LOW (ref 22–32)
Calcium: 9.2 mg/dL (ref 8.9–10.3)
Chloride: 107 mmol/L (ref 98–111)
Creatinine, Ser: 0.83 mg/dL (ref 0.44–1.00)
GFR calc Af Amer: >60 mL/min (ref >60)
GFR calc non Af Amer: >60 mL/min (ref >60)
Glucose, Bld: 103 mg/dL — ABNORMAL HIGH (ref 70–99)
Potassium: 3.8 mmol/L (ref 3.5–5.1)
Sodium: 139 mmol/L (ref 135–145)
Total Bilirubin: 0.7 mg/dL (ref 0.3–1.2)
Total Protein: 7.2 g/dL (ref 6.5–8.1)

## 2019-01-04 LAB — LACTATE DEHYDROGENASE: LDH: 193 U/L — ABNORMAL HIGH (ref 98–192)

## 2019-01-04 NOTE — Progress Notes (Signed)
Patient stopped by the office stating she has decided for surgery on Dec 14 and only wants to have her ovaries removed (no hyst).

## 2019-01-05 LAB — BETA 2 MICROGLOBULIN, SERUM: Beta-2 Microglobulin: 1.4 mg/L (ref 0.6–2.4)

## 2019-01-05 LAB — CA 125: Cancer Antigen (CA) 125: 31.6 U/mL (ref 0.0–38.1)

## 2019-01-07 ENCOUNTER — Telehealth: Payer: Self-pay | Admitting: Oncology

## 2019-01-07 ENCOUNTER — Telehealth: Payer: Self-pay

## 2019-01-07 NOTE — Telephone Encounter (Signed)
-----   Message from Dorothyann Gibbs, NP sent at 01/07/2019 11:28 AM EDT ----- Can you let her know

## 2019-01-07 NOTE — Telephone Encounter (Signed)
I could not reach patient regarding schedule for May

## 2019-01-07 NOTE — Telephone Encounter (Signed)
LM stating that the CA-125 was WNL at 31.6. Pt can call back to the office if she has any questions.

## 2019-01-09 ENCOUNTER — Telehealth: Payer: Self-pay

## 2019-01-09 NOTE — Telephone Encounter (Signed)
I spoke with [name] this afternoon.  I let her know that her ultrasound was normal. She verbalized understanding

## 2019-02-21 ENCOUNTER — Encounter (HOSPITAL_COMMUNITY)
Admission: RE | Admit: 2019-02-21 | Discharge: 2019-02-21 | Disposition: A | Discharge status: Home / Self Care | Payer: BLUE CROSS/BLUE SHIELD | Source: Ambulatory Visit | Attending: Gynecologic Oncology | Admitting: Gynecologic Oncology

## 2019-02-21 ENCOUNTER — Other Ambulatory Visit (HOSPITAL_COMMUNITY)
Admission: RE | Admit: 2019-02-21 | Discharge: 2019-02-21 | Disposition: A | Discharge status: Home / Self Care | Payer: BLUE CROSS/BLUE SHIELD | Source: Ambulatory Visit | Attending: Gynecologic Oncology | Admitting: Gynecologic Oncology

## 2019-02-21 ENCOUNTER — Other Ambulatory Visit: Payer: Self-pay

## 2019-02-21 ENCOUNTER — Encounter (HOSPITAL_BASED_OUTPATIENT_CLINIC_OR_DEPARTMENT_OTHER): Payer: Self-pay | Admitting: Gynecologic Oncology

## 2019-02-21 DIAGNOSIS — Z8042 Family history of malignant neoplasm of prostate: Secondary | ICD-10-CM | POA: Diagnosis not present

## 2019-02-21 DIAGNOSIS — Z1501 Genetic susceptibility to malignant neoplasm of breast: Secondary | ICD-10-CM | POA: Diagnosis not present

## 2019-02-21 DIAGNOSIS — C50411 Malignant neoplasm of upper-outer quadrant of right female breast: Secondary | ICD-10-CM | POA: Diagnosis not present

## 2019-02-21 DIAGNOSIS — Z79899 Other long term (current) drug therapy: Secondary | ICD-10-CM | POA: Diagnosis not present

## 2019-02-21 DIAGNOSIS — E039 Hypothyroidism, unspecified: Secondary | ICD-10-CM | POA: Diagnosis not present

## 2019-02-21 DIAGNOSIS — E669 Obesity, unspecified: Secondary | ICD-10-CM | POA: Diagnosis not present

## 2019-02-21 DIAGNOSIS — Z8052 Family history of malignant neoplasm of bladder: Secondary | ICD-10-CM | POA: Diagnosis not present

## 2019-02-21 DIAGNOSIS — F419 Anxiety disorder, unspecified: Secondary | ICD-10-CM | POA: Diagnosis not present

## 2019-02-21 DIAGNOSIS — Z20828 Contact with and (suspected) exposure to other viral communicable diseases: Secondary | ICD-10-CM | POA: Insufficient documentation

## 2019-02-21 DIAGNOSIS — M797 Fibromyalgia: Secondary | ICD-10-CM | POA: Diagnosis not present

## 2019-02-21 DIAGNOSIS — C50911 Malignant neoplasm of unspecified site of right female breast: Secondary | ICD-10-CM | POA: Insufficient documentation

## 2019-02-21 DIAGNOSIS — Z6835 Body mass index (BMI) 35.0-35.9, adult: Secondary | ICD-10-CM | POA: Diagnosis not present

## 2019-02-21 DIAGNOSIS — Z8249 Family history of ischemic heart disease and other diseases of the circulatory system: Secondary | ICD-10-CM | POA: Diagnosis not present

## 2019-02-21 DIAGNOSIS — Z1509 Genetic susceptibility to other malignant neoplasm: Secondary | ICD-10-CM | POA: Diagnosis not present

## 2019-02-21 DIAGNOSIS — Z7951 Long term (current) use of inhaled steroids: Secondary | ICD-10-CM | POA: Diagnosis not present

## 2019-02-21 DIAGNOSIS — Z853 Personal history of malignant neoplasm of breast: Secondary | ICD-10-CM | POA: Diagnosis not present

## 2019-02-21 DIAGNOSIS — Z803 Family history of malignant neoplasm of breast: Secondary | ICD-10-CM | POA: Diagnosis not present

## 2019-02-21 DIAGNOSIS — Z01812 Encounter for preprocedural laboratory examination: Secondary | ICD-10-CM | POA: Diagnosis present

## 2019-02-21 DIAGNOSIS — Z8572 Personal history of non-Hodgkin lymphomas: Secondary | ICD-10-CM | POA: Diagnosis not present

## 2019-02-21 DIAGNOSIS — Z170 Estrogen receptor positive status [ER+]: Secondary | ICD-10-CM | POA: Diagnosis not present

## 2019-02-21 DIAGNOSIS — M8580 Other specified disorders of bone density and structure, unspecified site: Secondary | ICD-10-CM | POA: Diagnosis not present

## 2019-02-21 DIAGNOSIS — Z791 Long term (current) use of non-steroidal anti-inflammatories (NSAID): Secondary | ICD-10-CM | POA: Diagnosis not present

## 2019-02-21 DIAGNOSIS — F329 Major depressive disorder, single episode, unspecified: Secondary | ICD-10-CM | POA: Diagnosis not present

## 2019-02-21 DIAGNOSIS — E785 Hyperlipidemia, unspecified: Secondary | ICD-10-CM | POA: Diagnosis not present

## 2019-02-21 DIAGNOSIS — Z800 Family history of malignant neoplasm of digestive organs: Secondary | ICD-10-CM | POA: Diagnosis not present

## 2019-02-21 DIAGNOSIS — Z801 Family history of malignant neoplasm of trachea, bronchus and lung: Secondary | ICD-10-CM | POA: Diagnosis not present

## 2019-02-21 DIAGNOSIS — M1990 Unspecified osteoarthritis, unspecified site: Secondary | ICD-10-CM | POA: Diagnosis not present

## 2019-02-21 DIAGNOSIS — Z9221 Personal history of antineoplastic chemotherapy: Secondary | ICD-10-CM | POA: Diagnosis not present

## 2019-02-21 LAB — CBC
HCT: 43.6 % (ref 36.0–46.0)
Hemoglobin: 14.2 g/dL (ref 12.0–15.0)
MCH: 29.5 pg (ref 26.0–34.0)
MCHC: 32.6 g/dL (ref 30.0–36.0)
MCV: 90.6 fL (ref 80.0–100.0)
Platelets: 164 10*3/uL (ref 150–400)
RBC: 4.81 MIL/uL (ref 3.87–5.11)
RDW: 13 % (ref 11.5–15.5)
WBC: 6.3 10*3/uL (ref 4.0–10.5)
nRBC: 0 % (ref 0.0–0.2)

## 2019-02-21 LAB — URINALYSIS, ROUTINE W REFLEX MICROSCOPIC
Bacteria, UA: NONE SEEN
Bilirubin Urine: NEGATIVE
Glucose, UA: NEGATIVE mg/dL
Ketones, ur: NEGATIVE mg/dL
Leukocytes,Ua: NEGATIVE
Nitrite: NEGATIVE
Protein, ur: NEGATIVE mg/dL
Specific Gravity, Urine: 1.014 (ref 1.005–1.030)
pH: 5 (ref 5.0–8.0)

## 2019-02-21 NOTE — Progress Notes (Signed)
Spoke w/ via phone for pre-op interview--- pt Lab needs dos---- NONE             Lab results------  CBC, T&S, UA done 02-21-2019 in chart / epic COVID test ------  02-21-2019 Arrive at -------  0600 Patient special instructions:  Day before surgery start light diet as per instruction given by dr Denman George office NPO after ------  MN w/ exception clear liquids until 0500 then nothing by mouth Medications to take morning of surgery ----- Celexa, Syntrhoid, Gabapentin, Arimidex w/ sips of water Diabetic medication ----- n/a Pre-Op special Istructions ----- n/a Patient verbalized understanding of instructions that were given at this phone interview. Patient denies shortness of breath, chest pain, fever, cough a this phone interview.

## 2019-02-22 ENCOUNTER — Ambulatory Visit
Admission: RE | Admit: 2019-02-22 | Discharge: 2019-02-22 | Disposition: A | Discharge status: Home / Self Care | Payer: BLUE CROSS/BLUE SHIELD | Source: Ambulatory Visit | Attending: Oncology | Admitting: Oncology

## 2019-02-22 ENCOUNTER — Telehealth: Payer: Self-pay

## 2019-02-22 DIAGNOSIS — Z170 Estrogen receptor positive status [ER+]: Secondary | ICD-10-CM

## 2019-02-22 DIAGNOSIS — C50911 Malignant neoplasm of unspecified site of right female breast: Secondary | ICD-10-CM

## 2019-02-22 DIAGNOSIS — C8234 Follicular lymphoma grade IIIa, lymph nodes of axilla and upper limb: Secondary | ICD-10-CM

## 2019-02-22 DIAGNOSIS — Z1501 Genetic susceptibility to malignant neoplasm of breast: Secondary | ICD-10-CM

## 2019-02-22 LAB — ABO/RH: ABO/RH(D): B NEG

## 2019-02-22 LAB — NOVEL CORONAVIRUS, NAA (HOSP ORDER, SEND-OUT TO REF LAB; TAT 18-24 HRS): SARS-CoV-2, NAA: NOT DETECTED

## 2019-02-22 NOTE — Telephone Encounter (Signed)
[name] states that she understands her pre op instructions and has no questions at this time.

## 2019-02-25 ENCOUNTER — Encounter (HOSPITAL_BASED_OUTPATIENT_CLINIC_OR_DEPARTMENT_OTHER)
Admission: RE | Disposition: A | Discharge status: Home / Self Care | Payer: Self-pay | Source: Home / Self Care | Attending: Gynecologic Oncology

## 2019-02-25 ENCOUNTER — Ambulatory Visit (HOSPITAL_BASED_OUTPATIENT_CLINIC_OR_DEPARTMENT_OTHER): Payer: BLUE CROSS/BLUE SHIELD | Admitting: Anesthesiology

## 2019-02-25 ENCOUNTER — Encounter (HOSPITAL_BASED_OUTPATIENT_CLINIC_OR_DEPARTMENT_OTHER): Payer: Self-pay | Admitting: Gynecologic Oncology

## 2019-02-25 ENCOUNTER — Other Ambulatory Visit: Payer: Self-pay

## 2019-02-25 ENCOUNTER — Ambulatory Visit (HOSPITAL_BASED_OUTPATIENT_CLINIC_OR_DEPARTMENT_OTHER)
Admission: RE | Admit: 2019-02-25 | Discharge: 2019-02-25 | Disposition: A | Discharge status: Home / Self Care | Payer: BLUE CROSS/BLUE SHIELD | Attending: Gynecologic Oncology | Admitting: Gynecologic Oncology

## 2019-02-25 ENCOUNTER — Ambulatory Visit (HOSPITAL_BASED_OUTPATIENT_CLINIC_OR_DEPARTMENT_OTHER): Payer: BLUE CROSS/BLUE SHIELD | Admitting: Physician Assistant

## 2019-02-25 DIAGNOSIS — Z1502 Genetic susceptibility to malignant neoplasm of ovary: Secondary | ICD-10-CM | POA: Diagnosis not present

## 2019-02-25 DIAGNOSIS — Z853 Personal history of malignant neoplasm of breast: Secondary | ICD-10-CM | POA: Diagnosis not present

## 2019-02-25 DIAGNOSIS — Z170 Estrogen receptor positive status [ER+]: Secondary | ICD-10-CM | POA: Insufficient documentation

## 2019-02-25 DIAGNOSIS — F419 Anxiety disorder, unspecified: Secondary | ICD-10-CM | POA: Insufficient documentation

## 2019-02-25 DIAGNOSIS — M8580 Other specified disorders of bone density and structure, unspecified site: Secondary | ICD-10-CM | POA: Insufficient documentation

## 2019-02-25 DIAGNOSIS — F329 Major depressive disorder, single episode, unspecified: Secondary | ICD-10-CM | POA: Insufficient documentation

## 2019-02-25 DIAGNOSIS — C50911 Malignant neoplasm of unspecified site of right female breast: Secondary | ICD-10-CM | POA: Diagnosis present

## 2019-02-25 DIAGNOSIS — Z8052 Family history of malignant neoplasm of bladder: Secondary | ICD-10-CM | POA: Insufficient documentation

## 2019-02-25 DIAGNOSIS — Z1509 Genetic susceptibility to other malignant neoplasm: Secondary | ICD-10-CM | POA: Diagnosis not present

## 2019-02-25 DIAGNOSIS — Z801 Family history of malignant neoplasm of trachea, bronchus and lung: Secondary | ICD-10-CM | POA: Insufficient documentation

## 2019-02-25 DIAGNOSIS — Z15060 Genetic susceptibility to colorectal cancer: Secondary | ICD-10-CM | POA: Diagnosis present

## 2019-02-25 DIAGNOSIS — Z791 Long term (current) use of non-steroidal anti-inflammatories (NSAID): Secondary | ICD-10-CM | POA: Insufficient documentation

## 2019-02-25 DIAGNOSIS — C50411 Malignant neoplasm of upper-outer quadrant of right female breast: Secondary | ICD-10-CM | POA: Insufficient documentation

## 2019-02-25 DIAGNOSIS — Z8249 Family history of ischemic heart disease and other diseases of the circulatory system: Secondary | ICD-10-CM | POA: Insufficient documentation

## 2019-02-25 DIAGNOSIS — Z800 Family history of malignant neoplasm of digestive organs: Secondary | ICD-10-CM | POA: Insufficient documentation

## 2019-02-25 DIAGNOSIS — Z803 Family history of malignant neoplasm of breast: Secondary | ICD-10-CM | POA: Insufficient documentation

## 2019-02-25 DIAGNOSIS — M1990 Unspecified osteoarthritis, unspecified site: Secondary | ICD-10-CM | POA: Insufficient documentation

## 2019-02-25 DIAGNOSIS — Z7951 Long term (current) use of inhaled steroids: Secondary | ICD-10-CM | POA: Insufficient documentation

## 2019-02-25 DIAGNOSIS — Z1501 Genetic susceptibility to malignant neoplasm of breast: Secondary | ICD-10-CM | POA: Diagnosis not present

## 2019-02-25 DIAGNOSIS — Z6835 Body mass index (BMI) 35.0-35.9, adult: Secondary | ICD-10-CM | POA: Insufficient documentation

## 2019-02-25 DIAGNOSIS — Z9221 Personal history of antineoplastic chemotherapy: Secondary | ICD-10-CM | POA: Insufficient documentation

## 2019-02-25 DIAGNOSIS — Z8042 Family history of malignant neoplasm of prostate: Secondary | ICD-10-CM | POA: Insufficient documentation

## 2019-02-25 DIAGNOSIS — E785 Hyperlipidemia, unspecified: Secondary | ICD-10-CM | POA: Insufficient documentation

## 2019-02-25 DIAGNOSIS — E669 Obesity, unspecified: Secondary | ICD-10-CM | POA: Insufficient documentation

## 2019-02-25 DIAGNOSIS — E039 Hypothyroidism, unspecified: Secondary | ICD-10-CM | POA: Insufficient documentation

## 2019-02-25 DIAGNOSIS — Z8572 Personal history of non-Hodgkin lymphomas: Secondary | ICD-10-CM | POA: Insufficient documentation

## 2019-02-25 DIAGNOSIS — M797 Fibromyalgia: Secondary | ICD-10-CM | POA: Insufficient documentation

## 2019-02-25 DIAGNOSIS — Z79899 Other long term (current) drug therapy: Secondary | ICD-10-CM | POA: Insufficient documentation

## 2019-02-25 HISTORY — DX: Genetic susceptibility to other malignant neoplasm: Z15.09

## 2019-02-25 HISTORY — DX: Follicular lymphoma grade IIIa, unspecified site: C82.30

## 2019-02-25 HISTORY — DX: Adverse effect of antineoplastic and immunosuppressive drugs, initial encounter: G62.0

## 2019-02-25 HISTORY — DX: Hypothyroidism, unspecified: E03.9

## 2019-02-25 HISTORY — DX: Genetic susceptibility to malignant neoplasm of breast: Z15.01

## 2019-02-25 HISTORY — DX: Personal history of malignant neoplasm of breast: Z85.3

## 2019-02-25 HISTORY — DX: Presence of spectacles and contact lenses: Z97.3

## 2019-02-25 HISTORY — DX: Genetic susceptibility to malignant neoplasm of breast: Z15.02

## 2019-02-25 HISTORY — DX: Personal history of other diseases of the musculoskeletal system and connective tissue: Z87.39

## 2019-02-25 HISTORY — DX: Drug-induced polyneuropathy: T45.1X5A

## 2019-02-25 HISTORY — PX: ROBOTIC ASSISTED SALPINGO OOPHERECTOMY: SHX6082

## 2019-02-25 LAB — TYPE AND SCREEN
ABO/RH(D): B NEG
Antibody Screen: NEGATIVE

## 2019-02-25 SURGERY — SALPINGO-OOPHORECTOMY, ROBOT-ASSISTED
Anesthesia: General | Site: Abdomen | Laterality: Bilateral

## 2019-02-25 MED ORDER — PROPOFOL 10 MG/ML IV BOLUS
INTRAVENOUS | Status: DC | PRN
  Administered 2019-02-25: 160 mg via INTRAVENOUS

## 2019-02-25 MED ORDER — SCOPOLAMINE 1 MG/3DAYS TD PT72
1.0000 | MEDICATED_PATCH | TRANSDERMAL | Status: DC
  Administered 2019-02-25: 1.5 mg via TRANSDERMAL
  Filled 2019-02-25: qty 1

## 2019-02-25 MED ORDER — ROCURONIUM BROMIDE 10 MG/ML (PF) SYRINGE
PREFILLED_SYRINGE | INTRAVENOUS | Status: AC
  Filled 2019-02-25: qty 10

## 2019-02-25 MED ORDER — ONDANSETRON HCL 4 MG/2ML IJ SOLN
INTRAMUSCULAR | Status: AC
  Filled 2019-02-25: qty 2

## 2019-02-25 MED ORDER — LIDOCAINE 2% (20 MG/ML) 5 ML SYRINGE
INTRAMUSCULAR | Status: DC | PRN
  Administered 2019-02-25: 60 mg via INTRAVENOUS

## 2019-02-25 MED ORDER — DEXAMETHASONE SODIUM PHOSPHATE 10 MG/ML IJ SOLN
INTRAMUSCULAR | Status: DC | PRN
  Administered 2019-02-25: 10 mg via INTRAVENOUS

## 2019-02-25 MED ORDER — CEFAZOLIN SODIUM-DEXTROSE 2-4 GM/100ML-% IV SOLN
INTRAVENOUS | Status: AC
  Filled 2019-02-25: qty 100

## 2019-02-25 MED ORDER — DEXAMETHASONE SODIUM PHOSPHATE 4 MG/ML IJ SOLN
4.0000 mg | INTRAMUSCULAR | Status: DC
  Filled 2019-02-25: qty 1

## 2019-02-25 MED ORDER — CEFAZOLIN SODIUM-DEXTROSE 2-4 GM/100ML-% IV SOLN
2.0000 g | INTRAVENOUS | Status: AC
  Administered 2019-02-25: 2 g via INTRAVENOUS
  Filled 2019-02-25: qty 100

## 2019-02-25 MED ORDER — FENTANYL CITRATE (PF) 100 MCG/2ML IJ SOLN
INTRAMUSCULAR | Status: DC | PRN
  Administered 2019-02-25 (×2): 50 ug via INTRAVENOUS

## 2019-02-25 MED ORDER — BUPIVACAINE HCL 0.25 % IJ SOLN
INTRAMUSCULAR | Status: DC | PRN
  Administered 2019-02-25: 14 mL

## 2019-02-25 MED ORDER — OXYCODONE HCL 5 MG PO TABS
5.0000 mg | ORAL_TABLET | Freq: Once | ORAL | Status: DC | PRN
  Filled 2019-02-25: qty 1

## 2019-02-25 MED ORDER — SODIUM CHLORIDE 0.9 % IR SOLN
Status: DC | PRN
  Administered 2019-02-25: 1000 mL

## 2019-02-25 MED ORDER — SUGAMMADEX SODIUM 200 MG/2ML IV SOLN
INTRAVENOUS | Status: DC | PRN
  Administered 2019-02-25: 200 mg via INTRAVENOUS

## 2019-02-25 MED ORDER — MIDAZOLAM HCL 2 MG/2ML IJ SOLN
INTRAMUSCULAR | Status: AC
  Filled 2019-02-25: qty 2

## 2019-02-25 MED ORDER — ONDANSETRON HCL 4 MG/2ML IJ SOLN
4.0000 mg | Freq: Once | INTRAMUSCULAR | Status: DC | PRN
  Filled 2019-02-25: qty 2

## 2019-02-25 MED ORDER — CELECOXIB 200 MG PO CAPS
ORAL_CAPSULE | ORAL | Status: AC
  Filled 2019-02-25: qty 1

## 2019-02-25 MED ORDER — LIDOCAINE 2% (20 MG/ML) 5 ML SYRINGE
INTRAMUSCULAR | Status: AC
  Filled 2019-02-25: qty 5

## 2019-02-25 MED ORDER — FENTANYL CITRATE (PF) 100 MCG/2ML IJ SOLN
INTRAMUSCULAR | Status: AC
  Filled 2019-02-25: qty 2

## 2019-02-25 MED ORDER — GLYCOPYRROLATE 0.2 MG/ML IJ SOLN
INTRAMUSCULAR | Status: DC | PRN
  Administered 2019-02-25: .2 mg via INTRAVENOUS

## 2019-02-25 MED ORDER — GABAPENTIN 300 MG PO CAPS
300.0000 mg | ORAL_CAPSULE | ORAL | Status: DC
  Filled 2019-02-25: qty 1

## 2019-02-25 MED ORDER — ONDANSETRON HCL 4 MG/2ML IJ SOLN
INTRAMUSCULAR | Status: DC | PRN
  Administered 2019-02-25: 4 mg via INTRAVENOUS

## 2019-02-25 MED ORDER — OXYCODONE HCL 5 MG/5ML PO SOLN
5.0000 mg | Freq: Once | ORAL | Status: DC | PRN
  Filled 2019-02-25: qty 5

## 2019-02-25 MED ORDER — ACETAMINOPHEN 500 MG PO TABS
1000.0000 mg | ORAL_TABLET | ORAL | Status: AC
  Administered 2019-02-25: 1000 mg via ORAL
  Filled 2019-02-25: qty 2

## 2019-02-25 MED ORDER — PHENYLEPHRINE 40 MCG/ML (10ML) SYRINGE FOR IV PUSH (FOR BLOOD PRESSURE SUPPORT)
PREFILLED_SYRINGE | INTRAVENOUS | Status: DC | PRN
  Administered 2019-02-25: 80 ug via INTRAVENOUS

## 2019-02-25 MED ORDER — MIDAZOLAM HCL 5 MG/5ML IJ SOLN
INTRAMUSCULAR | Status: DC | PRN
  Administered 2019-02-25: 2 mg via INTRAVENOUS

## 2019-02-25 MED ORDER — ACETAMINOPHEN 500 MG PO TABS
ORAL_TABLET | ORAL | Status: AC
  Filled 2019-02-25: qty 2

## 2019-02-25 MED ORDER — LACTATED RINGERS IV SOLN
INTRAVENOUS | Status: DC
  Administered 2019-02-25 (×3): via INTRAVENOUS
  Filled 2019-02-25: qty 1000

## 2019-02-25 MED ORDER — PROPOFOL 10 MG/ML IV BOLUS
INTRAVENOUS | Status: AC
  Filled 2019-02-25: qty 20

## 2019-02-25 MED ORDER — DEXAMETHASONE SODIUM PHOSPHATE 10 MG/ML IJ SOLN
INTRAMUSCULAR | Status: AC
  Filled 2019-02-25: qty 1

## 2019-02-25 MED ORDER — CELECOXIB 400 MG PO CAPS
400.0000 mg | ORAL_CAPSULE | ORAL | Status: AC
  Administered 2019-02-25: 400 mg via ORAL
  Filled 2019-02-25: qty 1

## 2019-02-25 MED ORDER — ROCURONIUM BROMIDE 10 MG/ML (PF) SYRINGE
PREFILLED_SYRINGE | INTRAVENOUS | Status: DC | PRN
  Administered 2019-02-25: 10 mg via INTRAVENOUS
  Administered 2019-02-25: 60 mg via INTRAVENOUS

## 2019-02-25 MED ORDER — SCOPOLAMINE 1 MG/3DAYS TD PT72
MEDICATED_PATCH | TRANSDERMAL | Status: AC
  Filled 2019-02-25: qty 1

## 2019-02-25 MED ORDER — FENTANYL CITRATE (PF) 100 MCG/2ML IJ SOLN
25.0000 ug | INTRAMUSCULAR | Status: DC | PRN
  Administered 2019-02-25 (×2): 50 ug via INTRAVENOUS
  Filled 2019-02-25: qty 1

## 2019-02-25 MED ORDER — LIDOCAINE 20MG/ML (2%) 15 ML SYRINGE OPTIME
INTRAMUSCULAR | Status: DC | PRN
  Administered 2019-02-25: 1.5 mg/kg/h via INTRAVENOUS

## 2019-02-25 SURGICAL SUPPLY — 58 items
APPLICATOR SURGIFLO ENDO (HEMOSTASIS) IMPLANT
BAG LAPAROSCOPIC 12 15 PORT 16 (BASKET) IMPLANT
BAG RETRIEVAL 12/15 (BASKET)
BLADE SURG 10 STRL SS (BLADE) IMPLANT
COVER BACK TABLE 60X90IN (DRAPES) ×2 IMPLANT
COVER TIP SHEARS 8 DVNC (MISCELLANEOUS) ×1 IMPLANT
COVER TIP SHEARS 8MM DA VINCI (MISCELLANEOUS) ×1
COVER WAND RF STERILE (DRAPES) ×2 IMPLANT
DECANTER SPIKE VIAL GLASS SM (MISCELLANEOUS) IMPLANT
DERMABOND ADVANCED (GAUZE/BANDAGES/DRESSINGS) ×1
DERMABOND ADVANCED .7 DNX12 (GAUZE/BANDAGES/DRESSINGS) ×1 IMPLANT
DRAPE ARM DVNC X/XI (DISPOSABLE) ×4 IMPLANT
DRAPE COLUMN DVNC XI (DISPOSABLE) ×1 IMPLANT
DRAPE DA VINCI XI ARM (DISPOSABLE) ×4
DRAPE DA VINCI XI COLUMN (DISPOSABLE) ×1
DRAPE SHEET LG 3/4 BI-LAMINATE (DRAPES) ×2 IMPLANT
DRAPE SURG IRRIG POUCH 19X23 (DRAPES) ×2 IMPLANT
ELECT REM PT RETURN 9FT ADLT (ELECTROSURGICAL) ×2
ELECTRODE REM PT RTRN 9FT ADLT (ELECTROSURGICAL) ×1 IMPLANT
GAUZE 4X4 16PLY RFD (DISPOSABLE) ×2 IMPLANT
GLOVE BIO SURGEON STRL SZ 6 (GLOVE) ×8 IMPLANT
GLOVE BIO SURGEON STRL SZ 6.5 (GLOVE) ×4 IMPLANT
HOLDER FOLEY CATH W/STRAP (MISCELLANEOUS) ×2 IMPLANT
IRRIG SUCT STRYKERFLOW 2 WTIP (MISCELLANEOUS) ×2
IRRIGATION SUCT STRKRFLW 2 WTP (MISCELLANEOUS) ×1 IMPLANT
KIT PROCEDURE DA VINCI SI (MISCELLANEOUS)
KIT PROCEDURE DVNC SI (MISCELLANEOUS) IMPLANT
KIT TURNOVER CYSTO (KITS) IMPLANT
LEGGING LITHOTOMY PAIR STRL (DRAPES) ×2 IMPLANT
MANIPULATOR UTERINE 4.5 ZUMI (MISCELLANEOUS) ×2 IMPLANT
NEEDLE HYPO 22GX1.5 SAFETY (NEEDLE) ×2 IMPLANT
NEEDLE SPNL 18GX3.5 QUINCKE PK (NEEDLE) IMPLANT
OBTURATOR OPTICAL STANDARD 8MM (TROCAR) ×1
OBTURATOR OPTICAL STND 8 DVNC (TROCAR) ×1
OBTURATOR OPTICALSTD 8 DVNC (TROCAR) ×1 IMPLANT
PACK ROBOT GYN CUSTOM WL (TRAY / TRAY PROCEDURE) ×2 IMPLANT
PACK ROBOTIC GOWN (GOWN DISPOSABLE) ×4 IMPLANT
PAD POSITIONING PINK XL (MISCELLANEOUS) ×2 IMPLANT
PENCIL BUTTON HOLSTER BLD 10FT (ELECTRODE) IMPLANT
PORT ACCESS TROCAR AIRSEAL 12 (TROCAR) ×1 IMPLANT
PORT ACCESS TROCAR AIRSEAL 5M (TROCAR) ×1
POUCH SPECIMEN RETRIEVAL 10MM (ENDOMECHANICALS) ×4 IMPLANT
SEAL CANN UNIV 5-8 DVNC XI (MISCELLANEOUS) ×3 IMPLANT
SEAL XI 5MM-8MM UNIVERSAL (MISCELLANEOUS) ×3
SET TRI-LUMEN FLTR TB AIRSEAL (TUBING) ×2 IMPLANT
SURGIFLO W/THROMBIN 8M KIT (HEMOSTASIS) IMPLANT
SUT VIC AB 0 CT1 36 (SUTURE) IMPLANT
SUT VIC AB 3-0 SH 27 (SUTURE)
SUT VIC AB 3-0 SH 27X BRD (SUTURE) IMPLANT
SUT VIC AB 4-0 PS2 18 (SUTURE) ×4 IMPLANT
SYR 10ML LL (SYRINGE) IMPLANT
TRAP SPECIMEN MUCOUS 40CC (MISCELLANEOUS) IMPLANT
TRAY FOLEY W/BAG SLVR 14FR (SET/KITS/TRAYS/PACK) ×2 IMPLANT
TROCAR XCEL 12X100 BLDLESS (ENDOMECHANICALS) IMPLANT
TUBE CONNECTING 12X1/4 (SUCTIONS) ×2 IMPLANT
UNDERPAD 30X30 (UNDERPADS AND DIAPERS) ×2 IMPLANT
WATER STERILE IRR 1000ML POUR (IV SOLUTION) ×2 IMPLANT
YANKAUER SUCT BULB TIP NO VENT (SUCTIONS) IMPLANT

## 2019-02-25 NOTE — H&P (Signed)
[place] at [facility]    Progress Note : Established Patient FOLLOW-UP    Consult was originally requested by Dr. Jana Hakim  CC:  BRCA 2 mutation  Oncologic Summary: 1. BRCA2 mutation 2. 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 Ultrasound did not visualize the ovaries and CA 125 was within normal limits at 31.   We reviewed whether or not hysterectomy should be done as part of the procedure. At present she is leaning towards BSO alone (less recovery and risk). We reviewed the procedure and its risks  bleeding, infection, damage to internal organs (such as bladder,ureters, bowels), blood clot, reoperation and rehospitalization. I discussed postop restrictions and healing.   HPI: Ms. GRACEANNE GUIN  is a very nice 58 y.o.  P2  She has a personal history of breast cancer diagnosed in 2012.  In 2018 she lost her mother to breast cancer and coincidentally a cousin was diagnosed with breast cancer on her mother's side.  Therefore genetic testing 05/2017 was undertaken and the patient was found to have a BRCA2 pathogenic variant.  [employer] testing  is reviewed and she had a c.[id number](p.Leu2092Profs*7) Pathogenic Variant.  In addition a VUS was found in [facility] 3.  She has not had any screening GYN exams for some time including Pap.  She has not yet had transvaginal ultrasound or Ca125 for screening purposes.  She denies bloating, pelvic pain, changes in appetite, or unexpected weight loss.  Denies postmenopausal bleeding.  She does suffer from hot flashes but was restarted on anastrozole in 2019.  She does have a history of tamoxifen use for 2-1/2 years with her initial breast cancer treatment.  Interval Hx:  [place] on 07/21/18 showed a normal uterus measuring 7.7x3.1x4.1cm  with a 4.48m thickness, normal ovaries bilaterally. CA 125 on 07/18/27 was normal at 31.6.  CT restaging for her breast cancer in December, 2019 showed normal gynecologic organs and no recurrent disease.   TVUS on 01/04/19 showed a 6.6cm uterus with 476mendometrial stripe, the ovaries were not visualized but no adnexal masses were seen or free fluid.  Measurement of disease:  Recent Labs    01/04/19 0842  CAN125 31.6    . Pending further work-up   Radiology: . Pending further work-up    Oncologic History: See chart for her breast cancer history essentially she was treated with lumpectomy followed by radiation and chemotherapy with treatment completing in 2012.  States she was [place] from [place] followed by tamoxifen for 2-1/2 years with the last dose of tamoxifen being February 2018.  She was restarted on anastrozole approximately April 2019.   Oncology History  Malignant neoplasm of upper-outer quadrant of right breast in female, estrogen receptor positive ([facility] 04/10/2017 Initial Diagnosis   Malignant neoplasm of upper-outer quadrant of right breast in female, estrogen receptor positive ([facility]  06/03/2017 Genetic Testing   The Common Hereditary Cancers Panel + Myelodysplastic syndrome[facility] The following genes were evaluated for sequence changes and exonic deletions/duplications: APC, ATM, AXIN2, BARD1, BLM, BMPR1A, BRCA1, BRCA2, BRIP1, CDH1, CDK4, CDKN2A (p14ARF), CDKN2A (p16INK4a), CEBPA, CHEK2, CTNNA1, DICER1, EPCAM*, GATA2, GREM1*, HRAS, KIT, MEN1, MLH1, MSH2, MSH3, MSH6, MUTYH, NBN, NF1, PALB2, PDGFRA, PMS2, POLD1, POLE, PTEN, RAD50, RAD51C, RAD51D, RUNX1, SDHB, SDHC, SDHD, SMAD4, SMARCA4,  STK11, TERC, TERT, TP53, TSC1, TSC2, VHL. The following genes were evaluated for sequence changes only:HOXB13*, NTHL1*, SDHA.  Results: Positive: Pathnogeic variant in BRCA2 c.[id number] (p.Leu2092Profs*7). A variant of  uncertain significance in the gene MSH3 was also identified c.2695A>G (p.Met899Val).      Current Meds:  Outpatient Encounter Medications as of 12/31/2018  Medication Sig  . alendronate (FOSAMAX) 35 MG tablet Take 1 tablet (35 mg total) by mouth every Wednesday. Take with a full glass of water on an empty stomach.  Marland Kitchen anastrozole (ARIMIDEX) 1 MG tablet Take 1 tablet (1 mg total) by mouth daily.  . chlorpheniramine (CHLOR-TRIMETON) 4 MG tablet Take 4 mg by mouth daily as needed for allergies.   . citalopram (CELEXA) 40 MG tablet Take 1 tablet (40 mg total) by mouth daily.  . Cyanocobalamin (VITAMIN B-12) 1000 MCG SUBL Place 1,000 mcg under the tongue daily.  Marland Kitchen gabapentin (NEURONTIN) 300 MG capsule Take 1 capsule (300 mg total) by mouth 3 (three) times daily.  Marland Kitchen levothyroxine (SYNTHROID, LEVOTHROID) 88 MCG tablet Take 88 mcg by mouth daily before breakfast.  . meloxicam (MOBIC) 7.5 MG tablet Take 2 tablets (15 mg total) by mouth daily.  . mometasone (NASONEX) 50 MCG/ACT nasal spray Place 2 sprays into the nose at bedtime.  . Multiple Vitamin (MULTIVITAMIN) capsule Take 1 capsule by mouth 2 (two) times a week.   . pravastatin (PRAVACHOL) 40 MG tablet Take 1 tablet (40 mg total) by mouth at bedtime.   No facility-administered encounter medications on file as of 12/31/2018.     We did discuss holding fish oil and meloxicam prior to surgery.  She states she is not regularly taking aspirin but knows to hold that prior to surgery  Allergy: No Known Allergies  Social Hx:   Social History   Socioeconomic History  . Marital status: Married    Spouse name: Not on file  . Number of children: Not on file  . Years of education: Not on file  . Highest education level: Not on file  Occupational History  . Not on file  Tobacco Use  . Smoking status: Never Smoker  . Smokeless tobacco: Never Used  Substance and Sexual Activity  . Alcohol use: Not Currently  . Drug use: Never  . Sexual activity:  Not on file  Other Topics Concern  . Not on file  Social History Narrative  . Not on file   Social Determinants of Health   Financial Resource Strain:   . Difficulty of Paying Living Expenses: Not on file  Food Insecurity:   . Worried About Charity fundraiser in the Last Year: Not on file  . Ran Out of Food in the Last Year: Not on file  Transportation Needs:   . Lack of Transportation (Medical): Not on file  . Lack of Transportation (Non-Medical): Not on file  Physical Activity:   . Days of Exercise per Week: Not on file  . Minutes of Exercise per Session: Not on file  Stress:   . Feeling of Stress : Not on file  Social Connections:   . Frequency of Communication with Friends and Family: Not on file  . Frequency of Social Gatherings with Friends and Family: Not on file  . Attends Religious Services: Not on file  . Active Member of Clubs or Organizations: Not on file  . Attends Archivist Meetings: Not on file  . Marital Status: Not on file  Intimate Partner Violence:   . Fear of  Current or Ex-Partner: Not on file  . Emotionally Abused: Not on file  . Physically Abused: Not on file  . Sexually Abused: Not on file    Past Surgical Hx:  Past Surgical History:  Procedure Laterality Date  . BREAST EXCISIONAL BIOPSY Left 2019  . BREAST LUMPECTOMY Right 2012  . BREAST LUMPECTOMY WITH AXILLARY LYMPH NODE BIOPSY Right 07/08/2010   W/  PAC INSERTION  . MANDIBLE SURGERY  1991   due to accident as a child. sx to straighten dental bite.  Marland Kitchen PORT-A-CATH REMOVAL  12/28/2010  . RADIOACTIVE SEED GUIDED AXILLARY SENTINEL LYMPH NODE Left 03/02/2018   Procedure: SEED TARGETED DEEP LEFT AXILLARY LYMPH NODE BIOPSY;  Surgeon: [name], MD;  Location: [place];  Service: General;  Laterality: Left;    Past Medical Hx:  Past Medical History:  Diagnosis Date  . Anxiety   . Arthritis   . BRCA2 gene mutation positive in female   . De Quervain's syndrome (tenosynovitis)     Left wrist  . Depression   . Family history of BRCA2 gene positive   . Fibromyalgia   . Follicular lymphoma grade 3a [facility]) oncologist-- dr Jana Hakim   dx 03-02-2018  s/p left axilly node dissection (NHL),  no further treatment other than active survillance  . History of gout    02-21-2019  per pt has years since last episode  . History of radiation therapy    RIGHT BREAST COMPLETED 12/ 2012  . 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 History of TMJ syndrome   . Hyperlipidemia   . Hypothyroidism   . 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 Osteopenia 08/09/2011  . 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 Personal history of chemotherapy    COMPLETED 12/ 2012  . Personal history of radiation therapy   . Wears glasses     Past Gynecological History:   GYNECOLOGIC HISTORY:  No LMP recorded. Patient is postmenopausal. Menarche: 58 years old P 2 LMP 58 yo Contraceptive OCP and Mirena x ~10 years HRT vaginal estrogen, no oral HRT  Last Pap 07/2017 - negative with neg HPV  Family Hx:  Family History  Problem Relation Age of Onset  . Cancer Maternal Aunt 75       colon - great maternal aunt  . Cancer Paternal Aunt 83       gyn- unsure if ovarian or uterine- met to lung  . Breast cancer Maternal Aunt 57  . Breast cancer Mother 51  . Prostate cancer Paternal Uncle        dx >50  . Heart disease Maternal Grandmother 80  . Pneumonia Maternal Grandfather 6  . Lung cancer Paternal Grandmother 27  . Leukemia Paternal Grandfather 30  . Pancreatic cancer Paternal Aunt 66  . Bladder Cancer Paternal Aunt 44  . Lung cancer Paternal Aunt   . Other Cousin        BRCA2 +  . Breast cancer Cousin        dx <50  . Colon cancer Neg Hx     Review of  Systems:  Review of Systems  Constitutional: Negative.   HENT:  Negative.   Eyes: Negative.   Respiratory: Negative.   Cardiovascular: Negative.  Gastrointestinal: Negative.   Endocrine: Negative.   Genitourinary: Positive for frequency.   Musculoskeletal: Negative.   Skin: Negative.   Neurological: Negative.   Hematological: Negative.   Psychiatric/Behavioral: Negative.   notes joint stiffness  Vitals:  Blood pressure (!) 144/95, pulse 80, temperature 98 F (36.7 C), temperature source Oral, resp. rate 18, height '5\' 4"'$  (1.626 m), weight 204 lb 3.2 oz (92.6 kg), SpO2 98 %. Body mass index is 35.05 kg/m.   Physical Exam: ECOG PERFORMANCE STATUS: 0 - Asymptomatic   General :  Well developed, 58 y.o., female in no apparent distress HEENT:  Normocephalic/atraumatic, symmetric, EOMI, eyelids normal Neck:   Supple, no masses.  Lymphatics:  No cervical/ submandibular/ supraclavicular/ infraclavicular/ inguinal adenopathy Respiratory:  Respirations unlabored, no use of accessory muscles CV:   Deferred Breast:  Deferred Musculoskeletal: No CVA tenderness, normal muscle strength. Abdomen:  Soft, non-tender and nondistended. No evidence of hernia. No masses. Extremities:  No lymphedema, no erythema, non-tender. Skin:   Normal inspection Neuro/Psych:  No focal motor deficit, no abnormal mental status. Normal gait. Normal affect. Alert and oriented to person, place, and time  Genito Urinary 07/17/2017: Vulva: Normal external female genitalia.  Bladder/urethra: Urethral meatus normal in size and location. No lesions or   masses, well supported bladder Speculum exam: Some mild pelvic relaxation Vagina: No lesion, no discharge, no bleeding. Cervix: Normal appearing, no lesions. Bimanual exam:  Uterus: Normal size, mobile.  Adnexa: No masses.  What feels to be a palpably normal left ovary ; right adnexa not definable but no masses Rectovaginal:  Good tone, no masses, no cul de sac  nodularity, no parametrial involvement or nodularity.  Thereasa Solo, MD  02/25/2019, 7:31 AM  Cc: Lurline Del MD (Referring oncologist) Dr. Marda Stalker (PCP)

## 2019-02-25 NOTE — Transfer of Care (Signed)
Immediate Anesthesia Transfer of Care Note  Patient: [name]  Procedure(s) Performed: XI ROBOTIC ASSISTED SALPINGO OOPHORECTOMY (Bilateral Abdomen)  Patient Location: PACU  Anesthesia Type:General  Level of Consciousness: awake, alert  and oriented  Airway & Oxygen Therapy: Patient Spontanous Breathing and Patient connected to face mask oxygen  Post-op Assessment: Report given to RN and Post -op Vital signs reviewed and stable  Post vital signs: Reviewed and stable  Last Vitals:  Vitals Value Taken Time  BP 166/109 02/25/19 0922  Temp    Pulse 90 02/25/19 0925  Resp 18 02/25/19 0927  SpO2 98 % 02/25/19 0925  Vitals shown include unvalidated device data.  Last Pain:  Vitals:   02/25/19 0629  TempSrc: Oral         Complications: No apparent anesthesia complications

## 2019-02-25 NOTE — Discharge Instructions (Addendum)
02/25/2019  Return to work: 4 weeks  Do not take mobic (meloxicam) and ibuprofen together. Use one or the other since they work similarly.  Activity: 1. Be up and out of the bed during the day.  Take a nap if needed.  You may walk up steps but be careful and use the hand rail.  Stair climbing will tire you more than you think, you may need to stop part way and rest.   2. No lifting or straining for 4 weeks.  3. No driving for 1 weeks.  Do Not drive if you are taking narcotic pain medicine.  4. Shower daily.  Use soap and water on your incision and pat dry; don't rub.   5. No sexual activity and nothing in the vagina for 1-2 weeks.  Medications:  - Take ibuprofen and tylenol first line for pain control. Take these regularly (every 6 hours) to decrease the build up of pain.  - If necessary, for severe pain not relieved by ibuprofen, take oxycodone.  - While taking oxycodone you should take sennakot every night to reduce the likelihood of constipation. If this causes diarrhea, stop its use.  Diet: 1. Low sodium Heart Healthy Diet is recommended.  2. It is safe to use a laxative if you have difficulty moving your bowels.   Wound Care: 1. Keep clean and dry.  Shower daily.  Reasons to call the Doctor:   Fever - Oral temperature greater than 100.4 degrees Fahrenheit  Foul-smelling vaginal discharge  Difficulty urinating  Nausea and vomiting  Increased pain at the site of the incision that is unrelieved with pain medicine.  Difficulty breathing with or without chest pain  New calf pain especially if only on one side  Sudden, continuing increased vaginal bleeding with or without clots.   Follow-up: 1. See Everitt Amber in 4 weeks.  Contacts: For questions or concerns you should contact:  Dr. Everitt Amber at [phone] After hours and on week-ends call [phone] and ask to speak to the physician on call for Gynecologic Oncology     Post Anesthesia Home Care  Instructions  Activity: Get plenty of rest for the remainder of the day. A responsible individual must stay with you for 24 hours following the procedure.  For the next 24 hours, DO NOT: -Drive a car -Paediatric nurse -Drink alcoholic beverages -Take any medication unless instructed by your physician -Make any legal decisions or sign important papers.  Meals: Start with liquid foods such as gelatin or soup. Progress to regular foods as tolerated. Avoid greasy, spicy, heavy foods. If nausea and/or vomiting occur, drink only clear liquids until the nausea and/or vomiting subsides. Call your physician if vomiting continues.  Special Instructions/Symptoms: Your throat may feel dry or sore from the anesthesia or the breathing tube placed in your throat during surgery. If this causes discomfort, gargle with warm salt water. The discomfort should disappear within 24 hours.  If you had a scopolamine patch placed behind your ear for the management of post- operative nausea and/or vomiting:  1. The medication in the patch is effective for 72 hours, after which it should be removed.  Wrap patch in a tissue and discard in the trash. Wash hands thoroughly with soap and water. 2. You may remove the patch earlier than 72 hours if you experience unpleasant side effects which may include dry mouth, dizziness or visual disturbances. 3. Avoid touching the patch. Wash your hands with soap and water after contact with the  patch.

## 2019-02-25 NOTE — Anesthesia Procedure Notes (Signed)
Procedure Name: Intubation Date/Time: 02/25/2019 8:06 AM Performed by: Avy Barlett D, CRNA Pre-anesthesia Checklist: Patient identified, Emergency Drugs available, Suction available and Patient being monitored Patient Re-evaluated:Patient Re-evaluated prior to induction Oxygen Delivery Method: Circle system utilized Preoxygenation: Pre-oxygenation with 100% oxygen Induction Type: IV induction Ventilation: Mask ventilation without difficulty Laryngoscope Size: Mac and 3 Grade View: Grade I Tube type: Oral Tube size: 7.0 mm Number of attempts: 1 Airway Equipment and Method: Stylet Placement Confirmation: ETT inserted through vocal cords under direct vision,  positive ETCO2 and breath sounds checked- equal and bilateral Secured at: 21 cm Tube secured with: Tape Dental Injury: Teeth and Oropharynx as per pre-operative assessment

## 2019-02-25 NOTE — Op Note (Signed)
OPERATIVE NOTE  Date: 02/25/19  Preoperative Diagnosis: BRCA 2 germline mutation, obesity   Postoperative Diagnosis:  same  Procedure(s) Performed: Robotic-assisted laparoscopic bilateral salpingo-oophorectomy  Surgeon: Emma Rossi, M.D.  Assistant Surgeon: Melissa Cross. (a provider assistant was necessary for tissue manipulation, management of robotic instrumentation, retraction and positioning due to the complexity of the case and hospital policies).   Anesthesia: Gen. endotracheal.  Specimens: Bilateral ovaries, fallopian tubes, pelvic washings  Estimated Blood Loss: <10 mL. Blood Replacement: None  Complications: none  Indication for Procedure: BRCA 2 germline mutation.  Operative Findings: normal upper abdomen, normal appearing uterus with tubes and ovaries.   Procedure: The patient's taken to the operating room and placed under general endotracheal anesthesia testing difficulty. She is placed in a dorsolithotomy position and cervical acromial pad was placed. The arms were tucked with care taken to pad the olecranon process. And prepped and draped in usual sterile fashion. A uterine manipulator (zumi) was placed vaginally. A 5mm incision was made in the left upper quadrant palmer's point and a 5 mm Optiview trocar used to enter the abdomen under direct visualization. With entry into the abdomen and then maintenance of 15 mm of mercury the patient was placed in Trendelenburg position. An incision was made in the umbilicus and a 10mm trochar was placed through this site. Two incisions were made lateral to the umbilical incision in the left and right abdomen measuring 8mm. These incisions were made approximately 10 cm lateral to the umbilical incision. 8 mm robotic trochars were inserted. The robot was docked.  The abdomen was inspected as was the pelvis.  Pelvic washings were obtained. An incision was made on the right pelvic side wall peritoneum parallel to the IP ligament and the  retroperitoneal space entered. The right ureter was identified and the para-rectal space was developed. A window was created in the right broad ligament above the ureter. The right infundibulopelvic vessels were skeletonized cauterized and transected. The utero-ovarian ligaments similarly were cauterized and transected. Specimen was placed in an Endo Catch bag.  In a similar manner the left peritoneum and the side wall was incised, and the retroperitoneal space entered. The left ureter was identified and the left pararectal space was developed. The utero-ovarian ligament was skeletonized cauterized and transected. The left utero-ovarian ligaments were cauterized and transected in the left adnexa was placed in an Endo Catch bag.  The abdomen was copiously irrigated and drained and all operative sites inspected and hemostasis was assured  The robot was undocked. The camera was placed through the left upper abdominal incision. The contents of the left Endo Catch bag were first aspirated and then morcellated to facilitate removal from the abdominal cavity through the umbilical incision. In a similar fashion the contents of the right Endo Catch bag or morcellated to facilitate removal from the abdominal cavity.  The ports were all remove. The fascial closure at the umbilical incision and left upper quadrant port was made with 0 Vicryl.  All incisions were closed with a running subcuticular Monocryl suture. Dermabond was applied. Sponge, lap and needle counts were correct x 3.    The patient had sequential compression devices for VTE prophylaxis.         Disposition: PACU          Condition: stable  Rossi, Emma Caroline, MD     

## 2019-02-25 NOTE — Anesthesia Preprocedure Evaluation (Addendum)
Anesthesia Evaluation  Patient identified by MRN, date of birth, ID band Patient awake    Reviewed: Allergy & Precautions, NPO status , Patient's Chart, lab work & pertinent test results  History of Anesthesia Complications Negative for: history of anesthetic complications  Airway Mallampati: I  TM Distance: >3 FB Neck ROM: Full    Dental  (+) Teeth Intact   Pulmonary neg pulmonary ROS,    Pulmonary exam normal        Cardiovascular negative cardio ROS Normal cardiovascular exam     Neuro/Psych PSYCHIATRIC DISORDERS Anxiety Depression negative neurological ROS     GI/Hepatic negative GI ROS, Neg liver ROS,   Endo/Other  Hypothyroidism   Renal/GU negative Renal ROS  negative genitourinary   Musculoskeletal  (+) Fibromyalgia -  Abdominal   Peds  Hematology negative hematology ROS (+)   Anesthesia Other Findings H/o breast cancer s/p chemo/XRT BRCA2 positive  Reproductive/Obstetrics                            Anesthesia Physical Anesthesia Plan  ASA: II  Anesthesia Plan: General   Post-op Pain Management:    Induction: Intravenous  PONV Risk Score and Plan: 4 or greater and Ondansetron, Dexamethasone, Treatment may vary due to age or medical condition and Midazolam  Airway Management Planned: Oral ETT  Additional Equipment: None  Intra-op Plan:   Post-operative Plan: Extubation in OR  Informed Consent: I have reviewed the patients History and Physical, chart, labs and discussed the procedure including the risks, benefits and alternatives for the proposed anesthesia with the patient or authorized representative who has indicated his/her understanding and acceptance.     Dental advisory given  Plan Discussed with:   Anesthesia Plan Comments:        Anesthesia Quick Evaluation

## 2019-02-25 NOTE — Anesthesia Postprocedure Evaluation (Signed)
Anesthesia Post Note  Patient: [name]  Procedure(s) Performed: XI ROBOTIC ASSISTED SALPINGO OOPHORECTOMY (Bilateral Abdomen)     Patient location during evaluation: PACU Anesthesia Type: General Level of consciousness: awake and alert Pain management: pain level controlled Vital Signs Assessment: post-procedure vital signs reviewed and stable Respiratory status: spontaneous breathing, nonlabored ventilation and respiratory function stable Cardiovascular status: blood pressure returned to baseline and stable Postop Assessment: no apparent nausea or vomiting Anesthetic complications: no    Last Vitals:  Vitals:   02/25/19 1015 02/25/19 1030  BP: 120/83 (!) 120/92  Pulse: 89 82  Resp: 15 12  Temp:    SpO2: 98% 94%    Last Pain:  Vitals:   02/25/19 1015  TempSrc:   PainSc: 5                  Lidia Collum

## 2019-02-26 ENCOUNTER — Telehealth: Payer: Self-pay

## 2019-02-26 ENCOUNTER — Inpatient Hospital Stay (HOSPITAL_COMMUNITY)
Admission: EM | Admit: 2019-02-26 | Discharge: 2019-03-01 | DRG: 070 | Disposition: A | Discharge status: Home / Self Care | Payer: BLUE CROSS/BLUE SHIELD | Attending: Internal Medicine | Admitting: Internal Medicine

## 2019-02-26 ENCOUNTER — Emergency Department (HOSPITAL_COMMUNITY): Payer: BLUE CROSS/BLUE SHIELD

## 2019-02-26 ENCOUNTER — Other Ambulatory Visit: Payer: Self-pay

## 2019-02-26 DIAGNOSIS — G620 Drug-induced polyneuropathy: Secondary | ICD-10-CM | POA: Diagnosis present

## 2019-02-26 DIAGNOSIS — F329 Major depressive disorder, single episode, unspecified: Secondary | ICD-10-CM | POA: Diagnosis present

## 2019-02-26 DIAGNOSIS — C50911 Malignant neoplasm of unspecified site of right female breast: Secondary | ICD-10-CM | POA: Diagnosis present

## 2019-02-26 DIAGNOSIS — Z8052 Family history of malignant neoplasm of bladder: Secondary | ICD-10-CM

## 2019-02-26 DIAGNOSIS — Z90710 Acquired absence of both cervix and uterus: Secondary | ICD-10-CM

## 2019-02-26 DIAGNOSIS — Z9221 Personal history of antineoplastic chemotherapy: Secondary | ICD-10-CM

## 2019-02-26 DIAGNOSIS — Z20828 Contact with and (suspected) exposure to other viral communicable diseases: Secondary | ICD-10-CM | POA: Diagnosis present

## 2019-02-26 DIAGNOSIS — Z79890 Hormone replacement therapy (postmenopausal): Secondary | ICD-10-CM

## 2019-02-26 DIAGNOSIS — Z79899 Other long term (current) drug therapy: Secondary | ICD-10-CM

## 2019-02-26 DIAGNOSIS — Z923 Personal history of irradiation: Secondary | ICD-10-CM

## 2019-02-26 DIAGNOSIS — J9601 Acute respiratory failure with hypoxia: Secondary | ICD-10-CM | POA: Diagnosis not present

## 2019-02-26 DIAGNOSIS — E785 Hyperlipidemia, unspecified: Secondary | ICD-10-CM | POA: Diagnosis present

## 2019-02-26 DIAGNOSIS — Z8572 Personal history of non-Hodgkin lymphomas: Secondary | ICD-10-CM

## 2019-02-26 DIAGNOSIS — Z1501 Genetic susceptibility to malignant neoplasm of breast: Secondary | ICD-10-CM

## 2019-02-26 DIAGNOSIS — Z79811 Long term (current) use of aromatase inhibitors: Secondary | ICD-10-CM

## 2019-02-26 DIAGNOSIS — G9341 Metabolic encephalopathy: Secondary | ICD-10-CM | POA: Diagnosis not present

## 2019-02-26 DIAGNOSIS — Z800 Family history of malignant neoplasm of digestive organs: Secondary | ICD-10-CM

## 2019-02-26 DIAGNOSIS — R4701 Aphasia: Secondary | ICD-10-CM | POA: Diagnosis present

## 2019-02-26 DIAGNOSIS — Z803 Family history of malignant neoplasm of breast: Secondary | ICD-10-CM

## 2019-02-26 DIAGNOSIS — E039 Hypothyroidism, unspecified: Secondary | ICD-10-CM | POA: Diagnosis present

## 2019-02-26 DIAGNOSIS — Z8249 Family history of ischemic heart disease and other diseases of the circulatory system: Secondary | ICD-10-CM

## 2019-02-26 DIAGNOSIS — Z801 Family history of malignant neoplasm of trachea, bronchus and lung: Secondary | ICD-10-CM

## 2019-02-26 DIAGNOSIS — E079 Disorder of thyroid, unspecified: Secondary | ICD-10-CM | POA: Diagnosis present

## 2019-02-26 DIAGNOSIS — G9340 Encephalopathy, unspecified: Secondary | ICD-10-CM | POA: Diagnosis present

## 2019-02-26 DIAGNOSIS — E872 Acidosis: Secondary | ICD-10-CM | POA: Diagnosis present

## 2019-02-26 DIAGNOSIS — Z7983 Long term (current) use of bisphosphonates: Secondary | ICD-10-CM

## 2019-02-26 DIAGNOSIS — J9602 Acute respiratory failure with hypercapnia: Secondary | ICD-10-CM | POA: Diagnosis present

## 2019-02-26 DIAGNOSIS — M8580 Other specified disorders of bone density and structure, unspecified site: Secondary | ICD-10-CM | POA: Diagnosis present

## 2019-02-26 DIAGNOSIS — N179 Acute kidney failure, unspecified: Secondary | ICD-10-CM | POA: Diagnosis present

## 2019-02-26 DIAGNOSIS — T451X5S Adverse effect of antineoplastic and immunosuppressive drugs, sequela: Secondary | ICD-10-CM

## 2019-02-26 DIAGNOSIS — E869 Volume depletion, unspecified: Secondary | ICD-10-CM | POA: Diagnosis present

## 2019-02-26 DIAGNOSIS — D47Z9 Other specified neoplasms of uncertain behavior of lymphoid, hematopoietic and related tissue: Secondary | ICD-10-CM | POA: Diagnosis present

## 2019-02-26 DIAGNOSIS — R0689 Other abnormalities of breathing: Secondary | ICD-10-CM

## 2019-02-26 DIAGNOSIS — Z170 Estrogen receptor positive status [ER+]: Secondary | ICD-10-CM

## 2019-02-26 DIAGNOSIS — Z806 Family history of leukemia: Secondary | ICD-10-CM

## 2019-02-26 LAB — SURGICAL PATHOLOGY

## 2019-02-26 LAB — CYTOLOGY - NON PAP

## 2019-02-26 NOTE — Telephone Encounter (Signed)
Told Ms Caddell that the pathology showed no cancer or any concerning features per Melissa Cross,NP.

## 2019-02-26 NOTE — Telephone Encounter (Signed)
Ms Klipp states she is doing well. She is not experiencing a lot of pain. Using the Ibuprofen alt. with Tylenol. She is eating, drinking, and urinating well. Passing gas. Using the senokot-s as prescribed. Suggested that she take bid for the next couple of days to help with BM. Incisions are D&I. Pt has post op appointment and knows to call the office at [phone] if she has any questions or concerns.

## 2019-02-26 NOTE — ED Triage Notes (Signed)
Pt reports having an ovaries and fallopian tubes removed yesterday. Pt started getting [facility] today and it has progressively worsened throughout the day. Pt 87% on RA and placed on 2L O2. Pt denies any pain but reports she feels like she is falling asleep mid-sentence.

## 2019-02-26 NOTE — ED Provider Notes (Addendum)
[place] DEPT Provider Note   CSN: [id number] Arrival date & time: 02/26/19  2231     History Chief Complaint  Patient presents with  . Post-op Problem  . Shortness of Breath    [name] is a 58 y.o. female.  HPI     This is a 58 year old female BRCA2 positive with prior history of breast cancer, follicular lymphoma who presents with shortness of breath and altered mental status.  Patient reports that she had bilateral oophorectomy and salpingectomy yesterday.  She reports that she is doing well from a surgical standpoint.  She has minimal abdominal pain and is only taking ibuprofen at home.  She has noted today that she has been very sleepy and falls asleep midsentence.  She denies taking any sedating medications.  She states that she rested comfortably last night.  She has also noted that she has had a hard time catching her breath.  She denies any chest pain.  Has not had any fevers or cough.  Denies any history of complications with anesthesia or difficulty waking from anesthesia.  She reports that the surgery was approximately 1 hour.  Has not noted any lower extremity swelling.  During history taking she was noted to fall asleep but was easily arousable to continue history.  Past Medical History:  Diagnosis Date  . Anxiety   . Arthritis   . BRCA2 gene mutation positive in female   . De Quervain's syndrome (tenosynovitis)    Left wrist  . Depression   . Family history of BRCA2 gene positive   . Fibromyalgia   . Follicular lymphoma grade 3a [facility]) oncologist-- dr Jana Hakim   dx 03-02-2018  s/p left axilly node dissection (NHL),  no further treatment other than active survillance  . History of gout    02-21-2019  per pt has years since last episode  . History of radiation therapy    RIGHT BREAST COMPLETED 12/ 2012  . History of right breast cancer oncologist--  dr Jana Hakim--  no recurrence   dx 04/ 2012--  IDC, Stage IIA, Grade 3,  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);  07-08-2010 s/p breast lumpecotmy w/ node dissection;  completed chemoradiation 12/ 2012  . History of TMJ syndrome   . Hyperlipidemia   . Hypothyroidism   . 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 Osteopenia 08/09/2011  . 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 Personal history of chemotherapy    COMPLETED 12/ 2012  . Personal history of radiation therapy   . Wears glasses     Patient Active Problem List   Diagnosis Date Noted  . Acute respiratory failure with hypoxia and hypercarbia ([place]) 02/27/2019  . 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 Follicular lymphoma ([place]) 03/13/2018  . Lymphoproliferative disorder, low grade B cell (HCC) 02/12/2018  . BRCA2 positive 06/15/2017  . Genetic testing 06/15/2017  . Family history of breast cancer   . Family history of prostate cancer   . Family history of leukemia   . Family history of pancreatic cancer   . Malignant neoplasm of upper-outer quadrant of right breast in female, estrogen receptor positive ([place]) 04/10/2017  . Vaginal dryness 08/19/2015  . Family history of breast cancer in mother 08/19/2015  . Anxiety 07/17/2012  . Osteopenia 08/09/2011  . Thyroid disease   . Breast cancer, BRCA2 positive, right ([place]) 01/11/2011    Past Surgical History:  Procedure Laterality Date  . BREAST EXCISIONAL BIOPSY Left 2019  . BREAST LUMPECTOMY Right 2012  . BREAST LUMPECTOMY WITH AXILLARY LYMPH NODE BIOPSY Right 07/08/2010   W/  PAC INSERTION  . MANDIBLE SURGERY  1991   due to accident as a child. sx to straighten dental bite.  Marland Kitchen PORT-A-CATH REMOVAL  12/28/2010  . RADIOACTIVE SEED GUIDED AXILLARY SENTINEL LYMPH NODE Left 03/02/2018   Procedure: SEED TARGETED DEEP LEFT AXILLARY LYMPH NODE BIOPSY;  Surgeon: [name], MD;  Location: [place];  Service: General;  Laterality: Left;  . ROBOTIC ASSISTED SALPINGO OOPHERECTOMY Bilateral  02/25/2019   Procedure: XI ROBOTIC ASSISTED SALPINGO OOPHORECTOMY;  Surgeon: Everitt Amber, MD;  Location: [facility];  Service: Gynecology;  Laterality: Bilateral;     OB History   No obstetric history on file.     Family History  Problem Relation Age of Onset  . Cancer Maternal Aunt 75       colon - great maternal aunt  . Cancer Paternal Aunt 13       gyn- unsure if ovarian or uterine- met to lung  . Breast cancer Maternal Aunt 60  . Breast cancer Mother 43  . Prostate cancer Paternal Uncle        dx >50  . Heart disease Maternal Grandmother 80  . Pneumonia Maternal Grandfather 46  . Lung cancer Paternal Grandmother 34  . Leukemia Paternal Grandfather 29  . Pancreatic cancer Paternal Aunt 34  . Bladder Cancer Paternal Aunt 40  . Lung cancer Paternal Aunt   . Other Cousin        BRCA2 +  . Breast cancer Cousin        dx <50  . Colon cancer Neg Hx     Social History   Tobacco Use  . Smoking status: Never Smoker  . Smokeless tobacco: Never Used  Substance Use Topics  . Alcohol use: Not Currently  . Drug use: Never    Home Medications Prior to Admission medications   Medication Sig Start Date End Date Taking? Authorizing Provider  alendronate (FOSAMAX) 35 MG tablet Take 1 tablet (35 mg total) by mouth every Wednesday. Take with a full glass of water on an empty stomach. 08/15/18  Yes Magrinat, Virgie Dad, MD  anastrozole (ARIMIDEX) 1 MG tablet Take 1 tablet (1 mg total) by mouth daily. 08/09/18  Yes Magrinat, Virgie Dad, MD  chlorpheniramine (CHLOR-TRIMETON) 4 MG tablet Take 4 mg by mouth daily as needed for allergies.  04/10/17  Yes Magrinat, Virgie Dad, MD  citalopram (CELEXA) 40 MG tablet Take 1 tablet (40 mg total) by mouth daily. 08/09/18  Yes Magrinat, Virgie Dad, MD  fluticasone (FLONASE) 50 MCG/ACT nasal spray Place 1 spray into both nostrils at bedtime.    Yes [provider]  gabapentin (NEURONTIN) 300 MG capsule Take 1 capsule (300 mg total) by  mouth 3 (three) times daily. 08/09/18  Yes Magrinat, Virgie Dad, MD  ibuprofen (ADVIL) 800 MG tablet Take 1 tablet (800 mg total) by mouth every 8 (eight) hours as needed for moderate pain. For AFTER surgery only 12/31/18  Yes Cross, Melissa D, NP  levothyroxine (SYNTHROID, LEVOTHROID) 88 MCG tablet Take 88 mcg by mouth daily before breakfast.    Yes [provider]  oxyCODONE (OXY IR/ROXICODONE) 5 MG immediate release tablet Take 1 tablet (5 mg total) by mouth every 4 (four) hours as needed for severe pain. For AFTER surgery only, do not take and drive 22/63/33  Yes Cross, Melissa D, NP  senna-docusate (SENOKOT-S) 8.6-50 MG tablet Take 2 tablets by mouth at bedtime. For AFTER surgery, do not take if having diarrhea 12/31/18  Yes Cross, Melissa D, NP  meloxicam (MOBIC) 7.5 MG tablet Take 2 tablets (15 mg total) by mouth daily. 08/09/18   Magrinat, Virgie Dad, MD  pravastatin (PRAVACHOL) 40 MG tablet Take 1 tablet (40 mg total) by mouth at bedtime. 08/09/18   Magrinat, Virgie Dad, MD    Allergies    Patient has no known allergies.  Review of Systems   Review of Systems  Constitutional: Positive for fatigue. Negative for fever.  Respiratory: Positive for shortness of breath. Negative for cough.   Cardiovascular: Negative for chest pain.  Gastrointestinal: Negative for abdominal pain, nausea and vomiting.  Genitourinary: Negative for dysuria.  All other systems reviewed and are negative.   Physical Exam Updated Vital Signs BP 140/83   Pulse 81   Temp 98.1 F (36.7 C) (Oral)   Resp 19   Ht 1.626 m ('5\' 4"'$ )   Wt 90.7 kg   SpO2 93%   BMI 34.33 kg/m   Physical Exam Vitals and nursing note reviewed.  Constitutional:      Appearance: She is well-developed. She is obese. She is not ill-appearing.  HENT:     Head: Normocephalic and atraumatic.     Mouth/Throat:     Mouth: Mucous membranes are moist.  Eyes:     Pupils: Pupils are equal, round, and reactive to light.     Comments:  Pupils 3 mm reactive bilaterally  Cardiovascular:     Rate and Rhythm: Normal rate and regular rhythm.     Heart sounds: Normal heart sounds.  Pulmonary:     Effort: Pulmonary effort is normal. No respiratory distress.     Breath sounds: No wheezing.  Abdominal:     General: Bowel sounds are normal.     Palpations: Abdomen is soft.     Comments: 4 abdominal incisions clean dry and intact, no significant tenderness  Musculoskeletal:     Cervical back: Neck supple.     Right lower leg: No tenderness.     Left lower leg: No tenderness.  Skin:    General: Skin is warm and dry.  Neurological:     Mental Status: She is alert and oriented to person, place, and time.  Psychiatric:        Mood and Affect: Mood normal.     ED Results / Procedures / Treatments   Labs (all labs ordered are listed, but only abnormal results are displayed) Labs Reviewed  CBC WITH DIFFERENTIAL/PLATELET - Abnormal; Notable for the following components:      Result Value   WBC 11.5 (*)    Monocytes Absolute 1.2 (*)    All other components within normal limits  BASIC METABOLIC PANEL - Abnormal; Notable for the following components:   Sodium 133 (*)    Chloride 96 (*)    Glucose, Bld 105 (*)    BUN 23 (*)    Creatinine, Ser 1.36 (*)    GFR calc non Af Amer 42 (*)    GFR calc Af Amer 49 (*)    All other components within normal limits  D-DIMER, QUANTITATIVE (NOT AT [facility]) - Abnormal; Notable for the following components:   D-Dimer, Quant 0.61 (*)    All other components within normal limits  BLOOD GAS, ARTERIAL - Abnormal; Notable for the following components:   pH, Arterial 7.285 (*)  pCO2 arterial 58.5 (*)    All other components within normal limits  SARS CORONAVIRUS 2 (TAT 6-24 HRS)    EKG EKG Interpretation  Date/Time:  Tuesday February 26 2019 22:42:06 EST Ventricular Rate:  69 PR Interval:    QRS Duration: 92 QT Interval:  380 QTC Calculation: 408 R Axis:   9 Text  Interpretation: Sinus rhythm Abnormal R-wave progression, early transition Left ventricular hypertrophy Confirmed by Thayer Jew [phone]) on 02/26/2019 11:38:54 PM   Radiology DG Chest 2 View  Result Date: 02/26/2019 CLINICAL DATA:  Shortness of breath, post salpingo-oophorectomy performed yesterday, increasing shortness of breath EXAM: CHEST - 2 VIEW COMPARISON:  Most recent comparison November 11, 2010 FINDINGS: Some low lung volumes with streaky basilar atelectatic changes. No convincing consolidative opacity, pneumothorax or effusion. No suggestive features of edema. Cardiomediastinal contours are stable from prior. Degenerative changes are present in the imaged spine and shoulders. Mild dextrocurvature of the midthoracic spine, similar to priors. Bilateral axillary surgical clips likely reflect prior nodal dissection. No acute osseous or soft tissue abnormality. IMPRESSION: Low lung volumes with streaky bibasilar atelectatic changes. No other acute cardiopulmonary abnormality. Electronically Signed   By: Lovena Le M.D.   On: 02/26/2019 23:22   CT Angio Chest PE W and/or Wo Contrast  Result Date: 02/27/2019 CLINICAL DATA:  Shortness of breath. Salpingo-oophorectomy 2 days ago. EXAM: CT ANGIOGRAPHY CHEST WITH CONTRAST TECHNIQUE: Multidetector CT imaging of the chest was performed using the standard protocol during bolus administration of intravenous contrast. Multiplanar CT image reconstructions and MIPs were obtained to evaluate the vascular anatomy. CONTRAST:  31m OMNIPAQUE IOHEXOL 350 MG/ML SOLN COMPARISON:  Chest radiograph yesterday. Chest CT 02/21/2018 FINDINGS: Cardiovascular: There are no filling defects within the pulmonary arteries to suggest pulmonary embolus. The thoracic aorta is normal in caliber. Mild aortic atherosclerosis. No dissection. However a right subclavian artery courses posterior to the esophagus. Upper normal heart size. No pericardial effusion. Mediastinum/Nodes:  Fluid-filled esophagus. No enlarged mediastinal or hilar lymph nodes. No visualized thyroid nodule. Lungs/Pleura: Imaging obtained in expiration with heterogeneous pulmonary parenchyma. No focal airspace disease. No pleural effusion. No evidence of pulmonary edema. No pulmonary mass. No pulmonary nodule. Upper Abdomen: Fluid-filled distended stomach is partially included. Small foci of free air consistent with recent abdominal surgery. Interposition of colon posterior to the liver. Musculoskeletal: Surgical clips in the right breast and axilla. There are no acute or suspicious osseous abnormalities. Review of the MIP images confirms the above findings. IMPRESSION: 1. No pulmonary embolus. 2. Fluid-filled mid and distal esophagus with dilated fluid-filled stomach, partially included, unknown etiology. No evidence of esophageal or gastric wall thickening. Electronically Signed   By: MKeith RakeM.D.   On: 02/27/2019 02:14    Procedures Procedures (including critical care time)  CRITICAL CARE Performed by: CMerryl Hacker  Total critical care time: 50 minutes  Critical care time was exclusive of separately billable procedures and treating other patients.  Critical care was necessary to treat or prevent imminent or life-threatening deterioration.  Critical care was time spent personally by me on the following activities: development of treatment plan with patient and/or surrogate as well as nursing, discussions with consultants, evaluation of patient's response to treatment, examination of patient, obtaining history from patient or surrogate, ordering and performing treatments and interventions, ordering and review of laboratory studies, ordering and review of radiographic studies, pulse oximetry and re-evaluation of patient's condition.   Medications Ordered in ED Medications  sodium chloride (PF) 0.9 % injection (  has no administration in time range)  sodium chloride 0.9 % bolus 1,000 mL  (1,000 mLs Intravenous New Bag/Given 02/27/19 0116)  iohexol (OMNIPAQUE) 350 MG/ML injection 100 mL (80 mLs Intravenous Contrast Given 02/27/19 0134)  naloxone [facility]) injection 0.4 mg (0.4 mg Intravenous Given 02/27/19 0301)    ED Course  I have reviewed the triage vital signs and the nursing notes.  Pertinent labs & imaging results that were available during my care of the patient were reviewed by me and considered in my medical decision making (see chart for details).  Clinical Course as of Feb 27 432  Wed Feb 27, 2019  0300 Patient still fairly somnolent and falling asleep on history taking.  Feel it is likely related to hypercarbia.  She denies any sedating medications.  She was given Narcan with minimal improvement of her somnolence.  Feel she would benefit from BiPAP to blow off her CO2.  We will trial this for short period of time to see if this helps.  Patient may need observation admission.   [CH]  0424 Patient has been on BiPAP for approximately 30 minutes.  On reassessment, she appears more awake.  She states she feels less sleepy.  Feel she would benefit from observation to ensure correction of her hypercarbia.  At this time, my only etiology for her hypercarbia would be prolonged sensitivity to anesthesia.  She did not respond to Narcan and seems reliable about her medications.   [CH]    Clinical Course User Index [CH] Teeghan Hammer, Barbette Hair, MD   MDM Rules/Calculators/A&P                      Patient presents with somnolence and shortness of breath post anesthesia and surgery approximately 36 hours ago.  She is somnolent and falls asleep intermittently on history taking but is easily arousable.  Describes some shortness of breath but more arousing and feeling like she has to catch her breath.  No chest pain.  She denies any sedating medications.  Basic lab work same.  I did obtain an ABG to assess for hypercarbia although she has no risk factors for hypercarbia.  She does have a  respiratory acidosis with a pH of 7.28 and a PCO2 of 58.  She has a slight AKI.  Patient was given fluids on multiple rechecks she remains arousable but fairly somnolent and falls asleep.  I did attempt to give her Narcan to see if this helped her mental status.  This did not have any effect.  Do not feel a UDS would be helpful as she likely received some form of narcotic during her operation.  Additionally, I cannot review her anesthesia record to see what medication she received.  She had a negative Covid test 4 days ago.  For this reason, I felt she was low risk placed on BiPAP to blow off some of her CO2.  She may be highly sensitive to anesthesia and with slight AKI have some residual effects from anesthesia although she is somewhat removed from her surgery at this time.  With reassessment she is improved on BiPAP.  Will admit for observation to ensure that her mental status continues to improve and her hypercarbia resolves.   Final Clinical Impression(s) / ED Diagnoses Final diagnoses:  Hypercarbia  AKI (acute kidney injury) [facility])    Rx / DC Orders ED Discharge Orders    None       Taquisha Phung, Barbette Hair, MD 02/27/19 [id number]  Merryl Hacker, MD 02/27/19 [id number]

## 2019-02-27 ENCOUNTER — Observation Stay (HOSPITAL_COMMUNITY): Payer: BLUE CROSS/BLUE SHIELD

## 2019-02-27 ENCOUNTER — Emergency Department (HOSPITAL_COMMUNITY): Payer: BLUE CROSS/BLUE SHIELD

## 2019-02-27 ENCOUNTER — Telehealth: Payer: Self-pay | Admitting: Gynecologic Oncology

## 2019-02-27 ENCOUNTER — Encounter (HOSPITAL_COMMUNITY): Payer: Self-pay

## 2019-02-27 DIAGNOSIS — J9601 Acute respiratory failure with hypoxia: Secondary | ICD-10-CM | POA: Diagnosis present

## 2019-02-27 DIAGNOSIS — J9602 Acute respiratory failure with hypercapnia: Secondary | ICD-10-CM

## 2019-02-27 DIAGNOSIS — G9340 Encephalopathy, unspecified: Secondary | ICD-10-CM | POA: Diagnosis present

## 2019-02-27 DIAGNOSIS — N179 Acute kidney failure, unspecified: Secondary | ICD-10-CM | POA: Diagnosis not present

## 2019-02-27 HISTORY — DX: Acute respiratory failure with hypoxia: J96.01

## 2019-02-27 HISTORY — DX: Encephalopathy, unspecified: G93.40

## 2019-02-27 LAB — BLOOD GAS, ARTERIAL
Acid-base deficit: 0.2 mmol/L (ref 0.0–2.0)
Acid-base deficit: 0.6 mmol/L (ref 0.0–2.0)
Bicarbonate: 25.3 mmol/L (ref 20.0–28.0)
Bicarbonate: 27.1 mmol/L (ref 20.0–28.0)
Drawn by: 270211
FIO2: 28
O2 Saturation: 96.5 %
O2 Saturation: 98.7 %
Patient temperature: 97.7
Patient temperature: 98.6
pCO2 arterial: 49.6 mmHg — ABNORMAL HIGH (ref 32.0–48.0)
pCO2 arterial: 58.5 mmHg — ABNORMAL HIGH (ref 32.0–48.0)
pH, Arterial: 7.285 — ABNORMAL LOW (ref 7.350–7.450)
pH, Arterial: 7.327 — ABNORMAL LOW (ref 7.350–7.450)
pO2, Arterial: 108 mmHg (ref 83.0–108.0)
pO2, Arterial: 86.7 mmHg (ref 83.0–108.0)

## 2019-02-27 LAB — CBC WITH DIFFERENTIAL/PLATELET
Abs Immature Granulocytes: 0.04 10*3/uL (ref 0.00–0.07)
Basophils Absolute: 0.1 10*3/uL (ref 0.0–0.1)
Basophils Relative: 1 %
Eosinophils Absolute: 0.3 10*3/uL (ref 0.0–0.5)
Eosinophils Relative: 3 %
HCT: 39.1 % (ref 36.0–46.0)
Hemoglobin: 12.7 g/dL (ref 12.0–15.0)
Immature Granulocytes: 0 %
Lymphocytes Relative: 21 %
Lymphs Abs: 2.4 10*3/uL (ref 0.7–4.0)
MCH: 30.2 pg (ref 26.0–34.0)
MCHC: 32.5 g/dL (ref 30.0–36.0)
MCV: 93.1 fL (ref 80.0–100.0)
Monocytes Absolute: 1.2 10*3/uL — ABNORMAL HIGH (ref 0.1–1.0)
Monocytes Relative: 10 %
Neutro Abs: 7.4 10*3/uL (ref 1.7–7.7)
Neutrophils Relative %: 65 %
Platelets: 172 10*3/uL (ref 150–400)
RBC: 4.2 MIL/uL (ref 3.87–5.11)
RDW: 13.2 % (ref 11.5–15.5)
WBC: 11.5 10*3/uL — ABNORMAL HIGH (ref 4.0–10.5)
nRBC: 0 % (ref 0.0–0.2)

## 2019-02-27 LAB — HEPATIC FUNCTION PANEL
ALT: 16 U/L (ref 0–44)
AST: 20 U/L (ref 15–41)
Albumin: 3.6 g/dL (ref 3.5–5.0)
Alkaline Phosphatase: 62 U/L (ref 38–126)
Bilirubin, Direct: 0.1 mg/dL (ref 0.0–0.2)
Indirect Bilirubin: 0.6 mg/dL (ref 0.3–0.9)
Total Bilirubin: 0.7 mg/dL (ref 0.3–1.2)
Total Protein: 6.2 g/dL — ABNORMAL LOW (ref 6.5–8.1)

## 2019-02-27 LAB — RAPID URINE DRUG SCREEN, HOSP PERFORMED
Amphetamines: NOT DETECTED
Barbiturates: NOT DETECTED
Benzodiazepines: NOT DETECTED
Cocaine: NOT DETECTED
Opiates: NOT DETECTED
Tetrahydrocannabinol: NOT DETECTED

## 2019-02-27 LAB — URINALYSIS, ROUTINE W REFLEX MICROSCOPIC
Bilirubin Urine: NEGATIVE
Glucose, UA: NEGATIVE mg/dL
Hgb urine dipstick: NEGATIVE
Ketones, ur: NEGATIVE mg/dL
Leukocytes,Ua: NEGATIVE
Nitrite: NEGATIVE
Protein, ur: NEGATIVE mg/dL
Specific Gravity, Urine: 1.014 (ref 1.005–1.030)
pH: 5 (ref 5.0–8.0)

## 2019-02-27 LAB — GLUCOSE, CAPILLARY
Glucose-Capillary: 90 mg/dL (ref 70–99)
Glucose-Capillary: 87 mg/dL (ref 70–99)
Glucose-Capillary: 85 mg/dL (ref 70–99)

## 2019-02-27 LAB — BASIC METABOLIC PANEL
Anion gap: 11 (ref 5–15)
BUN: 23 mg/dL — ABNORMAL HIGH (ref 6–20)
CO2: 26 mmol/L (ref 22–32)
Calcium: 9.2 mg/dL (ref 8.9–10.3)
Chloride: 96 mmol/L — ABNORMAL LOW (ref 98–111)
Creatinine, Ser: 1.36 mg/dL — ABNORMAL HIGH (ref 0.44–1.00)
GFR calc Af Amer: 49 mL/min — ABNORMAL LOW (ref >60)
GFR calc non Af Amer: 42 mL/min — ABNORMAL LOW (ref >60)
Glucose, Bld: 105 mg/dL — ABNORMAL HIGH (ref 70–99)
Potassium: 4 mmol/L (ref 3.5–5.1)
Sodium: 133 mmol/L — ABNORMAL LOW (ref 135–145)

## 2019-02-27 LAB — D-DIMER, QUANTITATIVE: D-Dimer, Quant: 0.61 ug/mL-FEU — ABNORMAL HIGH (ref 0.00–0.50)

## 2019-02-27 LAB — AMMONIA: Ammonia: 31 umol/L (ref 9–35)

## 2019-02-27 LAB — TSH: TSH: 0.929 u[IU]/mL (ref 0.350–4.500)

## 2019-02-27 LAB — SARS CORONAVIRUS 2 (TAT 6-24 HRS): SARS Coronavirus 2: NEGATIVE

## 2019-02-27 MED ORDER — SODIUM CHLORIDE (PF) 0.9 % IJ SOLN
INTRAMUSCULAR | Status: AC
  Filled 2019-02-27: qty 50

## 2019-02-27 MED ORDER — IOHEXOL 300 MG/ML  SOLN
75.0000 mL | Freq: Once | INTRAMUSCULAR | Status: AC | PRN
  Administered 2019-02-27: 75 mL via INTRAVENOUS

## 2019-02-27 MED ORDER — ONDANSETRON HCL 4 MG/2ML IJ SOLN: 4.0000 mg | Freq: Four times a day (QID) | INTRAMUSCULAR | Status: DC | PRN

## 2019-02-27 MED ORDER — ACETAMINOPHEN 650 MG RE SUPP: 650.0000 mg | Freq: Four times a day (QID) | RECTAL | Status: DC | PRN

## 2019-02-27 MED ORDER — CHLORHEXIDINE GLUCONATE CLOTH 2 % EX PADS
6.0000 | MEDICATED_PAD | Freq: Every day | CUTANEOUS | Status: DC
  Administered 2019-02-27 – 2019-03-01 (×3): 6 via TOPICAL

## 2019-02-27 MED ORDER — IOHEXOL 9 MG/ML PO SOLN
500.0000 mL | ORAL | Status: AC
  Administered 2019-02-27 (×2): 500 mL via ORAL

## 2019-02-27 MED ORDER — SODIUM CHLORIDE 0.9 % IV SOLN: INTRAVENOUS | Status: AC

## 2019-02-27 MED ORDER — LEVOTHYROXINE SODIUM 88 MCG PO TABS
88.0000 ug | ORAL_TABLET | Freq: Every day | ORAL | Status: DC
  Administered 2019-02-28 – 2019-03-01 (×2): 88 ug via ORAL
  Filled 2019-02-27 (×2): qty 1

## 2019-02-27 MED ORDER — SODIUM CHLORIDE 0.9 % IV BOLUS
1000.0000 mL | Freq: Once | INTRAVENOUS | Status: AC
  Administered 2019-02-27: 1000 mL via INTRAVENOUS

## 2019-02-27 MED ORDER — LEVOTHYROXINE SODIUM 100 MCG/5ML IV SOLN
44.0000 ug | Freq: Every day | INTRAVENOUS | Status: DC
  Administered 2019-02-27: 44 ug via INTRAVENOUS
  Filled 2019-02-27: qty 5

## 2019-02-27 MED ORDER — IOHEXOL 350 MG/ML SOLN
100.0000 mL | Freq: Once | INTRAVENOUS | Status: AC | PRN
  Administered 2019-02-27: 80 mL via INTRAVENOUS

## 2019-02-27 MED ORDER — HEPARIN SODIUM (PORCINE) 5000 UNIT/ML IJ SOLN
5000.0000 [IU] | Freq: Three times a day (TID) | INTRAMUSCULAR | Status: DC
  Administered 2019-02-27 – 2019-02-28 (×5): 5000 [IU] via SUBCUTANEOUS
  Filled 2019-02-27 (×5): qty 1

## 2019-02-27 MED ORDER — IOHEXOL 9 MG/ML PO SOLN
ORAL | Status: AC
  Filled 2019-02-27: qty 1000

## 2019-02-27 MED ORDER — ACETAMINOPHEN 325 MG PO TABS: 650.0000 mg | ORAL_TABLET | Freq: Four times a day (QID) | ORAL | Status: DC | PRN

## 2019-02-27 MED ORDER — ONDANSETRON HCL 4 MG PO TABS: 4.0000 mg | ORAL_TABLET | Freq: Four times a day (QID) | ORAL | Status: DC | PRN

## 2019-02-27 MED ORDER — NALOXONE HCL 0.4 MG/ML IJ SOLN
0.4000 mg | Freq: Once | INTRAMUSCULAR | Status: AC
  Administered 2019-02-27: 0.4 mg via INTRAVENOUS
  Filled 2019-02-27: qty 1

## 2019-02-27 NOTE — ED Notes (Signed)
ED TO INPATIENT HANDOFF REPORT  Name/Age/Gender [name] 58 y.o. female  Code Status Code Status History    Date Active Date Inactive Code Status Order ID Comments User Context   02/25/2019 0628 02/25/2019 1528 Full Code [id number]  Dorothyann Gibbs, NP Inpatient   Advance Care Planning Activity    Advance Directive Documentation     Most Recent Value  Type of Advance Directive  Healthcare Power of [place]  Pre-existing out of facility DNR order (yellow form or pink MOST form)  --  "MOST" Form in Place?  --      Home/SNF/Other Home  Chief Complaint Acute respiratory failure with hypoxia and hypercarbia ([place]) [J96.01, J96.02]  Level of Care/Admitting Diagnosis ED Disposition    ED Disposition Condition [place]: [place] [100102]  Level of Care: Stepdown [14]  Admit to SDU based on following criteria: Severe physiological/psychological symptoms:  Any diagnosis requiring assessment & intervention at least every 4 hours on an ongoing basis to obtain desired patient outcomes including stability and rehabilitation  Covid Evaluation: Asymptomatic Screening Protocol (No Symptoms)  Diagnosis: Acute respiratory failure with hypoxia and hypercarbia [facility]) [[id number]]  Admitting Physician: Rise Patience [phone]  Attending Physician: Rise Patience [phone]       Medical History Past Medical History:  Diagnosis Date  . Anxiety   . Arthritis   . BRCA2 gene mutation positive in female   . De Quervain's syndrome (tenosynovitis)    Left wrist  . Depression   . Family history of BRCA2 gene positive   . Fibromyalgia   . Follicular lymphoma grade 3a [facility]) oncologist-- dr Jana Hakim   dx 03-02-2018  s/p left axilly node dissection (NHL),  no further treatment other than active survillance  . History of gout    02-21-2019  per pt has years since last episode  . History of radiation therapy    RIGHT BREAST COMPLETED 12/ 2012   . 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 History of TMJ syndrome   . Hyperlipidemia   . Hypothyroidism   . 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 Osteopenia 08/09/2011  . 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 Personal history of chemotherapy    COMPLETED 12/ 2012  . Personal history of radiation therapy   . Wears glasses     Allergies No Known Allergies  IV Location/Drains/Wounds Patient Lines/Drains/Airways Status   Active Line/Drains/Airways    Name:   Placement date:   Placement time:   Site:   Days:   Peripheral IV 02/26/19 Left;Medial Forearm   02/26/19    2355    Forearm   1   Incision (Closed) 03/02/18 Axilla Left   03/02/18    1229     362   Incision (Closed) 02/25/19 Abdomen   02/25/19    0908     2   Incision (Closed) 02/25/19 Vagina   02/25/19    0908     2   Incision - 4 Ports Abdomen Umbilicus Right Left;Medial Left;Mid;Distal   02/25/19    9937     2          Labs/Imaging Results for orders placed or performed during  the hospital encounter of 02/26/19 (from the past 48 hour(s))  CBC with Differential     Status: Abnormal   Collection Time: 02/26/19 11:36 PM  Result Value Ref Range   WBC 11.5 (H) 4.0 - 10.5 K/uL   RBC 4.20 3.87 - 5.11 MIL/uL   Hemoglobin 12.7 12.0 - 15.0 g/dL   HCT 39.1 36.0 - 46.0 %   MCV 93.1 80.0 - 100.0 fL   MCH 30.2 26.0 - 34.0 pg   MCHC 32.5 30.0 - 36.0 g/dL   RDW 13.2 11.5 - 15.5 %   Platelets 172 150 - 400 K/uL   nRBC 0.0 0.0 - 0.2 %   Neutrophils Relative % 65 %   Neutro Abs 7.4 1.7 - 7.7 K/uL   Lymphocytes Relative 21 %   Lymphs Abs 2.4 0.7 - 4.0 K/uL   Monocytes Relative 10 %   Monocytes Absolute 1.2 (H) 0.1 - 1.0 K/uL   Eosinophils Relative 3 %   Eosinophils Absolute 0.3 0.0 - 0.5  K/uL   Basophils Relative 1 %   Basophils Absolute 0.1 0.0 - 0.1 K/uL   Immature Granulocytes 0 %   Abs Immature Granulocytes 0.04 0.00 - 0.07 K/uL    Comment: Performed at [facility], [place] [address], [place], [place] [address]  Basic metabolic panel     Status: Abnormal   Collection Time: 02/26/19 11:36 PM  Result Value Ref Range   Sodium 133 (L) 135 - 145 mmol/L   Potassium 4.0 3.5 - 5.1 mmol/L   Chloride 96 (L) 98 - 111 mmol/L   CO2 26 22 - 32 mmol/L   Glucose, Bld 105 (H) 70 - 99 mg/dL   BUN 23 (H) 6 - 20 mg/dL   Creatinine, Ser 1.36 (H) 0.44 - 1.00 mg/dL   Calcium 9.2 8.9 - 10.3 mg/dL   GFR calc non Af Amer 42 (L) >60 mL/min   GFR calc Af Amer 49 (L) >60 mL/min   Anion gap 11 5 - 15    Comment: Performed at [facility], [place] [address], [place], [place] [address]  D-dimer, quantitative (not at [facility])     Status: Abnormal   Collection Time: 02/26/19 11:36 PM  Result Value Ref Range   D-Dimer, Quant 0.61 (H) 0.00 - 0.50 ug/mL-FEU    Comment: (NOTE) At the manufacturer cut-off of 0.50 ug/mL FEU, this assay has been documented to exclude PE with a sensitivity and negative predictive value of 97 to 99%.  At this time, this assay has not been approved by the FDA to exclude DVT/VTE. Results should be correlated with clinical presentation. Performed at [facility], [place] [address], [place], [place] [address]   Blood gas, arterial (at [facility])     Status: Abnormal   Collection Time: 02/26/19 11:36 PM  Result Value Ref Range   pH, Arterial 7.285 (L) 7.350 - 7.450   pCO2 arterial 58.5 (H) 32.0 - 48.0 mmHg   pO2, Arterial 108 83.0 - 108.0 mmHg   Bicarbonate 27.1 20.0 - 28.0 mmol/L   Acid-base deficit 0.2 0.0 - 2.0 mmol/L   O2 Saturation 98.7 %   Patient temperature 97.7    Allens test (pass/fail) PASS PASS    Comment: Performed at [facility], [place] [address], [place], [place] [address]   DG Chest 2  View  Result Date: 02/26/2019 CLINICAL DATA:  Shortness of breath, post salpingo-oophorectomy performed yesterday, increasing shortness of breath EXAM: CHEST -  2 VIEW COMPARISON:  Most recent comparison November 11, 2010 FINDINGS: Some low lung volumes with streaky basilar atelectatic changes. No convincing consolidative opacity, pneumothorax or effusion. No suggestive features of edema. Cardiomediastinal contours are stable from prior. Degenerative changes are present in the imaged spine and shoulders. Mild dextrocurvature of the midthoracic spine, similar to priors. Bilateral axillary surgical clips likely reflect prior nodal dissection. No acute osseous or soft tissue abnormality. IMPRESSION: Low lung volumes with streaky bibasilar atelectatic changes. No other acute cardiopulmonary abnormality. Electronically Signed   By: Lovena Le M.D.   On: 02/26/2019 23:22   CT Angio Chest PE W and/or Wo Contrast  Result Date: 02/27/2019 CLINICAL DATA:  Shortness of breath. Salpingo-oophorectomy 2 days ago. EXAM: CT ANGIOGRAPHY CHEST WITH CONTRAST TECHNIQUE: Multidetector CT imaging of the chest was performed using the standard protocol during bolus administration of intravenous contrast. Multiplanar CT image reconstructions and MIPs were obtained to evaluate the vascular anatomy. CONTRAST:  59m OMNIPAQUE IOHEXOL 350 MG/ML SOLN COMPARISON:  Chest radiograph yesterday. Chest CT 02/21/2018 FINDINGS: Cardiovascular: There are no filling defects within the pulmonary arteries to suggest pulmonary embolus. The thoracic aorta is normal in caliber. Mild aortic atherosclerosis. No dissection. However a right subclavian artery courses posterior to the esophagus. Upper normal heart size. No pericardial effusion. Mediastinum/Nodes: Fluid-filled esophagus. No enlarged mediastinal or hilar lymph nodes. No visualized thyroid nodule. Lungs/Pleura: Imaging obtained in expiration with heterogeneous pulmonary parenchyma. No focal  airspace disease. No pleural effusion. No evidence of pulmonary edema. No pulmonary mass. No pulmonary nodule. Upper Abdomen: Fluid-filled distended stomach is partially included. Small foci of free air consistent with recent abdominal surgery. Interposition of colon posterior to the liver. Musculoskeletal: Surgical clips in the right breast and axilla. There are no acute or suspicious osseous abnormalities. Review of the MIP images confirms the above findings. IMPRESSION: 1. No pulmonary embolus. 2. Fluid-filled mid and distal esophagus with dilated fluid-filled stomach, partially included, unknown etiology. No evidence of esophageal or gastric wall thickening. Electronically Signed   By: MKeith RakeM.D.   On: 02/27/2019 02:14    Pending Labs Unresulted Labs (From admission, onward)    Start     Ordered   02/27/19 0035  SARS CORONAVIRUS 2 (TAT 6-24 HRS) Nasopharyngeal Nasopharyngeal Swab  (Tier 3 (TAT 6-24 hrs))  Once,   STAT    Question Answer Comment  Is this test for diagnosis or screening Screening   Symptomatic for COVID-19 as defined by CDC No   Hospitalized for COVID-19 No   Admitted to ICU for COVID-19 No   Previously tested for COVID-19 Yes   Resident in a congregate (group) care setting No   Employed in healthcare setting No   Pregnant No      02/27/19 0035          Vitals/Pain Today's Vitals   02/26/19 2246 02/26/19 2247 02/27/19 0222 02/27/19 0301  BP:   113/81 140/83  Pulse:   98 81  Resp:   13 19  Temp:      TempSrc:      SpO2:  97% 98% 93%  Weight:      Height:      PainSc: 0-No pain       Isolation Precautions No active isolations  Medications Medications  sodium chloride (PF) 0.9 % injection (has no administration in time range)  sodium chloride 0.9 % bolus 1,000 mL (1,000 mLs Intravenous New Bag/Given 02/27/19 0116)  iohexol (OMNIPAQUE) 350 MG/ML injection 100 mL (80  mLs Intravenous Contrast Given 02/27/19 0134)  naloxone [facility]) injection 0.4 mg  (0.4 mg Intravenous Given 02/27/19 0301)    Mobility walks

## 2019-02-27 NOTE — Progress Notes (Signed)
PROGRESS NOTE    [name]  MWU:[id number] DOB: 12-09-1960 DOA: 02/26/2019 PCP: [name], MD    Brief Narrative:  58 year old Caucasian female with history of breast cancer, non-Hodgkin's lymphoma, recent bilateral oophorectomy/hysterectomy on 02/25/2019 admitted for somnolence   Assessment & Plan:   Principal Problem:   Acute encephalopathy Active Problems:   Thyroid disease   Lymphoproliferative disorder, low grade B cell (HCC)   Acute respiratory failure with hypoxia and hypercarbia (HCC)   AKI (acute kidney injury) ([place])  Acute encephalopathy Somnolent, delayed response but oriented and following commands appropriately Etiology unclear at this time.  Most likely in setting of carbon dioxide retention.  Though etiology for carbon dioxide narcosis is not clear. ?Gabapentin in setting of AKI, anesthesia effect.  Patient denies taking narcotics.  CT head negative for any acute intracranial abnormality.  CT abdomen pelvis ordered by signout to rule out postsurgical complications negative.  Mentation improved briefly following BiPAP. Gyn onc on board.  Appreciate recommendations. BiPAP intermittently for hypercarbia MRI brain to rule out posterior circulation stroke  Acute hypercapnic respiratory failure Hypoxia on presentation has resolved.  CTA chest negative for PE.  Bibasilar atelectasis present. Initial ABG pH 7.2, PCO2 58.  After being off BiPAP for some time, ABG pH 7.3, PCO2 49 ?  Elevated AA gradient  AKI Likely prerenal.  Continue IV fluids Repeat BMP tomorrow a.m.  Hypothyroidism Continue Synthroid.  Since she is awake and able to take p.o., switch from IV to p.o.  History of breast cancer non-Hodgkin's lymphoma With recent history of hysterectomy and bilateral oophorectomy as prophylaxis for BRCA gene mutation Followed by Dr. Jana Hakim  DVT prophylaxis: Heparin SQ Code Status: Full code  Family Communication: Discussed plan with the  patient Disposition Plan: Discharge home once medically stable   Consultants:   Gynecologic oncology  Procedures:  None  Antimicrobials:   None   Subjective: Patient seen and examined this morning.  She had come off BiPAP and was awake, oriented.  Able to communicate but with delayed response.  She again tells me did not take narcotics.  Feels better but still is cloudy and does not feel herself.  She is doing fine since discharge from the hospital on the 14th and started feeling bad yesterday evening. On subsequent follow-up visit this afternoon, she had walked to the bathroom with no difficulty per staff.  She reports feeling like she could eat something but still does not feel herself.  Denies any blurred vision or focal weakness but reports feeling dizzy since yesterday.  Objective: Vitals:   02/27/19 0700 02/27/19 0743 02/27/19 0800 02/27/19 0900  BP: 107/72  110/68 117/71  Pulse: 78  84 85  Resp: 14  (!) 23 17  Temp:   98 F (36.7 C)   TempSrc:   Oral   SpO2: 91% 94% 98% 98%  Weight:      Height:        Intake/Output Summary (Last 24 hours) at 02/27/2019 0951 Last data filed at 02/27/2019 0936 Gross per 24 hour  Intake 254.45 ml  Output --  Net 254.45 ml   Filed Weights   02/26/19 2237  Weight: 90.7 kg    Examination:  General exam: Appears calm and comfortable  Respiratory system: Clear to auscultation. Respiratory effort normal. Cardiovascular system: S1 & S2 heard, RRR. No JVD. No pedal edema. Gastrointestinal system: Abdomen is nondistended, soft and nontender. No organomegaly or masses felt. Normal bowel sounds heard. Central nervous system: Intermittently somnolent,  oriented. No focal neurological deficits.  Strength intact in all extremities.  Pupils small but equal and reactive to light.  Able to perform finger-to-nose test bilaterally.  Cranial nerves II to XII grossly intact.  Face is symmetric. Extremities: Symmetric 5 x 5 power. Skin: No  rashes, lesions or ulcers Psychiatry: Judgement and insight appear normal. Mood & affect appropriate.   Data Reviewed: I have personally reviewed following labs and imaging studies  CBC: Recent Labs  Lab 02/21/19 1200 02/26/19 2336  WBC 6.3 11.5*  NEUTROABS  --  7.4  HGB 14.2 12.7  HCT 43.6 39.1  MCV 90.6 93.1  PLT 164 826   Basic Metabolic Panel: Recent Labs  Lab 02/26/19 2336  NA 133*  K 4.0  CL 96*  CO2 26  GLUCOSE 105*  BUN 23*  CREATININE 1.36*  CALCIUM 9.2   GFR: Estimated Creatinine Clearance: 48.6 mL/min (A) (by C-G formula based on SCr of 1.36 mg/dL (H)). Liver Function Tests: No results for input(s): AST, ALT, ALKPHOS, BILITOT, PROT, ALBUMIN in the last 168 hours. No results for input(s): LIPASE, AMYLASE in the last 168 hours. Recent Labs  Lab 02/27/19 0750  AMMONIA 31   Coagulation Profile: No results for input(s): INR, PROTIME in the last 168 hours. Cardiac Enzymes: No results for input(s): CKTOTAL, CKMB, CKMBINDEX, TROPONINI in the last 168 hours. BNP (last 3 results) No results for input(s): PROBNP in the last 8760 hours. HbA1C: No results for input(s): HGBA1C in the last 72 hours. CBG: Recent Labs  Lab 02/27/19 0614  GLUCAP 90   Lipid Profile: No results for input(s): CHOL, HDL, LDLCALC, TRIG, CHOLHDL, LDLDIRECT in the last 72 hours. Thyroid Function Tests: Recent Labs    02/27/19 0750  TSH 0.929   Anemia Panel: No results for input(s): VITAMINB12, FOLATE, FERRITIN, TIBC, IRON, RETICCTPCT in the last 72 hours. Sepsis Labs: No results for input(s): PROCALCITON, LATICACIDVEN in the last 168 hours.  Recent Results (from the past 240 hour(s))  Novel Coronavirus, NAA (Hosp order, Send-out to Ref Lab; TAT 18-24 hrs     Status: None   Collection Time: 02/21/19  4:17 PM   Specimen: Nasopharyngeal Swab; Respiratory  Result Value Ref Range Status   SARS-CoV-2, NAA NOT DETECTED NOT DETECTED Final    Comment: (NOTE) This nucleic acid  amplification test was developed and its performance characteristics determined by [employer]. Nucleic acid amplification tests include PCR and TMA. This test has not been FDA cleared or approved. This test has been authorized by FDA under an Emergency Use Authorization (EUA). This test is only authorized for the duration of time the declaration that circumstances exist justifying the authorization of the emergency use of in vitro diagnostic tests for detection of SARS-CoV-2 virus and/or diagnosis of COVID-19 infection under section 564(b)(1) of the Act, 21 U.S.C. 415AXE-9(M) (1), unless the authorization is terminated or revoked sooner. When diagnostic testing is negative, the possibility of a false negative result should be considered in the context of a patient's recent exposures and the presence of clinical signs and symptoms consistent with COVID-19. An individual without symptoms of COVID- 19 and who is not shedding SARS-CoV-2 vi rus would expect to have a negative (not detected) result in this assay. Performed At: [facility] [address] [place], [place] [id number] Rush Farmer MD SR:[id number]    [place]  Final    Comment: Performed at [facility], [place] [address] [place], [place] [address]  Radiology Studies: DG Chest 2 View  Result Date: 02/26/2019 CLINICAL DATA:  Shortness of breath, post salpingo-oophorectomy performed yesterday, increasing shortness of breath EXAM: CHEST - 2 VIEW COMPARISON:  Most recent comparison November 11, 2010 FINDINGS: Some low lung volumes with streaky basilar atelectatic changes. No convincing consolidative opacity, pneumothorax or effusion. No suggestive features of edema. Cardiomediastinal contours are stable from prior. Degenerative changes are present in the imaged spine and shoulders. Mild dextrocurvature of the midthoracic spine, similar to priors. Bilateral axillary surgical  clips likely reflect prior nodal dissection. No acute osseous or soft tissue abnormality. IMPRESSION: Low lung volumes with streaky bibasilar atelectatic changes. No other acute cardiopulmonary abnormality. Electronically Signed   By: Lovena Le M.D.   On: 02/26/2019 23:22   CT Angio Chest PE W and/or Wo Contrast  Result Date: 02/27/2019 CLINICAL DATA:  Shortness of breath. Salpingo-oophorectomy 2 days ago. EXAM: CT ANGIOGRAPHY CHEST WITH CONTRAST TECHNIQUE: Multidetector CT imaging of the chest was performed using the standard protocol during bolus administration of intravenous contrast. Multiplanar CT image reconstructions and MIPs were obtained to evaluate the vascular anatomy. CONTRAST:  70m OMNIPAQUE IOHEXOL 350 MG/ML SOLN COMPARISON:  Chest radiograph yesterday. Chest CT 02/21/2018 FINDINGS: Cardiovascular: There are no filling defects within the pulmonary arteries to suggest pulmonary embolus. The thoracic aorta is normal in caliber. Mild aortic atherosclerosis. No dissection. However a right subclavian artery courses posterior to the esophagus. Upper normal heart size. No pericardial effusion. Mediastinum/Nodes: Fluid-filled esophagus. No enlarged mediastinal or hilar lymph nodes. No visualized thyroid nodule. Lungs/Pleura: Imaging obtained in expiration with heterogeneous pulmonary parenchyma. No focal airspace disease. No pleural effusion. No evidence of pulmonary edema. No pulmonary mass. No pulmonary nodule. Upper Abdomen: Fluid-filled distended stomach is partially included. Small foci of free air consistent with recent abdominal surgery. Interposition of colon posterior to the liver. Musculoskeletal: Surgical clips in the right breast and axilla. There are no acute or suspicious osseous abnormalities. Review of the MIP images confirms the above findings. IMPRESSION: 1. No pulmonary embolus. 2. Fluid-filled mid and distal esophagus with dilated fluid-filled stomach, partially included, unknown  etiology. No evidence of esophageal or gastric wall thickening. Electronically Signed   By: MKeith RakeM.D.   On: 02/27/2019 02:14        Scheduled Meds: . Chlorhexidine Gluconate Cloth  6 each Topical Daily  . heparin  5,000 Units Subcutaneous Q8H  . [START ON 02/28/2019] levothyroxine  88 mcg Oral QAC breakfast  . sodium chloride (PF)       Continuous Infusions: . sodium chloride 75 mL/hr at 02/27/19 0936     LOS: 0 days    Time spent: Spent more than 30 minutes in coordinating care for this patient including bedside patient care.    JLucky Cowboy MD [facility] If 7PM-7AM, please contact night-coverage 02/27/2019, 9:51 AM

## 2019-02-27 NOTE — Progress Notes (Addendum)
Rt took pt off BIPAP and placed on 3 LPM [place]. No distress noted at this time.

## 2019-02-27 NOTE — Progress Notes (Signed)
Nurse accompanied patient via wheelchair to MRI at 2040.

## 2019-02-27 NOTE — Consult Note (Signed)
GYNECOLOGIC ONCOLOGY INPATIENT CONSULTATION  Date of Service: 02/27/19 Requesting Service: [facility] Consulting Provider: Jeral Pinch, MD   HISTORY OF PRESENT ILLNESS: [name] is a 58 y.o. woman who is seen in consultation at the request of Dr. Hal Hope for evaluation of neurologic symptoms and hypercarbia in the postoperative setting.  The patient underwent an uncomplicated robotic bilateral salpingo-oophorectomy on 12/14 in the setting of a BRCA2 germline mutation.  Intra-abdominal findings at the time of surgery were unremarkable and the surgery itself was uncomplicated.  The patient did well immediately postoperative fully and was discharged home on the same day.  She was contacted by our office yesterday, the 15th, and reported tolerating p.o. intake, voiding and having flatus.  This morning, the patient reports that she spoke with our office in the mid afternoon when she was taking a long walk with her husband.  She notes having minimal pain for which she had taken several doses of 800 mg of ibuprofen and a couple regular strength Tylenol.  She had not used any oxycodone postoperatively.  She endorses having a good appetite without nausea or emesis.  She was voiding freely without difficulty and reports normal flatus yesterday as well as several belches.  She denies a bowel movement postoperatively and had taken 2 doses of Senokot.  She states it is normal for her to go 2-4 days without a bowel movement.  She denies any significant abdominal pain.  After the walk, she notes starting to feel somewhat "out of it".  Her husband and daughter told her that she appeared as if she was falling asleep midsentence and was having trouble finding words.  When asked if she has had difficulty breathing or felt short of breath, she answers "not really".  Yesterday when she presented to the hospital, she was reporting progressively worsening shortness of breath and was found to have an  oxygen saturation of 87% on room air.  She denied having any chest pain or cough.  She denied fevers or chills.  Was feeling better earlier this morning but now starting to feel somewhat sleepy and having difficulty with word finding again.  Also reports some dizziness.  The patient was placed on BiPAP yesterday in the emergency department secondary to somnolence in the setting of hypercarbia.  She was weaned down to nasal cannula this morning.  PAST MEDICAL HISTORY: Past Medical History:  Diagnosis Date  . Anxiety   . Arthritis   . BRCA2 gene mutation positive in female   . De Quervain's syndrome (tenosynovitis)    Left wrist  . Depression   . Family history of BRCA2 gene positive   . Fibromyalgia   . Follicular lymphoma grade 3a [facility]) oncologist-- dr Jana Hakim   dx 03-02-2018  s/p left axilly node dissection (N[facility]),  no further treatment other than active survillance  . History of gout    02-21-2019  per pt has years since last episode  . History of radiation therapy    RIGHT BREAST COMPLETED 12/ 2012  . 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 History of TMJ syndrome   . Hyperlipidemia   . Hypothyroidism   . 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 Osteopenia 08/09/2011  . Peripheral neuropathy    secondary  to chemo  . Personal history of chemotherapy    COMPLETED 12/ 2012  . Personal history of radiation therapy   . Wears glasses     PAST SURGICAL HISTORY: Past Surgical History:  Procedure Laterality Date  . BREAST EXCISIONAL BIOPSY Left 2019  . BREAST LUMPECTOMY Right 2012  . BREAST LUMPECTOMY WITH AXILLARY LYMPH NODE BIOPSY Right 07/08/2010   W/  PAC INSERTION  . MANDIBLE SURGERY  1991   due to accident as a child. sx to straighten dental bite.   Marland Kitchen PORT-A-CATH REMOVAL  12/28/2010  . RADIOACTIVE SEED GUIDED AXILLARY SENTINEL LYMPH NODE Left 03/02/2018   Procedure: SEED TARGETED DEEP LEFT AXILLARY LYMPH NODE BIOPSY;  Surgeon: [name], MD;  Location: [facility];  Service: General;  Laterality: Left;  . ROBOTIC ASSISTED SALPINGO OOPHERECTOMY Bilateral 02/25/2019   Procedure: XI ROBOTIC ASSISTED SALPINGO OOPHORECTOMY;  Surgeon: Everitt Amber, MD;  Location: [facility];  Service: Gynecology;  Laterality: Bilateral;    MEDICATIONS:  Current Facility-Administered Medications:  .  0.9 %  sodium chloride infusion, , Intravenous, Continuous, Rise Patience, MD, Last Rate: 75 mL/hr at 02/27/19 0957, Rate Verify at 02/27/19 0957 .  acetaminophen (TYLENOL) tablet 650 mg, 650 mg, Oral, Q6H PRN **OR** acetaminophen (TYLENOL) suppository 650 mg, 650 mg, Rectal, Q6H PRN, Rise Patience, MD .  Chlorhexidine Gluconate Cloth 2 % PADS 6 each, 6 each, Topical, Daily, Rise Patience, MD, 6 each at 02/27/19 0907 .  heparin injection 5,000 Units, 5,000 Units, Subcutaneous, Q8H, Rise Patience, MD, 5,000 Units at 02/27/19 [phone] .  [START ON 02/28/2019] levothyroxine (SYNTHROID) tablet 88 mcg, 88 mcg, Oral, QAC breakfast, Posey Pronto, Michaele Offer, MD .  ondansetron [facility]) tablet 4 mg, 4 mg, Oral, Q6H PRN **OR** ondansetron (ZOFRAN) injection 4 mg, 4 mg, Intravenous, Q6H PRN, Rise Patience, MD .  sodium chloride (PF) 0.9 % injection, , , ,   ALLERGIES: No Known Allergies  FAMILY HISTORY: Family History  Problem Relation Age of Onset  . Cancer Maternal Aunt 75       colon - great maternal aunt  . Cancer Paternal Aunt 66       gyn- unsure if ovarian or uterine- met to lung  . Breast cancer Maternal Aunt 23  . Breast cancer Mother 67  . Prostate cancer Paternal Uncle        dx >50  . Heart disease Maternal Grandmother 80  . Pneumonia Maternal Grandfather 54  . Lung cancer Paternal Grandmother 35  . Leukemia Paternal  Grandfather 84  . Pancreatic cancer Paternal Aunt 58  . Bladder Cancer Paternal Aunt 50  . Lung cancer Paternal Aunt   . Other Cousin        BRCA2 +  . Breast cancer Cousin        dx <50  . Colon cancer Neg Hx     SOCIAL HISTORY: Social History   Socioeconomic History  . Marital status: Married    Spouse name: Not on file  . Number of children: Not on file  . Years of education: Not on file  . Highest education level: Not on file  Occupational History  . Not on file  Tobacco Use  . Smoking status: Never Smoker  . Smokeless tobacco: Never Used  Substance and Sexual Activity  . Alcohol use: Not Currently  . Drug use: Never  . Sexual activity: Not on file  Other Topics Concern  . Not on file  Social History Narrative  .  Not on file   Social Determinants of Health   Financial Resource Strain:   . Difficulty of Paying Living Expenses: Not on file  Food Insecurity:   . Worried About Charity fundraiser in the Last Year: Not on file  . Ran Out of Food in the Last Year: Not on file  Transportation Needs:   . Lack of Transportation (Medical): Not on file  . Lack of Transportation (Non-Medical): Not on file  Physical Activity:   . Days of Exercise per Week: Not on file  . Minutes of Exercise per Session: Not on file  Stress:   . Feeling of Stress : Not on file  Social Connections:   . Frequency of Communication with Friends and Family: Not on file  . Frequency of Social Gatherings with Friends and Family: Not on file  . Attends Religious Services: Not on file  . Active Member of Clubs or Organizations: Not on file  . Attends Archivist Meetings: Not on file  . Marital Status: Not on file  Intimate Partner Violence:   . Fear of Current or Ex-Partner: Not on file  . Emotionally Abused: Not on file  . Physically Abused: Not on file  . Sexually Abused: Not on file    REVIEW OF SYSTEMS: Pertinent positives as per HPI. Denies appetite changes, fevers,  chills, unexplained weight changes. Denies hearing loss, neck lumps or masses, mouth sores, ringing in ears or voice changes. Denies cough or wheezing.   Denies chest pain or palpitations. Denies leg swelling. Denies abdominal distention, pain, blood in stools, diarrhea, nausea, vomiting, or early satiety. Denies pain with intercourse, dysuria, frequency, hematuria or incontinence. Denies hot flashes, pelvic pain, vaginal bleeding or vaginal discharge.   Denies joint pain, back pain or muscle pain/cramps. Denies itching, rash, or wounds. Denies numbness or seizures. Denies swollen lymph nodes or glands, denies easy bruising or bleeding. Denies anxiety, depression,or decreased concentration.   PHYSICAL EXAM: BP 117/71   Pulse 85   Temp 98 F (36.7 C) (Oral)   Resp 17   Ht '5\' 4"'$  (1.626 m)   Wt 200 lb (90.7 kg)   SpO2 98%   BMI 34.33 kg/m  General: Alert, oriented to time, place, person, and situation; no acute distress.  Several times during the exam, the patient closed her eyes and appeared momentarily to be somnolent, then would open her eyes and continue her sentence or ask me to repeat my question.  Several times also had difficulty with word finding. HEENT: Normocephalic, atraumatic.  Sclera anicteric. Chest: Clear to auscultation bilaterally. Cardiovascular: Regular rate and rhythm, no murmurs, rubs, or gallops. Abdomen: Obese.  Normoactive bowel sounds.  Soft, nondistended, nontender to palpation.  No masses or hepatosplenomegaly appreciated.  Well-healing incisions with Dermabond in place, some significant bruising around left mid abdominal incision.  No induration, exudate or erythema.   Extremities: Grossly normal range of motion.  Warm, well perfused.  Trace edema bilaterally. Skin: No rashes or lesions. GU: Deferred.  LABORATORY AND RADIOLOGIC DATA: CBC    Component Value Date/Time   WBC 11.5 (H) 02/26/2019 2336   RBC 4.20 02/26/2019 2336   HGB 12.7 02/26/2019 2336    HGB 13.6 03/15/2018 1332   HGB 13.5 08/05/2014 0817   HCT 39.1 02/26/2019 2336   HCT 40.1 08/05/2014 0817   PLT 172 02/26/2019 2336   PLT 189 03/15/2018 1332   PLT 165 08/05/2014 0817   MCV 93.1 02/26/2019 2336   MCV 89.3 08/05/2014  0817   MCH 30.2 02/26/2019 2336   MCHC 32.5 02/26/2019 2336   RDW 13.2 02/26/2019 2336   RDW 13.5 08/05/2014 0817   LYMPHSABS 2.4 02/26/2019 2336   LYMPHSABS 1.5 08/05/2014 0817   MONOABS 1.2 (H) 02/26/2019 2336   MONOABS 0.6 08/05/2014 0817   EOSABS 0.3 02/26/2019 2336   EOSABS 0.3 08/05/2014 0817   BASOSABS 0.1 02/26/2019 2336   BASOSABS 0.0 08/05/2014 0817   CMP Latest Ref Rng & Units 02/26/2019 01/04/2019 08/03/2018  Glucose 70 - 99 mg/dL 105(H) 103(H) 99  BUN 6 - 20 mg/dL 23(H) 12 13  Creatinine 0.44 - 1.00 mg/dL 1.36(H) 0.83 0.88  Sodium 135 - 145 mmol/L 133(L) 139 139  Potassium 3.5 - 5.1 mmol/L 4.0 3.8 4.4  Chloride 98 - 111 mmol/L 96(L) 107 103  CO2 22 - 32 mmol/L 26 21(L) 26  Calcium 8.9 - 10.3 mg/dL 9.2 9.2 9.4  Total Protein 6.5 - 8.1 g/dL - 7.2 7.4  Total Bilirubin 0.3 - 1.2 mg/dL - 0.7 0.6  Alkaline Phos 38 - 126 U/L - 74 70  AST 15 - 41 U/L - 16 19  ALT 0 - 44 U/L - 15 18   Arterial blood gas:  Ref Range & Units 1 d ago  pH, Arterial 7.350 - 7.450 7.285Low    pCO2 arterial 32.0 - 48.0 mmHg 58.5High    pO2, Arterial 83.0 - 108.0 mmHg 108   Bicarbonate 20.0 - 28.0 mmol/L 27.1   Acid-base deficit 0.0 - 2.0 mmol/L 0.2   O2 Saturation % 98.7   Patient temperature  97.7   Allens test (pass/fail) PASS PASS    SARS COVID test: pending MRSA: pending  TSH: 0.929 Ammonia: 31 D-dime: 0.61  Urine rapid drug screen: negative  UA: clear straw colored urine, negative ketones, protein, nitrites and LKE  CT angio chest: IMPRESSION: 1. No pulmonary embolus. 2. Fluid-filled mid and distal esophagus with dilated fluid-filled stomach, partially included, unknown etiology. No evidence of esophageal or gastric wall  thickening.  CXR: IMPRESSION: Low lung volumes with streaky bibasilar atelectatic changes. No other acute cardiopulmonary abnormality.   ASSESSMENT AND PLAN: Leilyn CHAELA BRANSCUM is a 58 y.o. woman presenting less than 2 days postop from outpatient robotic BSO with neurologic symptoms, acute kidney injury, acidosis and hypercarbia.  Given proximity to surgery and multiple nonspecific symptoms involving multiple organs, recommend CT of the abdomen and pelvis with contrast to rule out surgical injury.  Patient appears nontoxic and has been tolerating a normal diet without any significant abdominal symptoms.  Additionally, the CT of her chest showed small volume free air, consistent with recent laparoscopic procedure.  My suspicion based on her exam is low for bowel injury although this would be the most likely cause of her presentation if related to a postoperative complication.  Would continue to keep the patient n.p.o. until CT has been performed.  In the setting of her neurologic symptoms and plan for abdominal CT, recommend concurrent CT of the head to rule out intracranial process.  We will call the patient's husband to update him on her status as per her request.  The above assessment and plan was communicated to the patient's primary team.  Jeral Pinch MD Gynecologic Oncology [phone]

## 2019-02-27 NOTE — H&P (Signed)
History and Physical    [name] WNI:[id number] DOB: Aug 25, 1960 DOA: 02/26/2019  PCP: [name], MD  Patient coming from: Home.  Chief Complaint: Drowsiness.  HPI: [name] is a 58 y.o. female with history of breast cancer and non-Hodgkin's lymphoma had bilateral oophorectomy and hysterectomy on 14th December and was discharged home yesterday following which patient was found to be drowsy.  Patient denies taking any narcotics for pain but did take some ibuprofen.  Denies any fall nausea vomiting diarrhea fever chills headache chest pain shortness of breath.  ED Course: In the ER patient was afebrile and was somnolent as per the ER physician who saw the patient initially.  CT angiogram of the chest show some fluid-filled esophagus and stomach.  Otherwise nothing acute.  Patient's ABG showed hypercarbia with pH of 7.285 PCO2 of 58.5.  Patient was placed on BiPAP following which patient mentation improved.  Patient admitted for observation.  Patient's creatinine is increased from baseline of 1.8-1.3 WBC count is 11.5.  COVID-19 test is pending.  EKG normal sinus rhythm.  Patient's mentation did not improve with Narcan.  Only improved after BiPAP.  Pupils were not pinpoint.  Review of Systems: As per HPI, rest all negative.   Past Medical History:  Diagnosis Date  . Anxiety   . Arthritis   . BRCA2 gene mutation positive in female   . De Quervain's syndrome (tenosynovitis)    Left wrist  . Depression   . Family history of BRCA2 gene positive   . Fibromyalgia   . Follicular [facility]-- dr Jana Hakim   dx 03-02-2018  s/p left axilly node dissection (NHL),  no further treatment other than active survillance  . History of gout    02-21-2019  per pt has years since last episode  . History of radiation therapy    RIGHT BREAST COMPLETED 12/ 2012  . History of right breast cancer oncologist--  dr Jana Hakim--  no recurrence   dx 04/ 2012--  [facility],  Stage IIA, Grade 3, 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);  07-08-2010 s/p breast lumpecotmy w/ node dissection;  completed chemoradiation 12/ 2012  . History of TMJ syndrome   . Hyperlipidemia   . Hypothyroidism   . 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 Osteopenia 08/09/2011  . 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 Personal history of chemotherapy    COMPLETED 12/ 2012  . Personal history of radiation therapy   . Wears glasses     Past Surgical History:  Procedure Laterality Date  . BREAST EXCISIONAL BIOPSY Left 2019  . BREAST LUMPECTOMY Right 2012  . BREAST LUMPECTOMY WITH AXILLARY LYMPH NODE BIOPSY Right 07/08/2010   W/  PAC INSERTION  . MANDIBLE SURGERY  1991   due to accident as a child. sx to straighten dental bite.  Marland Kitchen PORT-A-CATH REMOVAL  12/28/2010  . RADIOACTIVE SEED GUIDED AXILLARY SENTINEL LYMPH NODE Left 03/02/2018   Procedure: SEED TARGETED DEEP LEFT AXILLARY LYMPH NODE BIOPSY;  Surgeon: [name], MD;  Location: [facility];  Service: General;  Laterality: Left;  . ROBOTIC ASSISTED SALPINGO OOPHERECTOMY Bilateral 02/25/2019   Procedure: XI ROBOTIC ASSISTED SALPINGO OOPHORECTOMY;  Surgeon: Everitt Amber, MD;  Location: [facility];  Service: Gynecology;  Laterality: Bilateral;     reports that she has never smoked. She has never used smokeless tobacco. She reports previous alcohol use. She reports that she does not  use drugs.  No Known Allergies  Family History  Problem Relation Age of Onset  . Cancer Maternal Aunt 75       colon - great maternal aunt  . Cancer Paternal Aunt 47       gyn- unsure if ovarian or uterine- met to lung  . Breast cancer Maternal Aunt 18  . Breast cancer Mother 38  . Prostate cancer Paternal Uncle        dx >50  . Heart disease Maternal Grandmother 80  . Pneumonia Maternal Grandfather 39  . Lung cancer Paternal Grandmother 66  . Leukemia Paternal Grandfather 70  . Pancreatic cancer  Paternal Aunt 89  . Bladder Cancer Paternal Aunt 20  . Lung cancer Paternal Aunt   . Other Cousin        BRCA2 +  . Breast cancer Cousin        dx <50  . Colon cancer Neg Hx     Prior to Admission medications   Medication Sig Start Date End Date Taking? Authorizing Provider  alendronate (FOSAMAX) 35 MG tablet Take 1 tablet (35 mg total) by mouth every Wednesday. Take with a full glass of water on an empty stomach. 08/15/18  Yes Magrinat, Virgie Dad, MD  anastrozole (ARIMIDEX) 1 MG tablet Take 1 tablet (1 mg total) by mouth daily. 08/09/18  Yes Magrinat, Virgie Dad, MD  chlorpheniramine (CHLOR-TRIMETON) 4 MG tablet Take 4 mg by mouth daily as needed for allergies.  04/10/17  Yes Magrinat, Virgie Dad, MD  citalopram (CELEXA) 40 MG tablet Take 1 tablet (40 mg total) by mouth daily. 08/09/18  Yes Magrinat, Virgie Dad, MD  fluticasone (FLONASE) 50 MCG/ACT nasal spray Place 1 spray into both nostrils at bedtime.    Yes [provider]  gabapentin (NEURONTIN) 300 MG capsule Take 1 capsule (300 mg total) by mouth 3 (three) times daily. 08/09/18  Yes Magrinat, Virgie Dad, MD  ibuprofen (ADVIL) 800 MG tablet Take 1 tablet (800 mg total) by mouth every 8 (eight) hours as needed for moderate pain. For AFTER surgery only 12/31/18  Yes Cross, Melissa D, NP  levothyroxine (SYNTHROID, LEVOTHROID) 88 MCG tablet Take 88 mcg by mouth daily before breakfast.    Yes [provider]  oxyCODONE (OXY IR/ROXICODONE) 5 MG immediate release tablet Take 1 tablet (5 mg total) by mouth every 4 (four) hours as needed for severe pain. For AFTER surgery only, do not take and drive 85/02/77  Yes Cross, Melissa D, NP  senna-docusate (SENOKOT-S) 8.6-50 MG tablet Take 2 tablets by mouth at bedtime. For AFTER surgery, do not take if having diarrhea 12/31/18  Yes Cross, Melissa D, NP  meloxicam (MOBIC) 7.5 MG tablet Take 2 tablets (15 mg total) by mouth daily. 08/09/18   Magrinat, Virgie Dad, MD  pravastatin (PRAVACHOL) 40 MG tablet  Take 1 tablet (40 mg total) by mouth at bedtime. 08/09/18   Magrinat, Virgie Dad, MD    Physical Exam: Constitutional: Moderately built and nourished. Vitals:   02/26/19 2247 02/27/19 0222 02/27/19 0301 02/27/19 0427  BP:  113/81 140/83 110/69  Pulse:  98 81 71  Resp:  '13 19 15  '$ Temp:      TempSrc:      SpO2: 97% 98% 93% 98%  Weight:      Height:       Eyes: Anicteric no pallor. ENMT: No discharge from the ears eyes nose or mouth. Neck: No mass or.  No neck rigidity. Respiratory: No rhonchi or  crepitations. Cardiovascular: S1-S2 heard. Abdomen: Soft nontender bowel sounds present. Musculoskeletal: No edema. Skin: No rash. Neurologic: Mildly drowsy but at the time of my exam is more alert awake following questions and answering appropriately oriented to her name and place moves all extremities.  Pupils are reacting to light. Psychiatric: Appears normal.   Labs on Admission: I have personally reviewed following labs and imaging studies  CBC: Recent Labs  Lab 02/21/19 1200 02/26/19 2336  WBC 6.3 11.5*  NEUTROABS  --  7.4  HGB 14.2 12.7  HCT 43.6 39.1  MCV 90.6 93.1  PLT 164 790   Basic Metabolic Panel: Recent Labs  Lab 02/26/19 2336  NA 133*  K 4.0  CL 96*  CO2 26  GLUCOSE 105*  BUN 23*  CREATININE 1.36*  CALCIUM 9.2   GFR: Estimated Creatinine Clearance: 48.6 mL/min (A) (by C-G formula based on SCr of 1.36 mg/dL (H)). Liver Function Tests: No results for input(s): AST, ALT, ALKPHOS, BILITOT, PROT, ALBUMIN in the last 168 hours. No results for input(s): LIPASE, AMYLASE in the last 168 hours. No results for input(s): AMMONIA in the last 168 hours. Coagulation Profile: No results for input(s): INR, PROTIME in the last 168 hours. Cardiac Enzymes: No results for input(s): CKTOTAL, CKMB, CKMBINDEX, TROPONINI in the last 168 hours. BNP (last 3 results) No results for input(s): PROBNP in the last 8760 hours. HbA1C: No results for input(s): HGBA1C in the last 72  hours. CBG: No results for input(s): GLUCAP in the last 168 hours. Lipid Profile: No results for input(s): CHOL, HDL, LDLCALC, TRIG, CHOLHDL, LDLDIRECT in the last 72 hours. Thyroid Function Tests: No results for input(s): TSH, T4TOTAL, FREET4, T3FREE, THYROIDAB in the last 72 hours. Anemia Panel: No results for input(s): VITAMINB12, FOLATE, FERRITIN, TIBC, IRON, RETICCTPCT in the last 72 hours. Urine analysis:    Component Value Date/Time   COLORURINE YELLOW 02/21/2019 1200   APPEARANCEUR CLEAR 02/21/2019 1200   LABSPEC 1.014 02/21/2019 1200   PHURINE 5.0 02/21/2019 1200   GLUCOSEU NEGATIVE 02/21/2019 1200   HGBUR SMALL (A) 02/21/2019 1200   BILIRUBINUR NEGATIVE 02/21/2019 1200   KETONESUR NEGATIVE 02/21/2019 1200   PROTEINUR NEGATIVE 02/21/2019 1200   NITRITE NEGATIVE 02/21/2019 1200   LEUKOCYTESUR NEGATIVE 02/21/2019 1200   Sepsis Labs: '@LABRCNTIP'$ (procalcitonin:4,lacticidven:4) ) Recent Results (from the past 240 hour(s))  Novel Coronavirus, NAA (Hosp order, Send-out to [facility]; TAT 18-24 hrs     Status: None   Collection Time: 02/21/19  4:17 PM   Specimen: Nasopharyngeal Swab; Respiratory  Result Value Ref Range Status   SARS-CoV-2, NAA NOT DETECTED NOT DETECTED Final    Comment: (NOTE) This nucleic acid amplification test was developed and its performance characteristics determined by [employer]. Nucleic acid amplification tests include PCR and TMA. This test has not been FDA cleared or approved. This test has been authorized by FDA under an Emergency Use Authorization (EUA). This test is only authorized for the duration of time the declaration that circumstances exist justifying the authorization of the emergency use of in vitro diagnostic tests for detection of SARS-CoV-2 virus and/or diagnosis of COVID-19 infection under section 564(b)(1) of the Act, 21 U.S.C. 240XBD-5(H) (1), unless the authorization is terminated or revoked sooner. When diagnostic  testing is negative, the possibility of a false negative result should be considered in the context of a patient's recent exposures and the presence of clinical signs and symptoms consistent with COVID-19. An individual without symptoms of COVID- 19 and who is not shedding  SARS-CoV-2 vi rus would expect to have a negative (not detected) result in this assay. Performed At: [facility] [address] [place], [place] [id number] Rush Farmer MD TK:[id number]    [place]  Final    Comment: Performed at [facility], [place] [address] [place], [place] [address]     Radiological Exams on Admission: DG Chest 2 View  Result Date: 02/26/2019 CLINICAL DATA:  Shortness of breath, post salpingo-oophorectomy performed yesterday, increasing shortness of breath EXAM: CHEST - 2 VIEW COMPARISON:  Most recent comparison November 11, 2010 FINDINGS: Some low lung volumes with streaky basilar atelectatic changes. No convincing consolidative opacity, pneumothorax or effusion. No suggestive features of edema. Cardiomediastinal contours are stable from prior. Degenerative changes are present in the imaged spine and shoulders. Mild dextrocurvature of the midthoracic spine, similar to priors. Bilateral axillary surgical clips likely reflect prior nodal dissection. No acute osseous or soft tissue abnormality. IMPRESSION: Low lung volumes with streaky bibasilar atelectatic changes. No other acute cardiopulmonary abnormality. Electronically Signed   By: Lovena Le M.D.   On: 02/26/2019 23:22   CT Angio Chest PE W and/or Wo Contrast  Result Date: 02/27/2019 CLINICAL DATA:  Shortness of breath. Salpingo-oophorectomy 2 days ago. EXAM: CT ANGIOGRAPHY CHEST WITH CONTRAST TECHNIQUE: Multidetector CT imaging of the chest was performed using the standard protocol during bolus administration of intravenous contrast. Multiplanar CT image reconstructions and MIPs were obtained to evaluate the  vascular anatomy. CONTRAST:  79m OMNIPAQUE IOHEXOL 350 MG/ML SOLN COMPARISON:  Chest radiograph yesterday. Chest CT 02/21/2018 FINDINGS: Cardiovascular: There are no filling defects within the pulmonary arteries to suggest pulmonary embolus. The thoracic aorta is normal in caliber. Mild aortic atherosclerosis. No dissection. However a right subclavian artery courses posterior to the esophagus. Upper normal heart size. No pericardial effusion. Mediastinum/Nodes: Fluid-filled esophagus. No enlarged mediastinal or hilar lymph nodes. No visualized thyroid nodule. Lungs/Pleura: Imaging obtained in expiration with heterogeneous pulmonary parenchyma. No focal airspace disease. No pleural effusion. No evidence of pulmonary edema. No pulmonary mass. No pulmonary nodule. Upper Abdomen: Fluid-filled distended stomach is partially included. Small foci of free air consistent with recent abdominal surgery. Interposition of colon posterior to the liver. Musculoskeletal: Surgical clips in the right breast and axilla. There are no acute or suspicious osseous abnormalities. Review of the MIP images confirms the above findings. IMPRESSION: 1. No pulmonary embolus. 2. Fluid-filled mid and distal esophagus with dilated fluid-filled stomach, partially included, unknown etiology. No evidence of esophageal or gastric wall thickening. Electronically Signed   By: MKeith RakeM.D.   On: 02/27/2019 02:14    EKG: Independently reviewed.  Normal sinus rhythm.  Assessment/Plan Active Problems:   Thyroid disease   Lymphoproliferative disorder, low grade B cell (HCC)   Acute respiratory failure with hypoxia and hypercarbia (HCC)   Acute encephalopathy   [place] kidney injury) [place]    1. Acute encephalopathy secondary to hypercarbia because of which is not clear.  Patient denies taking any narcotics.  Patient does have acute renal failure suspect may be but not sure if any retained anesthesia affect.  Follow metabolic panel  continue hydration avoid narcotics repeat ABG in the morning if patient is still drowsy otherwise may DC BiPAP.  Ammonia and urine drug screen are pending. 2. Acute renal failure cause not clear continue to hydrate follow metabolic panel. 3. Hypothyroidism on Synthroid.  Will dose IV until patient can take orally. 4. History of hyperlipidemia. 5. History of breast cancer and non-Hodgkin's  lymphoma being followed by Dr. Jana Hakim oncologist. 6. Recent hysterectomy with bilateral nephrectomy as a prophylaxis for BRCA gene mutation. 7. Patient's CAT scan does show fluid-filled esophagus and stomach.  Patient appears asymptomatic.  Advised that if patient continues to be asymptomatic to follow as outpatient.  COVID-19 test is pending but patient has no symptoms of Covid.  UA and urine drug screen is pending.   DVT prophylaxis: Heparin. Code Status: Full code. Family Communication: Discussed with patient. Disposition Plan: Home. Consults called: None. Admission status: Observation.   Rise Patience MD [facility] Pager [phone].  If 7PM-7AM, please contact night-coverage [email or web address] Password [facility]  02/27/2019, 5:53 AM

## 2019-02-27 NOTE — ED Notes (Signed)
Resp called for bipap

## 2019-02-27 NOTE — Telephone Encounter (Signed)
Called and spoke with the patient's husband.  Discussed that we have ordered a CT scan of the abdomen and pelvis to rule out a surgical cause for her symptoms including injury. We have also ordered a CT of the head given her persistent somnolence and aphasia. He is appreciative for the update.  Advised him that we would keep him posted on any information we knew.

## 2019-02-27 NOTE — ED Notes (Signed)
Pt ambulated to the bathroom without assistance. Gait steady  

## 2019-02-28 DIAGNOSIS — J9601 Acute respiratory failure with hypoxia: Secondary | ICD-10-CM | POA: Diagnosis not present

## 2019-02-28 DIAGNOSIS — Z9221 Personal history of antineoplastic chemotherapy: Secondary | ICD-10-CM | POA: Diagnosis not present

## 2019-02-28 DIAGNOSIS — Z79811 Long term (current) use of aromatase inhibitors: Secondary | ICD-10-CM | POA: Diagnosis not present

## 2019-02-28 DIAGNOSIS — R4701 Aphasia: Secondary | ICD-10-CM | POA: Diagnosis present

## 2019-02-28 DIAGNOSIS — E869 Volume depletion, unspecified: Secondary | ICD-10-CM | POA: Diagnosis present

## 2019-02-28 DIAGNOSIS — Z79899 Other long term (current) drug therapy: Secondary | ICD-10-CM | POA: Diagnosis not present

## 2019-02-28 DIAGNOSIS — E785 Hyperlipidemia, unspecified: Secondary | ICD-10-CM | POA: Diagnosis present

## 2019-02-28 DIAGNOSIS — Z20828 Contact with and (suspected) exposure to other viral communicable diseases: Secondary | ICD-10-CM | POA: Diagnosis present

## 2019-02-28 DIAGNOSIS — E039 Hypothyroidism, unspecified: Secondary | ICD-10-CM | POA: Diagnosis present

## 2019-02-28 DIAGNOSIS — J9602 Acute respiratory failure with hypercapnia: Secondary | ICD-10-CM | POA: Diagnosis present

## 2019-02-28 DIAGNOSIS — C50911 Malignant neoplasm of unspecified site of right female breast: Secondary | ICD-10-CM | POA: Diagnosis present

## 2019-02-28 DIAGNOSIS — G9341 Metabolic encephalopathy: Secondary | ICD-10-CM | POA: Diagnosis present

## 2019-02-28 DIAGNOSIS — F329 Major depressive disorder, single episode, unspecified: Secondary | ICD-10-CM | POA: Diagnosis present

## 2019-02-28 DIAGNOSIS — Z923 Personal history of irradiation: Secondary | ICD-10-CM | POA: Diagnosis not present

## 2019-02-28 DIAGNOSIS — Z8572 Personal history of non-Hodgkin lymphomas: Secondary | ICD-10-CM | POA: Diagnosis not present

## 2019-02-28 DIAGNOSIS — Z803 Family history of malignant neoplasm of breast: Secondary | ICD-10-CM | POA: Diagnosis not present

## 2019-02-28 DIAGNOSIS — G620 Drug-induced polyneuropathy: Secondary | ICD-10-CM | POA: Diagnosis present

## 2019-02-28 DIAGNOSIS — E872 Acidosis: Secondary | ICD-10-CM | POA: Diagnosis present

## 2019-02-28 DIAGNOSIS — Z90710 Acquired absence of both cervix and uterus: Secondary | ICD-10-CM | POA: Diagnosis not present

## 2019-02-28 DIAGNOSIS — M8580 Other specified disorders of bone density and structure, unspecified site: Secondary | ICD-10-CM | POA: Diagnosis present

## 2019-02-28 DIAGNOSIS — Z79890 Hormone replacement therapy (postmenopausal): Secondary | ICD-10-CM | POA: Diagnosis not present

## 2019-02-28 DIAGNOSIS — Z7983 Long term (current) use of bisphosphonates: Secondary | ICD-10-CM | POA: Diagnosis not present

## 2019-02-28 DIAGNOSIS — T451X5S Adverse effect of antineoplastic and immunosuppressive drugs, sequela: Secondary | ICD-10-CM | POA: Diagnosis not present

## 2019-02-28 DIAGNOSIS — G9340 Encephalopathy, unspecified: Secondary | ICD-10-CM | POA: Diagnosis not present

## 2019-02-28 DIAGNOSIS — N179 Acute kidney failure, unspecified: Secondary | ICD-10-CM | POA: Diagnosis present

## 2019-02-28 LAB — GLUCOSE, CAPILLARY
Glucose-Capillary: 79 mg/dL (ref 70–99)
Glucose-Capillary: 89 mg/dL (ref 70–99)
Glucose-Capillary: 92 mg/dL (ref 70–99)
Glucose-Capillary: 98 mg/dL (ref 70–99)

## 2019-02-28 LAB — BLOOD GAS, ARTERIAL
Acid-Base Excess: 5.4 mmol/L — ABNORMAL HIGH (ref 0.0–2.0)
Bicarbonate: 30.3 mmol/L — ABNORMAL HIGH (ref 20.0–28.0)
Drawn by: 33147
O2 Saturation: 95.6 %
Patient temperature: 98.7
pCO2 arterial: 47.3 mmHg (ref 32.0–48.0)
pH, Arterial: 7.422 (ref 7.350–7.450)
pO2, Arterial: 71.4 mmHg — ABNORMAL LOW (ref 83.0–108.0)

## 2019-02-28 LAB — CREATININE, SERUM
Creatinine, Ser: 0.65 mg/dL (ref 0.44–1.00)
GFR calc Af Amer: >60 mL/min
GFR calc non Af Amer: >60 mL/min

## 2019-02-28 MED ORDER — ENOXAPARIN SODIUM 40 MG/0.4ML ~~LOC~~ SOLN
40.0000 mg | SUBCUTANEOUS | Status: DC
  Administered 2019-02-28: 40 mg via SUBCUTANEOUS
  Filled 2019-02-28: qty 0.4

## 2019-02-28 NOTE — Progress Notes (Signed)
GYNECOLOGY ONCOLOGY  Subjective: Patient reports "not feeling bad but not feeling good, not myself."  Still having difficulty finding words/thoughts at times.  Feels the somnolence has improved. Tolerating diet and passing flatus. Wanting to get out of bed to urinate.  Denies chest pain, dyspnea, nausea, abdominal pain. States she thinks she could go home since she is better. No other concerns voiced.   Objective: Vital signs in last 24 hours: Temp:  [97.6 F (36.4 C)-98.3 F (36.8 C)] 98.1 F (36.7 C) (12/17 0800) Pulse Rate:  [70-100] 81 (12/17 1000) Resp:  [14-27] 20 (12/17 1000) BP: (108-166)/(54-96) 163/88 (12/17 1000) SpO2:  [87 %-100 %] 99 % (12/17 1000) Last BM Date: 02/24/19  Intake/Output from previous day: 12/16 0701 - 12/17 0700 In: 1627.5 [P.O.:120; I.V.:1507.5] Out: 1750 [Urine:1750]  Physical Examination: General: alert, cooperative and no distress Resp: clear to auscultation bilaterally Cardio: regular rate and rhythm, S1, S2 normal, no murmur, click, rub or gallop GI: incision: lap sites to the abdomen with mild ecchymosis, no drainage or erythema noted and abdomen soft, active bowel sounds, non tender Extremities: extremities normal, atraumatic, no cyanosis or edema  Aphagic  Labs:   BUN/Cr/glu/ALT/AST/amyl/lip:  --/0.65/--/--/--/--/-- (12/17 0805)   CT AP 02/27/2019: No evidence of surgical complication, basilar atelectasis bilaterally. CT Head without contrast 02/27/2019: negative MRI brain 02/27/2019 to rule out brain stem stroke: No acute abnormality  Assessment: 58 y.o. currently admitted with neurologic symptoms and hypercarbia s/p uncomplicated robotic assisted bilateral salpingo-oophorectomy on 02/25/2019 for BRCA mutation.  Pain:  No pain reported.  Heme: Hgb 12.7 and Hct 39.1 on 02/26/2019.   ID: WBC 11.5 on 02/26/2019.  CV: BP and HR stable.   GI:  Tolerating po: Yes. No nausea reported.  GU: Adequate amount recorded.    FEN: No  critical values on 12/15  Prophylaxis: Heparin ordered SQ.  Plan: Plan for consultation with neurology given continued aphasia per Dr. Rossi Continue plan of care per Hospitalist   LOS: 0 days    Melissa D Cross 02/28/2019, 10:21 AM       

## 2019-02-28 NOTE — Progress Notes (Signed)
PROGRESS NOTE    [name]  HGD:[id number] DOB: Apr 15, 1960 DOA: 02/26/2019 PCP: [name], MD    Brief Narrative:  58 year old Caucasian female with history of breast cancer, non-Hodgkin's lymphoma, recent bilateral oophorectomy/hysterectomy on 02/25/2019 admitted for somnolence   Assessment & Plan:   Principal Problem:   Acute encephalopathy Active Problems:   Thyroid disease   Lymphoproliferative disorder, low grade B cell (HCC)   Acute respiratory failure with hypoxia and hypercarbia (HCC)   AKI (acute kidney injury) ([place])  Acute encephalopathy Much improved.  Awake and able to hold a conversation without getting sleepy.  She does have some residual delayed thought and word finding difficulty. Etiology unclear at this time.  Most likely in setting of carbon dioxide retention.  Though etiology for carbon dioxide narcosis is not clear. ?Gabapentin in setting of AKI, anesthesia effect.  Patient denies taking narcotics.  CT head negative for any acute intracranial abnormality.  CT abdomen pelvis ordered by signout to rule out postsurgical complications negative.  MRI brain negative for posterior circulation stroke. Gyn onc on board given recent history of surgery.  Appreciate recommendations. Continue to monitor mental status She will need a sleep study as an outpatient  Acute hypercapnic respiratory failure Hypoxia on presentation has resolved.  CTA chest negative for PE.  Bibasilar atelectasis present. Initial ABG pH 7.2, PCO2 58.   Acute hypercapnia has resolved.  She will likely retains some carbon dioxide at baseline due to body habitus.  AKI Likely prerenal.  Creatinine resolved today  Hypothyroidism Continue Synthroid.    History of breast cancer non-Hodgkin's lymphoma With recent history of hysterectomy and bilateral oophorectomy as prophylaxis for BRCA gene mutation Followed by Dr. Jana Hakim  DVT prophylaxis: Heparin SQ Code Status: Full code    Family Communication: Discussed plan with the patient and husband at bedside Disposition Plan: Tentative plan for discharge home tomorrow if mentation continues to improve   Consultants:   Gynecologic oncology  Procedures:  None  Antimicrobials:   None   Subjective: Patient seen and examined this morning.  No acute events overnight.  Patient is sitting in the chair.  Husband at bedside reports patient has been making funny comments but is alert and aware of these.  He feels she is at a scale of 7 from 0-10.  When she came when she was at a scale of 0.  He would feel better if she stayed another night until she was at baseline. Patient denies any complaints and is requesting for regular diet.  Liquid diet switch to regular diet.  Objective: Vitals:   02/28/19 0800 02/28/19 1000 02/28/19 1200 02/28/19 1243  BP:  (!) 163/88  (!) 150/87  Pulse:  81  86  Resp:  20  11  Temp: 98.1 F (36.7 C)  97.6 F (36.4 C)   TempSrc: Oral  Oral   SpO2:  99%  97%  Weight:      Height:        Intake/Output Summary (Last 24 hours) at 02/28/2019 1539 Last data filed at 02/28/2019 1400 Gross per 24 hour  Intake 1357.9 ml  Output 950 ml  Net 407.9 ml   Filed Weights   02/26/19 2237  Weight: 90.7 kg    Examination:  General exam: Appears calm and comfortable  Respiratory system: Clear to auscultation. Respiratory effort normal. Cardiovascular system: S1 & S2 heard, RRR. No JVD. No pedal edema. Gastrointestinal system: Abdomen is nondistended, soft and nontender. No organomegaly or masses felt. Normal  bowel sounds heard. Central nervous system: Awake, oriented.  No focal neurological deficits.  Strength intact in all extremities.  Pupils small but equal and reactive to light.  Able to perform finger-to-nose test bilaterally.  Cranial nerves II to XII grossly intact.  Face is symmetric.  Intermittent word finding difficulty, increased latent phase. Extremities: Symmetric 5 x 5  power. Skin: No rashes, lesions or ulcers Psychiatry: Judgement and insight appear normal. Mood & affect appropriate.   Data Reviewed: I have personally reviewed following labs and imaging studies  CBC: Recent Labs  Lab 02/26/19 2336  WBC 11.5*  NEUTROABS 7.4  HGB 12.7  HCT 39.1  MCV 93.1  PLT 332   Basic Metabolic Panel: Recent Labs  Lab 02/26/19 2336 02/28/19 0805  NA 133*  --   K 4.0  --   CL 96*  --   CO2 26  --   GLUCOSE 105*  --   BUN 23*  --   CREATININE 1.36* 0.65  CALCIUM 9.2  --    GFR: Estimated Creatinine Clearance: 82.6 mL/min (by C-G formula based on SCr of 0.65 mg/dL). Liver Function Tests: Recent Labs  Lab 02/27/19 1420  AST 20  ALT 16  ALKPHOS 62  BILITOT 0.7  PROT 6.2*  ALBUMIN 3.6   No results for input(s): LIPASE, AMYLASE in the last 168 hours. Recent Labs  Lab 02/27/19 0750  AMMONIA 31   Coagulation Profile: No results for input(s): INR, PROTIME in the last 168 hours. Cardiac Enzymes: No results for input(s): CKTOTAL, CKMB, CKMBINDEX, TROPONINI in the last 168 hours. BNP (last 3 results) No results for input(s): PROBNP in the last 8760 hours. HbA1C: No results for input(s): HGBA1C in the last 72 hours. CBG: Recent Labs  Lab 02/27/19 0614 02/27/19 1140 02/27/19 2312 02/28/19 0600 02/28/19 1141  GLUCAP 90 87 85 79 89   Lipid Profile: No results for input(s): CHOL, HDL, LDLCALC, TRIG, CHOLHDL, LDLDIRECT in the last 72 hours. Thyroid Function Tests: Recent Labs    02/27/19 0750  TSH 0.929   Anemia Panel: No results for input(s): VITAMINB12, FOLATE, FERRITIN, TIBC, IRON, RETICCTPCT in the last 72 hours. Sepsis Labs: No results for input(s): PROCALCITON, LATICACIDVEN in the last 168 hours.  Recent Results (from the past 240 hour(s))  Novel Coronavirus, NAA (Hosp order, Send-out to Ref Lab; TAT 18-24 hrs     Status: None   Collection Time: 02/21/19  4:17 PM   Specimen: Nasopharyngeal Swab; Respiratory  Result Value Ref  Range Status   SARS-CoV-2, NAA NOT DETECTED NOT DETECTED Final    Comment: (NOTE) This nucleic acid amplification test was developed and its performance characteristics determined by [employer]. Nucleic acid amplification tests include PCR and TMA. This test has not been FDA cleared or approved. This test has been authorized by FDA under an Emergency Use Authorization (EUA). This test is only authorized for the duration of time the declaration that circumstances exist justifying the authorization of the emergency use of in vitro diagnostic tests for detection of SARS-CoV-2 virus and/or diagnosis of COVID-19 infection under section 564(b)(1) of the Act, 21 U.S.C. 951OAC-1(Y) (1), unless the authorization is terminated or revoked sooner. When diagnostic testing is negative, the possibility of a false negative result should be considered in the context of a patient's recent exposures and the presence of clinical signs and symptoms consistent with COVID-19. An individual without symptoms of COVID- 19 and who is not shedding SARS-CoV-2 vi rus would expect to have a negative (  not detected) result in this assay. Performed At: [facility] [address] [place], [place] [id number] Rush Farmer MD ML:[id number]    [place]  Final    Comment: Performed at [facility], [place] [address], [place], [place] [address]  SARS CORONAVIRUS 2 (TAT 6-24 HRS) Nasopharyngeal Nasopharyngeal Swab     Status: None   Collection Time: 02/27/19 12:35 AM   Specimen: Nasopharyngeal Swab  Result Value Ref Range Status   SARS Coronavirus 2 NEGATIVE NEGATIVE Final    Comment: (NOTE) SARS-CoV-2 target nucleic acids are NOT DETECTED. The SARS-CoV-2 RNA is generally detectable in upper and lower respiratory specimens during the acute phase of infection. Negative results do not preclude SARS-CoV-2 infection, do not rule out co-infections with other pathogens, and  should not be used as the sole basis for treatment or other patient management decisions. Negative results must be combined with clinical observations, patient history, and epidemiological information. The expected result is Negative. Fact Sheet for Patients: SugarRoll.be Fact Sheet for Healthcare Providers: https://www.woods-mathews.com/ This test is not yet approved or cleared by the [place] FDA and  has been authorized for detection and/or diagnosis of SARS-CoV-2 by FDA under an Emergency Use Authorization (EUA). This EUA will remain  in effect (meaning this test can be used) for the duration of the COVID-19 declaration under Section 56 4(b)(1) of the Act, 21 U.S.C. section 360bbb-3(b)(1), unless the authorization is terminated or revoked sooner. Performed at [facility], [place] [address], [place], [place] [address]          Radiology Studies: DG Chest 2 View  Result Date: 02/26/2019 CLINICAL DATA:  Shortness of breath, post salpingo-oophorectomy performed yesterday, increasing shortness of breath EXAM: CHEST - 2 VIEW COMPARISON:  Most recent comparison November 11, 2010 FINDINGS: Some low lung volumes with streaky basilar atelectatic changes. No convincing consolidative opacity, pneumothorax or effusion. No suggestive features of edema. Cardiomediastinal contours are stable from prior. Degenerative changes are present in the imaged spine and shoulders. Mild dextrocurvature of the midthoracic spine, similar to priors. Bilateral axillary surgical clips likely reflect prior nodal dissection. No acute osseous or soft tissue abnormality. IMPRESSION: Low lung volumes with streaky bibasilar atelectatic changes. No other acute cardiopulmonary abnormality. Electronically Signed   By: Lovena Le M.D.   On: 02/26/2019 23:22   CT HEAD WO CONTRAST  Result Date: 02/27/2019 CLINICAL DATA:  Mental status change. Altered mental status. History  of breast cancer. EXAM: CT HEAD WITHOUT CONTRAST TECHNIQUE: Contiguous axial images were obtained from the base of the skull through the vertex without intravenous contrast. COMPARISON:  None. FINDINGS: Brain: No evidence of acute infarction, hemorrhage, hydrocephalus, extra-axial collection or mass lesion/mass effect. Vascular: Negative for hyperdense vessel Skull: Negative Sinuses/Orbits: Prior surgical plate and screw fixation of the maxilla bilaterally. Extensive mucoperiosteal thickening in the maxillary sinus bilaterally. Negative orbit Other: None IMPRESSION: Negative CT of the brain. Electronically Signed   By: Franchot Gallo M.D.   On: 02/27/2019 14:08   CT Angio Chest PE W and/or Wo Contrast  Result Date: 02/27/2019 CLINICAL DATA:  Shortness of breath. Salpingo-oophorectomy 2 days ago. EXAM: CT ANGIOGRAPHY CHEST WITH CONTRAST TECHNIQUE: Multidetector CT imaging of the chest was performed using the standard protocol during bolus administration of intravenous contrast. Multiplanar CT image reconstructions and MIPs were obtained to evaluate the vascular anatomy. CONTRAST:  30m OMNIPAQUE IOHEXOL 350 MG/ML SOLN COMPARISON:  Chest radiograph yesterday. Chest CT 02/21/2018 FINDINGS: Cardiovascular: There are no filling defects  within the pulmonary arteries to suggest pulmonary embolus. The thoracic aorta is normal in caliber. Mild aortic atherosclerosis. No dissection. However a right subclavian artery courses posterior to the esophagus. Upper normal heart size. No pericardial effusion. Mediastinum/Nodes: Fluid-filled esophagus. No enlarged mediastinal or hilar lymph nodes. No visualized thyroid nodule. Lungs/Pleura: Imaging obtained in expiration with heterogeneous pulmonary parenchyma. No focal airspace disease. No pleural effusion. No evidence of pulmonary edema. No pulmonary mass. No pulmonary nodule. Upper Abdomen: Fluid-filled distended stomach is partially included. Small foci of free air consistent  with recent abdominal surgery. Interposition of colon posterior to the liver. Musculoskeletal: Surgical clips in the right breast and axilla. There are no acute or suspicious osseous abnormalities. Review of the MIP images confirms the above findings. IMPRESSION: 1. No pulmonary embolus. 2. Fluid-filled mid and distal esophagus with dilated fluid-filled stomach, partially included, unknown etiology. No evidence of esophageal or gastric wall thickening. Electronically Signed   By: Keith Rake M.D.   On: 02/27/2019 02:14   MR BRAIN WO CONTRAST  Result Date: 02/27/2019 CLINICAL DATA:  Somnolent, hypercarbic.  Rule out stroke. EXAM: MRI HEAD WITHOUT CONTRAST TECHNIQUE: Multiplanar, multiecho pulse sequences of the brain and surrounding structures were obtained without intravenous contrast. COMPARISON:  CT head fall 16 2020 FINDINGS: Brain: Negative for acute infarct. Few small white matter hyperintensities bilaterally. Brainstem normal. Negative for hemorrhage or mass. Vascular: Normal arterial flow voids Skull and upper cervical spine: No focal skeletal abnormality. Sinuses/Orbits: Mucosal edema paranasal sinuses.  Normal orbit Other: None IMPRESSION: No acute abnormality. Mild white matter changes most likely due to chronic microvascular ischemia. Electronically Signed   By: Franchot Gallo M.D.   On: 02/27/2019 21:45   CT ABDOMEN PELVIS W CONTRAST  Result Date: 02/27/2019 CLINICAL DATA:  Postoperative day 1 from robotic BSO. Mental status changes and new elevated creatinine. EXAM: CT ABDOMEN AND PELVIS WITH CONTRAST TECHNIQUE: Multidetector CT imaging of the abdomen and pelvis was performed using the standard protocol following bolus administration of intravenous contrast. CONTRAST:  16m OMNIPAQUE IOHEXOL 300 MG/ML  SOLN COMPARISON:  02/21/2018 FINDINGS: Lower chest: Dependent basilar atelectasis without airspace consolidation or pleural effusion. Hepatobiliary: No suspicious focal abnormality within  the liver parenchyma. There is no evidence for gallstones, gallbladder wall thickening, or pericholecystic fluid. No intrahepatic or extrahepatic biliary dilation. Pancreas: No focal mass lesion. No dilatation of the main duct. No intraparenchymal cyst. No peripancreatic edema. Spleen: No splenomegaly. No focal mass lesion. Adrenals/Urinary Tract: No adrenal nodule or mass. Kidneys unremarkable. Tiny hypodensities in the lower pole right kidney are too small to characterize but likely benign. No hydroureteronephrosis. No periureteric edema or fluid. The urinary bladder appears normal for the degree of distention. Stomach/Bowel: Stomach is unremarkable. No gastric wall thickening. No evidence of outlet obstruction. Duodenum is normally positioned as is the ligament of Treitz. No small bowel wall thickening. No small bowel dilatation. Cecum is high in the right abdomen. The terminal ileum is normal. The appendix is normal. No gross colonic mass. No colonic wall thickening. Mild diverticular disease in the colon is right-side predominant. Vascular/Lymphatic: There is abdominal aortic atherosclerosis without aneurysm. There is no gastrohepatic or hepatoduodenal ligament lymphadenopathy. No retroperitoneal or mesenteric lymphadenopathy. Upper normal lymph nodes are seen in the hepato duodenal ligament. No pelvic sidewall lymphadenopathy. Small right external iliac nodes are stable. Upper normal right common femoral lymph node is stable since prior study and most likely reactive. Reproductive: Uterus unremarkable.  There is no adnexal mass. Other: No intraperitoneal free  fluid. Trace intraperitoneal free air noted anterior to the liver, not unexpected on postoperative day 1. Musculoskeletal: There is some gas in the subcutaneous fat of the left anterior abdominal wall, compatible surgery yesterday. No worrisome lytic or sclerotic osseous abnormality. IMPRESSION: 1. No evidence for surgical complication. There is no  intraperitoneal free fluid or evidence of pelvic hematoma. No hydroureteronephrosis. No periureteric edema or fluid to suggest ureteral injury. 2. Basilar atelectasis bilaterally. These findings were discussed with Dr. Berline Lopes at the time of initial study interpretation. Electronically Signed   By: Misty Stanley M.D.   On: 02/27/2019 14:34     Scheduled Meds:  Chlorhexidine Gluconate Cloth  6 each Topical Daily   heparin  5,000 Units Subcutaneous Q8H   levothyroxine  88 mcg Oral QAC breakfast   Continuous Infusions:    LOS: 0 days    Time spent: Spent more than 30 minutes in coordinating care for this patient including bedside patient care.    Lucky Cowboy, MD [facility] If 7PM-7AM, please contact night-coverage 02/28/2019, 3:39 PM

## 2019-02-28 NOTE — Progress Notes (Signed)
Notified Lab that ABG being sent for analysis. This ABG was drawn on room air. 

## 2019-02-28 NOTE — Progress Notes (Signed)
Spoke with Kandace Blitz, NP with neurology about consultation given persistent aphasia post-operatively.  Return call received from Dr. Lorraine Lax.  Recommendation is for repeat PCO2.  If abnormal/still elevated, consider metabolic encephalopathy as cause.  If normal, consider neuro consultation. Also advised to hold sedating medications if any.

## 2019-03-01 DIAGNOSIS — G9340 Encephalopathy, unspecified: Secondary | ICD-10-CM

## 2019-03-01 DIAGNOSIS — J9601 Acute respiratory failure with hypoxia: Secondary | ICD-10-CM

## 2019-03-01 LAB — CBC WITH DIFFERENTIAL/PLATELET
Abs Immature Granulocytes: 0.02 10*3/uL (ref 0.00–0.07)
Basophils Absolute: 0 10*3/uL (ref 0.0–0.1)
Basophils Relative: 1 %
Eosinophils Absolute: 0.4 10*3/uL (ref 0.0–0.5)
Eosinophils Relative: 5 %
HCT: 41.1 % (ref 36.0–46.0)
Hemoglobin: 13.8 g/dL (ref 12.0–15.0)
Immature Granulocytes: 0 %
Lymphocytes Relative: 13 %
Lymphs Abs: 1 10*3/uL (ref 0.7–4.0)
MCH: 30.5 pg (ref 26.0–34.0)
MCHC: 33.6 g/dL (ref 30.0–36.0)
MCV: 90.7 fL (ref 80.0–100.0)
Monocytes Absolute: 0.8 10*3/uL (ref 0.1–1.0)
Monocytes Relative: 11 %
Neutro Abs: 5 10*3/uL (ref 1.7–7.7)
Neutrophils Relative %: 70 %
Platelets: 174 10*3/uL (ref 150–400)
RBC: 4.53 MIL/uL (ref 3.87–5.11)
RDW: 13 % (ref 11.5–15.5)
WBC: 7.2 10*3/uL (ref 4.0–10.5)
nRBC: 0 % (ref 0.0–0.2)

## 2019-03-01 LAB — GLUCOSE, CAPILLARY
Glucose-Capillary: 80 mg/dL (ref 70–99)
Glucose-Capillary: 82 mg/dL (ref 70–99)

## 2019-03-01 NOTE — Discharge Summary (Signed)
Physician Discharge Summary  [name] [id number] DOB: 1960-05-21 DOA: 02/26/2019  PCP: [name], MD  Admit date: 02/26/2019 Discharge date: 03/01/2019  Admitted From: Home Disposition:  home  Recommendations for Outpatient Follow-up:  1. Follow up with PCP in 1-2 weeks.  Discussed with patient she will need a sleep study as an outpatient. 2.   Gabapentin, chlorpheniramine, oxycodone discontinued due to risk of altered mentation. These can be restarted as deemed appropriate by her outpatient physician.   Home Health: No Equipment/Devices: None  Discharge Condition: Stable CODE STATUS: Full code Diet recommendation: Regular diet  Brief/Interim Summary: 58 year old female with PMH of breast cancer, non-Hodgkin's lymphoma, recent bilateral oophorectomy and hysterectomy on 02/25/2019 admitted for drowsiness.  Encephalopathy was thought to be metabolic in setting of elevated carbon dioxide.  Etiology for carbon dioxide narcosis unclear but thought to be medication (gabapentin, anesthesia) induced in setting of AKI.  AKI presumed to be in setting of volume depletion and resolved with IV fluid resuscitation.  CTA chest was negative for PE.  Gynecology oncology was on board given recent surgery and recommended CT abdomen pelvis to rule out post operative complication which was negative.  She had a CT head and MRI brain both of which were negative.  Gynecology oncology consulted neurology who cleared patient for discharge. Encephalopathy improved on BiPAP.  On subsequent days, she had some word finding difficulty and delayed cognition but this is much improved and the patient is ready to be discharged.  Discharge Diagnoses:  Principal Problem:   Acute encephalopathy Active Problems:   Thyroid disease   Lymphoproliferative disorder, low grade B cell (HCC)   Acute respiratory failure with hypoxia and hypercarbia (HCC)   AKI (acute kidney injury) [facility])    Discharge  Instructions:  Discharge Instructions    Call MD for:  persistant dizziness or light-headedness   Complete by: As directed    Diet - low sodium heart healthy   Complete by: As directed    Discharge instructions   Complete by: As directed    Please do not take gabapentin or chlorpheniramine (allergy medicine) until you see your PCP. Also avoid oxycodone if possible. Use ibuprofen sparingly. Please see your PCP in 1-2 weeks to schedule a sleep study.   Increase activity slowly   Complete by: As directed      Allergies as of 03/01/2019   No Known Allergies     Medication List    STOP taking these medications   chlorpheniramine 4 MG tablet Commonly known as: CHLOR-TRIMETON   gabapentin 300 MG capsule Commonly known as: NEURONTIN   oxyCODONE 5 MG immediate release tablet Commonly known as: Oxy IR/ROXICODONE     TAKE these medications   alendronate 35 MG tablet Commonly known as: FOSAMAX Take 1 tablet (35 mg total) by mouth every Wednesday. Take with a full glass of water on an empty stomach.   anastrozole 1 MG tablet Commonly known as: ARIMIDEX Take 1 tablet (1 mg total) by mouth daily.   citalopram 40 MG tablet Commonly known as: CELEXA Take 1 tablet (40 mg total) by mouth daily.   fluticasone 50 MCG/ACT nasal spray Commonly known as: FLONASE Place 1 spray into both nostrils at bedtime.   ibuprofen 800 MG tablet Commonly known as: ADVIL Take 1 tablet (800 mg total) by mouth every 8 (eight) hours as needed for moderate pain. For AFTER surgery only   levothyroxine 88 MCG tablet Commonly known as: SYNTHROID Take 88 mcg by mouth daily  before breakfast.   meloxicam 7.5 MG tablet Commonly known as: Mobic Take 2 tablets (15 mg total) by mouth daily.   pravastatin 40 MG tablet Commonly known as: PRAVACHOL Take 1 tablet (40 mg total) by mouth at bedtime.   senna-docusate 8.6-50 MG tablet Commonly known as: Senokot-S Take 2 tablets by mouth at bedtime. For AFTER  surgery, do not take if having diarrhea       No Known Allergies  Consultations:  Gynecology oncology, neurology   Procedures/Studies: DG Chest 2 View  Result Date: 02/26/2019 CLINICAL DATA:  Shortness of breath, post salpingo-oophorectomy performed yesterday, increasing shortness of breath EXAM: CHEST - 2 VIEW COMPARISON:  Most recent comparison November 11, 2010 FINDINGS: Some low lung volumes with streaky basilar atelectatic changes. No convincing consolidative opacity, pneumothorax or effusion. No suggestive features of edema. Cardiomediastinal contours are stable from prior. Degenerative changes are present in the imaged spine and shoulders. Mild dextrocurvature of the midthoracic spine, similar to priors. Bilateral axillary surgical clips likely reflect prior nodal dissection. No acute osseous or soft tissue abnormality. IMPRESSION: Low lung volumes with streaky bibasilar atelectatic changes. No other acute cardiopulmonary abnormality. Electronically Signed   By: Lovena Le M.D.   On: 02/26/2019 23:22   CT HEAD WO CONTRAST  Result Date: 02/27/2019 CLINICAL DATA:  Mental status change. Altered mental status. History of breast cancer. EXAM: CT HEAD WITHOUT CONTRAST TECHNIQUE: Contiguous axial images were obtained from the base of the skull through the vertex without intravenous contrast. COMPARISON:  None. FINDINGS: Brain: No evidence of acute infarction, hemorrhage, hydrocephalus, extra-axial collection or mass lesion/mass effect. Vascular: Negative for hyperdense vessel Skull: Negative Sinuses/Orbits: Prior surgical plate and screw fixation of the maxilla bilaterally. Extensive mucoperiosteal thickening in the maxillary sinus bilaterally. Negative orbit Other: None IMPRESSION: Negative CT of the brain. Electronically Signed   By: Franchot Gallo M.D.   On: 02/27/2019 14:08   CT Angio Chest PE W and/or Wo Contrast  Result Date: 02/27/2019 CLINICAL DATA:  Shortness of breath.  Salpingo-oophorectomy 2 days ago. EXAM: CT ANGIOGRAPHY CHEST WITH CONTRAST TECHNIQUE: Multidetector CT imaging of the chest was performed using the standard protocol during bolus administration of intravenous contrast. Multiplanar CT image reconstructions and MIPs were obtained to evaluate the vascular anatomy. CONTRAST:  101mL OMNIPAQUE IOHEXOL 350 MG/ML SOLN COMPARISON:  Chest radiograph yesterday. Chest CT 02/21/2018 FINDINGS: Cardiovascular: There are no filling defects within the pulmonary arteries to suggest pulmonary embolus. The thoracic aorta is normal in caliber. Mild aortic atherosclerosis. No dissection. However a right subclavian artery courses posterior to the esophagus. Upper normal heart size. No pericardial effusion. Mediastinum/Nodes: Fluid-filled esophagus. No enlarged mediastinal or hilar lymph nodes. No visualized thyroid nodule. Lungs/Pleura: Imaging obtained in expiration with heterogeneous pulmonary parenchyma. No focal airspace disease. No pleural effusion. No evidence of pulmonary edema. No pulmonary mass. No pulmonary nodule. Upper Abdomen: Fluid-filled distended stomach is partially included. Small foci of free air consistent with recent abdominal surgery. Interposition of colon posterior to the liver. Musculoskeletal: Surgical clips in the right breast and axilla. There are no acute or suspicious osseous abnormalities. Review of the MIP images confirms the above findings. IMPRESSION: 1. No pulmonary embolus. 2. Fluid-filled mid and distal esophagus with dilated fluid-filled stomach, partially included, unknown etiology. No evidence of esophageal or gastric wall thickening. Electronically Signed   By: Keith Rake M.D.   On: 02/27/2019 02:14   MR BRAIN WO CONTRAST  Result Date: 02/27/2019 CLINICAL DATA:  Somnolent, hypercarbic.  Rule out  stroke. EXAM: MRI HEAD WITHOUT CONTRAST TECHNIQUE: Multiplanar, multiecho pulse sequences of the brain and surrounding structures were obtained  without intravenous contrast. COMPARISON:  CT head fall 16 2020 FINDINGS: Brain: Negative for acute infarct. Few small white matter hyperintensities bilaterally. Brainstem normal. Negative for hemorrhage or mass. Vascular: Normal arterial flow voids Skull and upper cervical spine: No focal skeletal abnormality. Sinuses/Orbits: Mucosal edema paranasal sinuses.  Normal orbit Other: None IMPRESSION: No acute abnormality. Mild white matter changes most likely due to chronic microvascular ischemia. Electronically Signed   By: Franchot Gallo M.D.   On: 02/27/2019 21:45   CT ABDOMEN PELVIS W CONTRAST  Result Date: 02/27/2019 CLINICAL DATA:  Postoperative day 1 from robotic BSO. Mental status changes and new elevated creatinine. EXAM: CT ABDOMEN AND PELVIS WITH CONTRAST TECHNIQUE: Multidetector CT imaging of the abdomen and pelvis was performed using the standard protocol following bolus administration of intravenous contrast. CONTRAST:  7mL OMNIPAQUE IOHEXOL 300 MG/ML  SOLN COMPARISON:  02/21/2018 FINDINGS: Lower chest: Dependent basilar atelectasis without airspace consolidation or pleural effusion. Hepatobiliary: No suspicious focal abnormality within the liver parenchyma. There is no evidence for gallstones, gallbladder wall thickening, or pericholecystic fluid. No intrahepatic or extrahepatic biliary dilation. Pancreas: No focal mass lesion. No dilatation of the main duct. No intraparenchymal cyst. No peripancreatic edema. Spleen: No splenomegaly. No focal mass lesion. Adrenals/Urinary Tract: No adrenal nodule or mass. Kidneys unremarkable. Tiny hypodensities in the lower pole right kidney are too small to characterize but likely benign. No hydroureteronephrosis. No periureteric edema or fluid. The urinary bladder appears normal for the degree of distention. Stomach/Bowel: Stomach is unremarkable. No gastric wall thickening. No evidence of outlet obstruction. Duodenum is normally positioned as is the ligament of  Treitz. No small bowel wall thickening. No small bowel dilatation. Cecum is high in the right abdomen. The terminal ileum is normal. The appendix is normal. No gross colonic mass. No colonic wall thickening. Mild diverticular disease in the colon is right-side predominant. Vascular/Lymphatic: There is abdominal aortic atherosclerosis without aneurysm. There is no gastrohepatic or hepatoduodenal ligament lymphadenopathy. No retroperitoneal or mesenteric lymphadenopathy. Upper normal lymph nodes are seen in the hepato duodenal ligament. No pelvic sidewall lymphadenopathy. Small right external iliac nodes are stable. Upper normal right common femoral lymph node is stable since prior study and most likely reactive. Reproductive: Uterus unremarkable.  There is no adnexal mass. Other: No intraperitoneal free fluid. Trace intraperitoneal free air noted anterior to the liver, not unexpected on postoperative day 1. Musculoskeletal: There is some gas in the subcutaneous fat of the left anterior abdominal wall, compatible surgery yesterday. No worrisome lytic or sclerotic osseous abnormality. IMPRESSION: 1. No evidence for surgical complication. There is no intraperitoneal free fluid or evidence of pelvic hematoma. No hydroureteronephrosis. No periureteric edema or fluid to suggest ureteral injury. 2. Basilar atelectasis bilaterally. These findings were discussed with Dr. Berline Lopes at the time of initial study interpretation. Electronically Signed   By: Misty Stanley M.D.   On: 02/27/2019 14:34   MM 3D SCREEN BREAST BILATERAL  Result Date: 02/22/2019 CLINICAL DATA:  Screening. EXAM: DIGITAL SCREENING BILATERAL MAMMOGRAM WITH TOMO AND CAD COMPARISON:  Previous exam(s). ACR Breast Density Category b: There are scattered areas of fibroglandular density. FINDINGS: There are no findings suspicious for malignancy. Images were processed with CAD. IMPRESSION: No mammographic evidence of malignancy. A result letter of this screening  mammogram will be mailed directly to the patient. RECOMMENDATION: Screening mammogram in one year. (Code:[id number]) BI-RADS CATEGORY  1: Negative.  Electronically Signed   By: Ammie Ferrier M.D.   On: 02/22/2019 14:47      Subjective:   Discharge Exam: Vitals:   03/01/19 1000 03/01/19 1100  BP:    Pulse: (!) 101 75  Resp: 18 18  Temp:  98.3 F (36.8 C)  SpO2: 100% 95%   Vitals:   03/01/19 0741 03/01/19 0800 03/01/19 1000 03/01/19 1100  BP:      Pulse:  70 (!) 101 75  Resp:  17 18 18   Temp: 98.2 F (36.8 C)   98.3 F (36.8 C)  TempSrc: Oral   Oral  SpO2:  93% 100% 95%  Weight:      Height:        General: Pt is alert, awake, not in acute distress Cardiovascular: RRR, S1/S2 +, no rubs, no gallops Respiratory: CTA bilaterally, no wheezing, no rhonchi Abdominal: Soft, NT, ND, bowel sounds + Extremities: no edema, no cyanosis    The results of significant diagnostics from this hospitalization (including imaging, microbiology, ancillary and laboratory) are listed below for reference.     Microbiology: Recent Results (from the past 240 hour(s))  Novel Coronavirus, NAA (Hosp order, Send-out to Ref Lab; TAT 18-24 hrs     Status: None   Collection Time: 02/21/19  4:17 PM   Specimen: Nasopharyngeal Swab; Respiratory  Result Value Ref Range Status   SARS-CoV-2, NAA NOT DETECTED NOT DETECTED Final    Comment: (NOTE) This nucleic acid amplification test was developed and its performance characteristics determined by [employer]. Nucleic acid amplification tests include PCR and TMA. This test has not been FDA cleared or approved. This test has been authorized by FDA under an Emergency Use Authorization (EUA). This test is only authorized for the duration of time the declaration that circumstances exist justifying the authorization of the emergency use of in vitro diagnostic tests for detection of SARS-CoV-2 virus and/or diagnosis of COVID-19 infection under  section 564(b)(1) of the Act, 21 U.S.C. [id number]) (1), unless the authorization is terminated or revoked sooner. When diagnostic testing is negative, the possibility of a false negative result should be considered in the context of a patient's recent exposures and the presence of clinical signs and symptoms consistent with COVID-19. An individual without symptoms of COVID- 19 and who is not shedding SARS-CoV-2 vi rus would expect to have a negative (not detected) result in this assay. Performed At: [facility] [address] [place], [place] [id number] Rush Farmer MD [id number]    [place]  Final    Comment: Performed at [facility], [place] [address], [place], [place] [address]  SARS CORONAVIRUS 2 (TAT 6-24 HRS) Nasopharyngeal Nasopharyngeal Swab     Status: None   Collection Time: 02/27/19 12:35 AM   Specimen: Nasopharyngeal Swab  Result Value Ref Range Status   SARS Coronavirus 2 NEGATIVE NEGATIVE Final    Comment: (NOTE) SARS-CoV-2 target nucleic acids are NOT DETECTED. The SARS-CoV-2 RNA is generally detectable in upper and lower respiratory specimens during the acute phase of infection. Negative results do not preclude SARS-CoV-2 infection, do not rule out co-infections with other pathogens, and should not be used as the sole basis for treatment or other patient management decisions. Negative results must be combined with clinical observations, patient history, and epidemiological information. The expected result is Negative. Fact Sheet for Patients: SugarRoll.be Fact Sheet for Healthcare Providers: https://www.woods-mathews.com/ This test is not yet approved or cleared by the [place] FDA and  has been  authorized for detection and/or diagnosis of SARS-CoV-2 by FDA under an Emergency Use Authorization (EUA). This EUA will remain  in effect (meaning this test can be used) for the  duration of the COVID-19 declaration under Section 56 4(b)(1) of the Act, 21 U.S.C. section 360bbb-3(b)(1), unless the authorization is terminated or revoked sooner. Performed at [facility], [place] [address], [place], [place] [address]      Labs: BNP (last 3 results) No results for input(s): BNP in the last 8760 hours. Basic Metabolic Panel: Recent Labs  Lab 02/26/19 2336 02/28/19 0805  NA 133*  --   K 4.0  --   CL 96*  --   CO2 26  --   GLUCOSE 105*  --   BUN 23*  --   CREATININE 1.36* 0.65  CALCIUM 9.2  --    Liver Function Tests: Recent Labs  Lab 02/27/19 1420  AST 20  ALT 16  ALKPHOS 62  BILITOT 0.7  PROT 6.2*  ALBUMIN 3.6   No results for input(s): LIPASE, AMYLASE in the last 168 hours. Recent Labs  Lab 02/27/19 0750  AMMONIA 31   CBC: Recent Labs  Lab 02/26/19 2336 03/01/19 1340  WBC 11.5* 7.2  NEUTROABS 7.4 5.0  HGB 12.7 13.8  HCT 39.1 41.1  MCV 93.1 90.7  PLT 172 174   Cardiac Enzymes: No results for input(s): CKTOTAL, CKMB, CKMBINDEX, TROPONINI in the last 168 hours. BNP: Invalid input(s): POCBNP CBG: Recent Labs  Lab 02/28/19 1141 02/28/19 1750 02/28/19 2333 03/01/19 0619 03/01/19 1127  GLUCAP 89 92 98 80 82   D-Dimer Recent Labs    02/26/19 2336  DDIMER 0.61*   Hgb A1c No results for input(s): HGBA1C in the last 72 hours. Lipid Profile No results for input(s): CHOL, HDL, LDLCALC, TRIG, CHOLHDL, LDLDIRECT in the last 72 hours. Thyroid function studies Recent Labs    02/27/19 0750  TSH 0.929   Anemia work up No results for input(s): VITAMINB12, FOLATE, FERRITIN, TIBC, IRON, RETICCTPCT in the last 72 hours. Urinalysis    Component Value Date/Time   COLORURINE STRAW (A) 02/27/2019 0957   APPEARANCEUR CLEAR 02/27/2019 0957   LABSPEC 1.014 02/27/2019 0957   PHURINE 5.0 02/27/2019 0957   GLUCOSEU NEGATIVE 02/27/2019 0957   HGBUR NEGATIVE 02/27/2019 0957   BILIRUBINUR NEGATIVE 02/27/2019 0957   KETONESUR  NEGATIVE 02/27/2019 0957   PROTEINUR NEGATIVE 02/27/2019 0957   NITRITE NEGATIVE 02/27/2019 0957   LEUKOCYTESUR NEGATIVE 02/27/2019 0957   Sepsis Labs Invalid input(s): PROCALCITONIN,  WBC,  LACTICIDVEN Microbiology Recent Results (from the past 240 hour(s))  Novel Coronavirus, NAA (Hosp order, Send-out to Ref Lab; TAT 18-24 hrs     Status: None   Collection Time: 02/21/19  4:17 PM   Specimen: Nasopharyngeal Swab; Respiratory  Result Value Ref Range Status   SARS-CoV-2, NAA NOT DETECTED NOT DETECTED Final    Comment: (NOTE) This nucleic acid amplification test was developed and its performance characteristics determined by [employer]. Nucleic acid amplification tests include PCR and TMA. This test has not been FDA cleared or approved. This test has been authorized by FDA under an Emergency Use Authorization (EUA). This test is only authorized for the duration of time the declaration that circumstances exist justifying the authorization of the emergency use of in vitro diagnostic tests for detection of SARS-CoV-2 virus and/or diagnosis of COVID-19 infection under section 564(b)(1) of the Act, 21 U.S.C. [id number]) (1), unless the authorization is terminated or revoked sooner. When diagnostic  testing is negative, the possibility of a false negative result should be considered in the context of a patient's recent exposures and the presence of clinical signs and symptoms consistent with COVID-19. An individual without symptoms of COVID- 19 and who is not shedding SARS-CoV-2 vi rus would expect to have a negative (not detected) result in this assay. Performed At: [facility] [address] [place], [place] [id number] Rush Farmer MD [id number]    [place]  Final    Comment: Performed at [facility], [place] [address], [place], [place] [address]  SARS CORONAVIRUS 2 (TAT 6-24 HRS) Nasopharyngeal Nasopharyngeal Swab     Status: None    Collection Time: 02/27/19 12:35 AM   Specimen: Nasopharyngeal Swab  Result Value Ref Range Status   SARS Coronavirus 2 NEGATIVE NEGATIVE Final    Comment: (NOTE) SARS-CoV-2 target nucleic acids are NOT DETECTED. The SARS-CoV-2 RNA is generally detectable in upper and lower respiratory specimens during the acute phase of infection. Negative results do not preclude SARS-CoV-2 infection, do not rule out co-infections with other pathogens, and should not be used as the sole basis for treatment or other patient management decisions. Negative results must be combined with clinical observations, patient history, and epidemiological information. The expected result is Negative. Fact Sheet for Patients: SugarRoll.be Fact Sheet for Healthcare Providers: https://www.woods-mathews.com/ This test is not yet approved or cleared by the [place] FDA and  has been authorized for detection and/or diagnosis of SARS-CoV-2 by FDA under an Emergency Use Authorization (EUA). This EUA will remain  in effect (meaning this test can be used) for the duration of the COVID-19 declaration under Section 56 4(b)(1) of the Act, 21 U.S.C. section 360bbb-3(b)(1), unless the authorization is terminated or revoked sooner. Performed at [facility], [place] [address], [place], [place] [address]      Time coordinating discharge: Over 30 minutes  SIGNED:   Lucky Cowboy, MD  [facility] 03/01/2019, 2:56 PM  If 7PM-7AM, please contact night-coverage

## 2019-03-01 NOTE — Progress Notes (Signed)
Patients blood pressure has been consistently high since yesterday. I asked patient if this is normal and she told me she has always had white coat syndrome and that her anxiety and blood pressures have been elevated in the hospital due to that. The patient on observation can not sit still, and appears to be anxious. She is constantly fidgeting with her arms, her hair, and her face. I have asked her basic questions about how she is at home and she told me that her family has not eaten out or really gone anywhere since March due to Covid. I asked her if she feels like her anxiety has increased due to how things have been during the pandemic. Her fidgeting became worse, and she kept talking in circles about how they don't trust eating out, or even eating other people's food and that she no longer likes to go places. I know her appetite has not been great since being back on a regular diet and she says that she does not like eating the food here. The patient says she has a blood pressure cuff at home although she never checks it and only has it checked at the doctor. I will mention the above things to day shift nurse but I think once she goes home monitoring her own blood pressure for a few days may be beneficial.

## 2019-03-01 NOTE — Progress Notes (Signed)
GYNECOLOGY ONCOLOGY  Subjective: Patient and husband report improvement.  Husband states he feels she is at 80-90%. He was testing her with spelling certain words backwards.  Tolerating diet and passing flatus.  Denies chest pain, dyspnea, nausea, abdominal pain.  Asking about going home. No other concerns voiced.   Objective: Vital signs in last 24 hours: Temp:  [98.2 F (36.8 C)-99 F (37.2 C)] 98.3 F (36.8 C) (12/18 1100) Pulse Rate:  [70-101] 75 (12/18 1100) Resp:  [10-21] 18 (12/18 1100) BP: (156-171)/(90-128) 171/128 (12/18 0540) SpO2:  [92 %-100 %] 95 % (12/18 1100) Last BM Date: 02/24/19  Intake/Output from previous day: 12/17 0701 - 12/18 0700 In: 240 [P.O.:240] Out: 450 [Urine:450]  Physical Examination: General: alert, cooperative and no distress Resp: clear to auscultation bilaterally Cardio: regular rate and rhythm, S1, S2 normal, no murmur, click, rub or gallop GI: incision: lap sites to the abdomen with mild ecchymosis, no drainage or erythema noted and abdomen soft, active bowel sounds, non tender Extremities: extremities normal, atraumatic, no cyanosis or edema    Labs: Arterial blood gas performed 02/28/2019  CT AP 02/27/2019: No evidence of surgical complication, basilar atelectasis bilaterally. CT Head without contrast 02/27/2019: negative MRI brain 02/27/2019 to rule out brain stem stroke: No acute abnormality  Assessment: 58 y.o. currently admitted with neurologic symptoms and hypercarbia s/p uncomplicated robotic assisted bilateral salpingo-oophorectomy on 02/25/2019 for BRCA mutation.  Pain:  No pain reported.  Heme: Hgb 12.7 and Hct 39.1 on 02/26/2019.   ID: WBC 11.5 on 02/26/2019.  CV: BP and HR stable.   GI:  Tolerating po: Yes. No nausea reported.  GU: Adequate amount recorded.    FEN: No critical values on 12/15  Prophylaxis: Heparin ordered SQ.  Plan: CBC to re-evaluate WBC count From surgical standpoint, patient is cleared for  discharge Continue plan of care per Hospitalist   LOS: 1 day    Dorothyann Gibbs 03/01/2019, 12:44 PM

## 2019-03-01 NOTE — Consult Note (Addendum)
NEURO HOSPITALIST CONSULT NOTE   Requesting physician: GYN/ONC   Reason for Consult: difficulty finding words   History obtained from:  Patient  / Chart  review  HPI:                                                                                                                                          [name] is an 58 y.o. 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 Neurology consulted 12/17 for persistent aphasia.   Per patient she has had intermittent word finding difficulty since chemo in 2012. She states that she is back to baseline, but her husband noticed when she was admitted that she was having more difficulty than usual finding her words. Denies any CP, focal weakness, HA, numbness, tingling.   12/15 Admitted for somnolence: ABG: PH: 7.285 Pc02:58.5 BMP: Na: 133, Bun: 23, creatinine 1.36 12/16 CTH: negative for anything acute, MRI: negative for stroke, TSH: WNL, Ammonia: WNL, UDS: negative,  UA: negative  12/17: Neurology consulted; ABG: PH: 7.422, P02: 71.4, Bicarb: 30.3, blood transfusion, creatinine: 1.36   Past Medical History:  Diagnosis Date   Anxiety    Arthritis    BRCA2 gene mutation positive in female    De Quervain's syndrome (tenosynovitis)    Left wrist   Depression    Family history of BRCA2 gene positive    Fibromyalgia    Follicular lymphoma grade 3a [facility]) oncologist-- dr Jana Hakim   dx 03-02-2018  s/p left axilly node dissection (NHL),  no further treatment other than active survillance   History of gout    02-21-2019  per pt has years since last episode   History of radiation therapy    RIGHT BREAST COMPLETED 12/ 2012   History of right breast cancer oncologist--  dr Jana Hakim--  no recurrence   dx 04/ 2012--  IDC,  Stage IIA, Grade 3, 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);  07-08-2010 s/p breast lumpecotmy w/ node dissection;  completed chemoradiation 12/ 2012   History of TMJ syndrome    Hyperlipidemia    Hypothyroidism    Neuropathy due to chemotherapeutic drug ([place])    bilateral fingers and toes   Osteopenia 08/09/2011   Peripheral neuropathy    secondary to chemo   Personal history of chemotherapy    COMPLETED 12/ 2012   Personal history of radiation therapy    Wears glasses     Past Surgical History:  Procedure Laterality Date   BREAST EXCISIONAL BIOPSY Left 2019   BREAST LUMPECTOMY Right 2012   BREAST LUMPECTOMY WITH AXILLARY LYMPH NODE BIOPSY Right 07/08/2010   W/  PAC INSERTION  MANDIBLE SURGERY  1991   due to accident as a child. sx to straighten dental bite.   PORT-A-CATH REMOVAL  12/28/2010   RADIOACTIVE SEED GUIDED AXILLARY SENTINEL LYMPH NODE Left 03/02/2018   Procedure: SEED TARGETED DEEP LEFT AXILLARY LYMPH NODE BIOPSY;  Surgeon: [name], MD;  Location: [facility];  Service: General;  Laterality: Left;   ROBOTIC ASSISTED SALPINGO OOPHERECTOMY Bilateral 02/25/2019   Procedure: XI ROBOTIC ASSISTED SALPINGO OOPHORECTOMY;  Surgeon: Everitt Amber, MD;  Location: [facility];  Service: Gynecology;  Laterality: Bilateral;    Family History  Problem Relation Age of Onset   Cancer Maternal Aunt 79       colon - great maternal aunt   Cancer Paternal Aunt 5       gyn- unsure if ovarian or uterine- met to lung   Breast cancer Maternal Aunt 1   Breast cancer Mother 5   Prostate cancer Paternal Uncle        dx >50   Heart disease Maternal Grandmother 58   Pneumonia Maternal Grandfather 35   Lung cancer Paternal Grandmother 35   Leukemia Paternal Grandfather 75   Pancreatic cancer Paternal Aunt 37   Bladder Cancer Paternal Aunt 78   Lung cancer Paternal Aunt    Other Cousin        BRCA2 +   Breast cancer Cousin        dx <50   Colon cancer Neg Hx             Social  History:  reports that she has never smoked. She has never used smokeless tobacco. She reports previous alcohol use. She reports that she does not use drugs.  No Known Allergies  MEDICATIONS:                                                                                                                     Scheduled:  Chlorhexidine Gluconate Cloth  6 each Topical Daily   enoxaparin (LOVENOX) injection  40 mg Subcutaneous Q24H   levothyroxine  88 mcg Oral QAC breakfast   Continuous:   QMG:[id number] **OR** acetaminophen, ondansetron **OR** ondansetron (ZOFRAN) IV   ROS:                                                                                                                                       ROS was performed and is negative except as  noted in HPI    Blood pressure (!) 171/128, pulse 90, temperature 98.2 F (36.8 C), temperature source Oral, resp. rate (!) 21, height '5\' 4"'$  (1.626 m), weight 90.7 kg, SpO2 96 %.   General Examination:                                                                                                       Physical Exam  Constitutional: Appears well-developed and well-nourished.  Psych: Affect appropriate to situation Eyes: Normal external eye and conjunctiva. HENT: Normocephalic, no lesions, without obvious abnormality.   Musculoskeletal-no joint tenderness, deformity or swelling Cardiovascular: Normal rate and regular rhythm.  Respiratory: Effort normal, non-labored breathing saturations WNL on RA GI: Soft.  No distension. There is no tenderness.  Skin: WDI  Neurological Examination Mental Status: Alert, oriented, thought content appropriate. Repetition intact. Was unable to tell me how many quarters in $2.75 and could not spell WORLD backwards.  Speech fluent without evidence of aphasia.  Able to follow  commands without difficulty. Cranial Nerves: II: Visual fields grossly normal,  III,IV, VI: ptosis not present, extra-ocular  motions intact bilaterally pupils equal, round, reactive to light and accommodation V,VII: smile symmetric, facial light touch sensation normal bilaterally VIII: hearing normal bilaterally IX,X: uvula rises symmetrically XI: bilateral shoulder shrug XII: midline tongue extension Motor: Right : Upper extremity   5/5  Left:     Upper extremity   5/5  Lower extremity   5/5   Lower extremity   5/5 Tone and bulk:normal tone throughout; no atrophy noted Sensory: Pinprick and light touch intact throughout, bilaterally Deep Tendon Reflexes: 2+ and symmetric biceps and patella Plantars: Right: downgoing   Left: downgoing Cerebellar: No ataxia noted Gait:  deferred   Lab Results: Basic Metabolic Panel: Recent Labs  Lab 02/26/19 2336 02/28/19 0805  NA 133*  --   K 4.0  --   CL 96*  --   CO2 26  --   GLUCOSE 105*  --   BUN 23*  --   CREATININE 1.36* 0.65  CALCIUM 9.2  --     CBC: Recent Labs  Lab 02/26/19 2336  WBC 11.5*  NEUTROABS 7.4  HGB 12.7  HCT 39.1  MCV 93.1  PLT 172   Imaging: CT HEAD WO CONTRAST  Result Date: 02/27/2019 CLINICAL DATA:  Mental status change. Altered mental status. History of breast cancer. EXAM: CT HEAD WITHOUT CONTRAST TECHNIQUE: Contiguous axial images were obtained from the base of the skull through the vertex without intravenous contrast. COMPARISON:  None. FINDINGS: Brain: No evidence of acute infarction, hemorrhage, hydrocephalus, extra-axial collection or mass lesion/mass effect. Vascular: Negative for hyperdense vessel Skull: Negative Sinuses/Orbits: Prior surgical plate and screw fixation of the maxilla bilaterally. Extensive mucoperiosteal thickening in the maxillary sinus bilaterally. Negative orbit Other: None IMPRESSION: Negative CT of the brain. Electronically Signed   By: Franchot Gallo M.D.   On: 02/27/2019 14:08   MR BRAIN WO CONTRAST  Result Date: 02/27/2019 CLINICAL DATA:  Somnolent, hypercarbic.  Rule out stroke. EXAM: MRI HEAD  WITHOUT CONTRAST TECHNIQUE: Multiplanar,  multiecho pulse sequences of the brain and surrounding structures were obtained without intravenous contrast. COMPARISON:  CT head fall 16 2020 FINDINGS: Brain: Negative for acute infarct. Few small white matter hyperintensities bilaterally. Brainstem normal. Negative for hemorrhage or mass. Vascular: Normal arterial flow voids Skull and upper cervical spine: No focal skeletal abnormality. Sinuses/Orbits: Mucosal edema paranasal sinuses.  Normal orbit Other: None IMPRESSION: No acute abnormality. Mild white matter changes most likely due to chronic microvascular ischemia. Electronically Signed   By: Franchot Gallo M.D.   On: 02/27/2019 21:45   CT ABDOMEN PELVIS W CONTRAST  Result Date: 02/27/2019 CLINICAL DATA:  Postoperative day 1 from robotic BSO. Mental status changes and new elevated creatinine. EXAM: CT ABDOMEN AND PELVIS WITH CONTRAST TECHNIQUE: Multidetector CT imaging of the abdomen and pelvis was performed using the standard protocol following bolus administration of intravenous contrast. CONTRAST:  42m OMNIPAQUE IOHEXOL 300 MG/ML  SOLN COMPARISON:  02/21/2018 FINDINGS: Lower chest: Dependent basilar atelectasis without airspace consolidation or pleural effusion. Hepatobiliary: No suspicious focal abnormality within the liver parenchyma. There is no evidence for gallstones, gallbladder wall thickening, or pericholecystic fluid. No intrahepatic or extrahepatic biliary dilation. Pancreas: No focal mass lesion. No dilatation of the main duct. No intraparenchymal cyst. No peripancreatic edema. Spleen: No splenomegaly. No focal mass lesion. Adrenals/Urinary Tract: No adrenal nodule or mass. Kidneys unremarkable. Tiny hypodensities in the lower pole right kidney are too small to characterize but likely benign. No hydroureteronephrosis. No periureteric edema or fluid. The urinary bladder appears normal for the degree of distention. Stomach/Bowel: Stomach is  unremarkable. No gastric wall thickening. No evidence of outlet obstruction. Duodenum is normally positioned as is the ligament of Treitz. No small bowel wall thickening. No small bowel dilatation. Cecum is high in the right abdomen. The terminal ileum is normal. The appendix is normal. No gross colonic mass. No colonic wall thickening. Mild diverticular disease in the colon is right-side predominant. Vascular/Lymphatic: There is abdominal aortic atherosclerosis without aneurysm. There is no gastrohepatic or hepatoduodenal ligament lymphadenopathy. No retroperitoneal or mesenteric lymphadenopathy. Upper normal lymph nodes are seen in the hepato duodenal ligament. No pelvic sidewall lymphadenopathy. Small right external iliac nodes are stable. Upper normal right common femoral lymph node is stable since prior study and most likely reactive. Reproductive: Uterus unremarkable.  There is no adnexal mass. Other: No intraperitoneal free fluid. Trace intraperitoneal free air noted anterior to the liver, not unexpected on postoperative day 1. Musculoskeletal: There is some gas in the subcutaneous fat of the left anterior abdominal wall, compatible surgery yesterday. No worrisome lytic or sclerotic osseous abnormality. IMPRESSION: 1. No evidence for surgical complication. There is no intraperitoneal free fluid or evidence of pelvic hematoma. No hydroureteronephrosis. No periureteric edema or fluid to suggest ureteral injury. 2. Basilar atelectasis bilaterally. These findings were discussed with Dr. TBerline Lopesat the time of initial study interpretation. Electronically Signed   By: EMisty StanleyM.D.   On: 02/27/2019 14:34    Assessment:  Claire KNYCOLE KAWAHARAis an 58y.o. 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  Neurology consulted 12/17 for persistent  Word finding difficulty. On exam today, patient reported that she feels back to baseline and did not display any word finding difficulty on my exam. Non-focal exam. [facility] for anything acute.  Impression: Acute encephalopathy  due to hypercapnia  Recommendations: -- no further recommendations at this time, please call with any further questions or concerns.  Laurey Morale, MSN, NP-C [facility] [phone]  Attending neurologist's note to follow   03/01/2019, 8:44 AM  NEUROHOSPITALIST ADDENDUM Performed a face to face diagnostic evaluation.   I have reviewed the contents of history and physical exam as documented by PA/ARNP/Resident and agree with above documentation.  I have discussed and formulated the above plan as documented. Edits to the note have been made as needed.  Has long standing hsitory of intermittent difficult with some words during sentances, brought to ED after husband concerned about lethargy, confusion as well as apnea. Noted to be hypercarbic, language back to normal today. MRI brain negative for acute findings ( while patient still had symptoms)  No evidence of aphasia on exam. Likely encephalopathy, slow speech in the setting of hypercarbia. Also on Neurontin, received pain meds, post surgery which could exacerbate symptoms.      Karena Addison Donta Fuster MD [facility] [phone]   If 7pm to 7am, please call on call as listed on AMION.

## 2019-03-04 ENCOUNTER — Telehealth: Payer: Self-pay

## 2019-03-04 NOTE — Telephone Encounter (Signed)
Following up on how she is doing since discharge from the hospital 03-01-19 for altered mental status  S/P Robotic BSO on 02-15-19. Requested that [name] call back to the office to give an update on her condition.

## 2019-03-11 ENCOUNTER — Ambulatory Visit
Admission: RE | Admit: 2019-03-11 | Discharge: 2019-03-11 | Disposition: A | Discharge status: Home / Self Care | Payer: BLUE CROSS/BLUE SHIELD | Source: Ambulatory Visit | Attending: Oncology | Admitting: Oncology

## 2019-03-11 ENCOUNTER — Encounter: Payer: Self-pay | Admitting: Oncology

## 2019-03-11 DIAGNOSIS — C50911 Malignant neoplasm of unspecified site of right female breast: Secondary | ICD-10-CM

## 2019-03-11 DIAGNOSIS — C50411 Malignant neoplasm of upper-outer quadrant of right female breast: Secondary | ICD-10-CM

## 2019-03-11 DIAGNOSIS — Z1501 Genetic susceptibility to malignant neoplasm of breast: Secondary | ICD-10-CM

## 2019-03-11 DIAGNOSIS — C8234 Follicular lymphoma grade IIIa, lymph nodes of axilla and upper limb: Secondary | ICD-10-CM

## 2019-03-11 MED ORDER — GADOBUTROL 1 MMOL/ML IV SOLN
9.0000 mL | Freq: Once | INTRAVENOUS | Status: AC | PRN
  Administered 2019-03-11: 9 mL via INTRAVENOUS

## 2019-03-21 ENCOUNTER — Other Ambulatory Visit: Payer: Self-pay

## 2019-03-21 ENCOUNTER — Inpatient Hospital Stay: Payer: BC Managed Care – PPO | Attending: Gynecologic Oncology | Admitting: Gynecologic Oncology

## 2019-03-21 ENCOUNTER — Encounter: Payer: Self-pay | Admitting: Gynecologic Oncology

## 2019-03-21 VITALS — BP 126/97 | HR 97 | Temp 97.8°F | Resp 17 | Ht 64.0 in | Wt 193.7 lb

## 2019-03-21 DIAGNOSIS — C50911 Malignant neoplasm of unspecified site of right female breast: Secondary | ICD-10-CM | POA: Insufficient documentation

## 2019-03-21 DIAGNOSIS — Z1502 Genetic susceptibility to malignant neoplasm of ovary: Secondary | ICD-10-CM | POA: Insufficient documentation

## 2019-03-21 DIAGNOSIS — Z148 Genetic carrier of other disease: Secondary | ICD-10-CM | POA: Diagnosis present

## 2019-03-21 DIAGNOSIS — Z90722 Acquired absence of ovaries, bilateral: Secondary | ICD-10-CM | POA: Insufficient documentation

## 2019-03-21 DIAGNOSIS — Z803 Family history of malignant neoplasm of breast: Secondary | ICD-10-CM | POA: Diagnosis not present

## 2019-03-21 DIAGNOSIS — Z1501 Genetic susceptibility to malignant neoplasm of breast: Secondary | ICD-10-CM

## 2019-03-21 DIAGNOSIS — Z9221 Personal history of antineoplastic chemotherapy: Secondary | ICD-10-CM | POA: Diagnosis not present

## 2019-03-21 DIAGNOSIS — Z923 Personal history of irradiation: Secondary | ICD-10-CM | POA: Insufficient documentation

## 2019-03-21 NOTE — Patient Instructions (Signed)
You are cleared for all activities after you surgery.  Dr Denman George recommends that you discuss obtaining a sleep study to evaluate for obstructive sleep apnea.

## 2019-03-21 NOTE — Progress Notes (Signed)
[place] at [facility]    Progress Note : Established Patient FOLLOW-UP    Consult was originally requested by Dr. Jana Hakim  CC:  Chief Complaint  Patient presents with  . Breast cancer, BRCA2 positive, right [facility])   Oncologic Summary: 1. BRCA2 mutation s/p risk reducing surgery 2. Personal family history of breast cancer; with possible ovarian cancer in one family member   Assessment/Plan: S/p BSO. Postop hypercarbia - recommend she obtain a sleep study given possiblity of OSA. Recommended she discuss this further with her PCP.   HPI: [name]  is a very nice 59 y.o.  P2  She has a personal history of breast cancer diagnosed in 2012.  In 2018 she lost her mother to breast cancer and coincidentally a cousin was diagnosed with breast cancer on her mother's side.  Therefore genetic testing 05/2017 was undertaken and the patient was found to have a BRCA2 pathogenic variant.  [employer] testing  is reviewed and she had a c.[id number](p.Leu2092Profs*7) Pathogenic Variant.  In addition a VUS was found in [facility] 3.  She has not had any screening GYN exams for some time including Pap.  She has not yet had transvaginal ultrasound or Ca125 for screening purposes.  She denies bloating, pelvic pain, changes in appetite, or unexpected weight loss.  Denies postmenopausal bleeding.  She does suffer from hot flashes but was restarted on anastrozole in 2019.  She does have a history of tamoxifen use for 2-1/2 years with her initial breast cancer treatment.  Interval Hx:  [place] on 07/21/18 showed a normal uterus measuring 7.7x3.1x4.1cm with a 4.9m thickness, normal ovaries bilaterally. CA 125 on 07/18/27 was normal at 31.6.  CT restaging for her breast cancer in December, 2019 showed normal gynecologic organs and no recurrent disease.   On 02/25/19 she underwent a robotic assisted BSO. Intraoperative findings were significant for bilaterally  normal-appearing tubes and ovaries. Surgery was uncomplicated she was discharged home the same day.  However on postoperative day 2 she was readmitted for altered mental status and hypersomnia.  After evaluation which included a thorough neuro work-up and ruling out for CVA she was determined to have metabolic issues with hypercarbia causing her somnolent state this recovered with supportive care.  Final pathology revealed benign tubes and ovaries bilaterally.  Since surgery done well with no specific complaints.   Measurement of disease:  Recent Labs    01/04/19 0842  CAN125 31.6   .     Oncologic History: See chart for her breast cancer history essentially she was treated with lumpectomy followed by radiation and chemotherapy with treatment completing in 2012.  [place] by tamoxifen for 2-1/2 years with the last dose of tamoxifen being February 2018.  She was restarted on anastrozole approximately April 2019.   Oncology History  Malignant neoplasm of upper-outer quadrant of right breast in female, estrogen receptor positive [place]  04/10/[place] Initial Diagnosis   Malignant ne[place] of upper-outer quadrant of right breast in [place], [place]   06/03/2017 Genetic Testing   The Common Hereditary Cancers Panel + Myelodysplastic syndrome/Leukemia [facility]. The following genes were evaluated for sequence changes and exonic deletions/duplications: APC, ATM, AXIN2, BARD1, BLM, BMPR1A, BRCA1, BRCA2, BRIP1, CDH1, CDK4, CDKN2A (p14ARF), CDKN2A (p16INK4a), CEBPA, CHEK2, CTNNA1, DICER1, EPCAM*, GATA2, GREM1*, HRAS, KIT, MEN1, MLH1, MSH2, MSH3, MSH6, MUTYH, NBN, NF1, PALB2, PDGFRA, PMS2, POLD1, POLE,  PTEN, RAD50, RAD51C, RAD51D, RUNX1, SDHB, SDHC, SDHD, SMAD4, SMARCA4, STK11, TERC, TERT, TP53, TSC1, TSC2, VHL. The following genes were evaluated for sequence changes only:HOXB13*, NTHL1*,  SDHA.  Results: Positive: Pathnogeic variant in BRCA2 c.[id number] (p.Leu2092Profs*7). A variant of uncertain significance in the gene MSH3 was also identified c.2695A>G (p.Met899Val).      Current Meds:  Outpatient Encounter Medications as of 03/21/2019  Medication Sig  . citalopram (CELEXA) 40 MG tablet Take 1 tablet (40 mg total) by mouth daily.  . fluticasone (FLONASE) 50 MCG/ACT nasal spray Place 1 spray into both nostrils at bedtime.   Marland Kitchen levothyroxine (SYNTHROID, LEVOTHROID) 88 MCG tablet Take 88 mcg by mouth daily before breakfast.   . meloxicam (MOBIC) 7.5 MG tablet Take 2 tablets (15 mg total) by mouth daily.  Marland Kitchen senna-docusate (SENOKOT-S) 8.6-50 MG tablet Take 2 tablets by mouth at bedtime. For AFTER surgery, do not take if having diarrhea  . ibuprofen (ADVIL) 800 MG tablet Take 1 tablet (800 mg total) by mouth every 8 (eight) hours as needed for moderate pain. For AFTER surgery only (Patient not taking: Reported on 03/21/2019)  . [DISCONTINUED] alendronate (FOSAMAX) 35 MG tablet Take 1 tablet (35 mg total) by mouth every Wednesday. Take with a full glass of water on an empty stomach. (Patient not taking: Reported on 03/21/2019)  . [DISCONTINUED] anastrozole (ARIMIDEX) 1 MG tablet Take 1 tablet (1 mg total) by mouth daily. (Patient not taking: Reported on 03/21/2019)  . [DISCONTINUED] pravastatin (PRAVACHOL) 40 MG tablet Take 1 tablet (40 mg total) by mouth at bedtime. (Patient not taking: Reported on 03/21/2019)   No facility-administered encounter medications on file as of 03/21/2019.    We did discuss holding fish oil and meloxicam prior to surgery.  She states she is not regularly taking aspirin but knows to hold that prior to surgery  Allergy: No Known Allergies  Social Hx:   Social History   Socioeconomic History  . Marital status: Married    Spouse name: Not on file  . Number of children: Not on file  . Years of education: Not on file  . Highest education level: Not on file   Occupational History  . Not on file  Tobacco Use  . Smoking status: Never Smoker  . Smokeless tobacco: Never Used  Substance and Sexual Activity  . Alcohol use: Not Currently  . Drug use: Never  . Sexual activity: Not on file  Other Topics Concern  . Not on file  Social History Narrative  . Not on file   Social Determinants of Health   Financial Resource Strain:   . Difficulty of Paying Living Expenses: Not on file  Food Insecurity:   . Worried About Charity fundraiser in the Last Year: Not on file  . Ran Out of Food in the Last Year: Not on file  Transportation Needs:   . Lack of Transportation (Medical): Not on file  . Lack of Transportation (Non-Medical): Not on file  Physical Activity:   . Days of Exercise per Week: Not on file  . Minutes of Exercise per Session: Not on file  Stress:   . Feeling of Stress : Not on file  Social Connections:   . Frequency of Communication with Friends and Family: Not on file  . Frequency of Social Gatherings with Friends and Family: Not on file  . Attends Religious Services: Not on file  . Active Member of Clubs or Organizations: Not on file  . Attends Archivist  Meetings: Not on file  . Marital Status: Not on file  Intimate Partner Violence:   . Fear of Current or Ex-Partner: Not on file  . Emotionally Abused: Not on file  . Physically Abused: Not on file  . Sexually Abused: Not on file    Past Surgical Hx:  Past Surgical History:  Procedure Laterality Date  . BREAST EXCISIONAL BIOPSY Left 2019  . BREAST LUMPECTOMY Right 2012  . BREAST LUMPECTOMY WITH AXILLARY LYMPH NODE BIOPSY Right 07/08/2010   W/  PAC INSERTION  . MANDIBLE SURGERY  1991   due to accident as a child. sx to straighten dental bite.  Marland Kitchen PORT-A-CATH REMOVAL  12/28/2010  . RADIOACTIVE SEED GUIDED AXILLARY SENTINEL LYMPH NODE Left 03/02/2018   Procedure: SEED TARGETED DEEP LEFT AXILLARY LYMPH NODE BIOPSY;  Surgeon: [name], MD;  Location: [facility];   Service: General;  Laterality: Left;  . ROBOTIC ASSISTED SALPINGO OOPHERECTOMY Bilateral 02/25/2019   Procedure: XI ROBOTIC ASSISTED SALPINGO OOPHORECTOMY;  Surgeon: Everitt Amber, MD;  Location: [facility];  Service: Gynecology;  Laterality: Bilateral;    Past Medical Hx:  Past Medical History:  Diagnosis Date  . Anxiety   . Arthritis   . BRCA2 gene mutation positive in female   . De Quervain's syndrome (tenosynovitis)    Left wrist  . Depression   . Family history of BRCA2 gene positive   . Fibromyalgia   . Follicular lymphoma grade 3a [facility]) oncologist-- dr Jana Hakim   dx 03-02-2018  s/p left axilly node dissection (NHL),  no further treatment other than active survillance  . History of gout    02-21-2019  per pt has years since last episode  . History of radiation therapy    RIGHT BREAST COMPLETED 12/ 2012  . 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 History of TMJ syndrome   . Hyperlipidemia   . Hypothyroidism   . 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 Osteopenia 08/09/2011  . 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 Personal history of chemotherapy    COMPLETED 12/ 2012  . Personal history of radiation therapy   . Wears glasses     Past Gynecological History:   GYNECOLOGIC HISTORY:  No LMP recorded. Patient is postmenopausal. Menarche: 59 years old P 2 LMP 59 yo Contraceptive OCP and Mirena x ~10 years HRT vaginal estrogen, no oral HRT  Last Pap 07/2017 - negative with neg HPV  Family Hx:  Family History  Problem Relation Age of Onset  . Cancer Maternal Aunt 75       colon - great maternal aunt  . Cancer Paternal Aunt 63       gyn- unsure if ovarian or uterine- met to lung  . Breast cancer Maternal Aunt 36  .  Breast cancer Mother 72  . Prostate cancer Paternal Uncle        dx >50  . Heart disease Maternal Grandmother 80  . Pneumonia Maternal Grandfather 46  . Lung cancer Paternal Grandmother 37  . Leukemia Paternal Grandfather 67  . Pancreatic cancer Paternal Aunt 46  . Bladder Cancer Paternal Aunt 79  . Lung cancer Paternal Aunt   . Other Cousin  BRCA2 +  . Breast cancer Cousin        dx <50  . Colon cancer Neg Hx     Review of Systems:  Review of Systems  Constitutional: Negative.   HENT:  Negative.   Eyes: Negative.   Respiratory: Negative.   Cardiovascular: Negative.   Gastrointestinal: Negative.   Endocrine: Negative.   Genitourinary: Negative.    Musculoskeletal: Negative.   Skin: Negative.   Neurological: Negative.   Hematological: Negative.   Psychiatric/Behavioral: Negative.   notes joint stiffness  Vitals:  Blood pressure (!) 126/97, pulse 97, temperature 97.8 F (36.6 C), temperature source Temporal, resp. rate 17, height 5' 4" (1.626 m), weight 193 lb 11.2 oz (87.9 kg), SpO2 98 %. Body mass index is 33.25 kg/m.   Physical Exam: ECOG PERFORMANCE STATUS: 0 - Asymptomatic   General :  Well developed, 59 y.o., female in no apparent distress HEENT:  Normocephalic/atraumatic, symmetric, EOMI, eyelids normal Neck:   Supple, no masses.  Lymphatics:  No cervical/ submandibular/ supraclavicular/ infraclavicular/ inguinal adenopathy Respiratory:  Respirations unlabored, no use of accessory muscles CV:   Deferred Breast:  Deferred Musculoskeletal: No CVA tenderness, normal muscle strength. Abdomen:  Soft, non-tender and nondistended. No evidence of hernia. No masses. Well healed incisions.  Extremities:  No lymphedema, no erythema, non-tender. Skin:   Normal inspection Neuro/Psych:  No focal motor deficit, no abnormal mental status. Normal gait. Normal affect. Alert and oriented to person, place, and time  Genito Urinary deferred  Thereasa Solo, MD   03/21/2019, 3:48 PM  Cc: Lurline Del MD (Referring oncologist) Dr. Marda Stalker (PCP)

## 2019-07-29 NOTE — Progress Notes (Signed)
ID: [name]   DOB: 13-Dec-1960  MR#: [id number]  AJO#:[id number]   Patient Care Team: Cordial, Claire Grill, MD as PCP - General [name], Claire Dad, MD as Consulting Physician (Hematology and Oncology) [name], MD as Consulting Physician (General Surgery) [name], MD as Consulting Physician (Radiation Oncology) Vania Rea, MD as Consulting Physician (Obstetrics and Gynecology) Claire Gibbs, NP as Nurse Practitioner (Gynecologic Oncology) Maisie Fus, MD as Consulting Physician (Obstetrics and Gynecology) OTHER MD:   CHIEF COMPLAINT:  Right Breast Cancer with BRCA2 mutation; follicular lymphoma grade 3A  CURRENT TREATMENT: Observation   INTERVAL HISTORY:  [name] returns today for follow-up and treatment of her breast cancer.    She went off anastrozole December 2020, after her bilateral salpingo-oophorectomy.  She did not notice any significant change in symptomatology.. Her most recent bone density screening on file from 07/2013 showed osteopenia.  As far as her follicular lymphoma is concerned, she reports no "B" symptoms, specifically no unexplained weight loss, no drenching sweats, no fevers, no rash, and no adenopathy.  She does have significant fatigue.  She is able to help her husband [name] with his surveying work but only about 3 to 4 hours maximum and the day after that she is very tired.  She goes to bed at 9 PM and gets up at [place], and still feels tired.  Sometimes she takes a nap after lunch.  She used to be able to keep chickens but she has not been able to do that since September 2020.  She is distressed that she is not able to work at this point.  Since her last visit, she underwent bilateral screening mammography with tomography at [facility] on 02/22/2019 showing: breast density category B; no evidence of malignancy in either breast.   She opted to proceed with bilateral salpingo-oophorectomy on 02/25/2019 under Dr. Denman Barnes. Per Dr. Serita Grit note,  the surgery was uncomplicated, but she was re-admitted the next day for altered mental status and hypersomnia. She was found to have metabolic issues with hypercarbia. Pathology from the [facility] [phone]) was benign.  She also underwent breast MRI on 03/11/2019 showing: breast composition B; no evidence of malignancy in either breast; stable left lymph nodes.   REVIEW OF SYSTEMS:  [name] has been found to have sleep apnea.  She started CPAP about 2 weeks ago.  She is tolerating it well so far.  She wonders if she is disabled as she really is not able to work at this point.  She cannot even get her house done the way she wants.  Aside from these issues a detailed review of systems today was stable   BREAST CANCER HISTORY: From the original intake note:  [name] had screening mammography 06/09/2010 at the breast center.  This suggested a possible abnormality in the right breast.  She was brought back for additional views on 06/16/2010.  Dr. Emily Barnes was able to palpate a firm mobile mass at the 9 o'clock position 7 cm from the nipple.  Compression views confirmed an ill-defined mass with punctate and linear calcifications.  Ultrasound found this to be hypoechoic and to measure 1 cm.  In addition there was a lymph node with a diffusely thickened cortex measuring 1.5 cm in the right axilla.  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  With this information the patient was set up for breast MRI on 06/25/2010.  This showed in the posterior third of the upper outer quadrant of the right breast an irregular mass measuring 1.8 cm.  There was no multifocal or multicentric disease  and there were at least two right axillary lymph nodes with thickened cortices.  There was no left axillary or left breast mass, no internal mammary chain adenopathy.  LYMPHOMA HISTORY: She underwent bilateral breast MRI on 01/29/2018, which showed breast density category B and enlarged left axillary adenopathy.  She then underwent left axillary lymph node biopsy on 03/02/2018. Cytometry [id number]) from the procedure showed monoclonal B-cell population expressing CD10 consistent with non-Hodgkin B-cell lymphoma. Pathology [phone]) report specifies a follicular lymphoma, grade 3a.   She proceeded to definitive therapy as detailed below.   PAST MEDICAL HISTORY: Past Medical History:  Diagnosis Date  . Anxiety   . Arthritis   . BRCA2 gene mutation positive in female   . De Quervain's syndrome (tenosynovitis)    Left wrist  . Depression   . Family history of BRCA2 gene positive   . Fibromyalgia   . Follicular lymphoma grade 3a [facility]) oncologist-- dr [name]   dx 03-02-2018  s/p left axilly node dissection (NHL),  no further treatment other than active survillance  . History of gout    02-21-2019  per pt has years since last episode  . History of radiation therapy    RIGHT BREAST COMPLETED 12/ 2012  . History of right breast cancer oncologist--  dr [name]--  no recurrence   dx 04/ 2012--  IDC, Stage IIA, Grade 3, 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);  07-08-2010 s/p breast lumpecotmy w/ node dissection;  completed chemoradiation 12/ 2012  . History of TMJ syndrome   . Hyperlipidemia   . Hypothyroidism   . 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 Osteopenia 08/09/2011  . 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 Personal history of chemotherapy    COMPLETED 12/ 2012  . Personal history of radiation therapy   . Wears glasses     PAST SURGICAL HISTORY: Past Surgical History:  Procedure Laterality Date  . BREAST EXCISIONAL BIOPSY Left 2019  .  BREAST LUMPECTOMY Right 2012  . BREAST LUMPECTOMY WITH AXILLARY LYMPH NODE BIOPSY Right 07/08/2010   W/  PAC INSERTION  . MANDIBLE SURGERY  1991   due to accident as a child. sx to straighten dental bite.  Marland Kitchen PORT-A-CATH REMOVAL  12/28/2010  . RADIOACTIVE SEED GUIDED AXILLARY SENTINEL LYMPH NODE Left 03/02/2018   Procedure: SEED TARGETED DEEP LEFT AXILLARY LYMPH NODE BIOPSY;  Surgeon: [name], MD;  Location: [facility];  Service: General;  Laterality: Left;  . ROBOTIC ASSISTED SALPINGO OOPHERECTOMY Bilateral 02/25/2019   Procedure: XI ROBOTIC ASSISTED SALPINGO OOPHORECTOMY;  Surgeon: Everitt Amber, MD;  Location: [facility];  Service: Gynecology;  Laterality: Bilateral;    FAMILY HISTORY Family History  Problem Relation Age of Onset  . Cancer Maternal Aunt 75       colon - great maternal aunt  . Cancer Paternal Aunt 7       gyn- unsure if ovarian or uterine- met to lung  . Breast cancer Maternal Aunt 71  . Breast cancer Mother 29  . Prostate cancer  Paternal Uncle        dx >50  . Heart disease Maternal Grandmother 80  . Pneumonia Maternal Grandfather 49  . Lung cancer Paternal Grandmother 51  . Leukemia Paternal Grandfather 61  . Pancreatic cancer Paternal Aunt 27  . Bladder Cancer Paternal Aunt 79  . Lung cancer Paternal Aunt   . Other Cousin        BRCA2 +  . Breast cancer Cousin        dx <50  . Colon cancer Neg Hx   The patient's father died 05/07/2015 apparently following multiple strokes. Patient's mother is passed away 2015/09/04, two weeks after being diagnosed with breast cancer, but she notes that her mother knew about a lump in her breast for 2 years and did not bring this to medical attention. She has no sisters, and had one brother, who is now deceased. She has a maternal aunt who was diagnosed with breast cancer before the age of 29 and a paternal aunt who was diagnosed with ovarian cancer before the age of 53.   GYNECOLOGIC HISTORY: Menarche age 44,  menopause about five to seven years ago.  She has been on hormone replacement for various periods most recently started estradiol June 10, 2010.  She never had any complications from any of the hormones she took and she has a Mirena IUD in place. She underwent BSO in 02/2019.   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  The [employer] brings them the hens, they gather the eggs and then [employer] sets the eggs up to hatch.  As of October 2020 she is "retiring" from the [employer].  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 They have two daughters "[name]" Analyssa Downs, 30, who works in [employer], but lives at home and [name] "Katie" Double Barnes, 25, who has graduated from [employer].  Joellen Jersey is my patient, with a positive BRCA2 mutation.  [facility] notes that her mother passed away in Sep 03, 2016, two weeks after being diagnosed with breast cancer.    ADVANCED DIRECTIVES: In the absence of any documentation to the contrary, the patient's spouse is their HCPOA.    HEALTH MAINTENANCE: Social History   Tobacco Use  . Smoking status: Never Smoker  . Smokeless tobacco: Never Used  Substance Use Topics  . Alcohol use: Not Currently  . Drug use: Never     Colonoscopy: Never  PAP:  Bone density: 07/2013, -1.8  No Known Allergies  Current Outpatient Medications  Medication Sig Dispense Refill  . citalopram (CELEXA) 40 MG tablet Take 1 tablet (40 mg total) by mouth daily. 90 tablet 4  . fluticasone (FLONASE) 50 MCG/ACT nasal spray Place 1 spray into both nostrils at bedtime.     Marland Kitchen ibuprofen (ADVIL) 800 MG tablet Take 1 tablet (800 mg total) by mouth every 8 (eight) hours as needed for moderate pain. For AFTER surgery only (Patient not taking: Reported on 03/21/2019) 30 tablet 0  . levothyroxine (SYNTHROID, LEVOTHROID) 88 MCG tablet Take 88 mcg by mouth daily before breakfast.     .  meloxicam (MOBIC) 7.5 MG tablet Take 2 tablets (15 mg total) by mouth daily. 60 tablet 6  . senna-docusate (SENOKOT-S) 8.6-50 MG tablet Take 2 tablets by mouth at bedtime. For AFTER surgery, do not take if having diarrhea 30 tablet 0   No current facility-administered medications for  this visit.    OBJECTIVE: white Barnes in no acute distress Vitals:   07/30/19 0919  BP: 136/87  Pulse: 74  Resp: 18  Temp: 98 F (36.7 C)  SpO2: 96%     Body mass index is 33.99 kg/m.    ECOG FS: 1  Filed Weights   07/30/19 0919  Weight: 198 lb (89.8 kg)    Sclerae unicteric, EOMs intact Wearing a mask No cervical or supraclavicular adenopathy Lungs no rales or rhonchi Heart regular rate and rhythm Abd soft, nontender, positive bowel sounds MSK no focal spinal tenderness, no upper extremity lymphedema Neuro: nonfocal, well oriented, appropriate affect Breasts: No palpable masses, no evidence of local recurrence, both axillae are benign   LAB RESULTS: Lab Results  Component Value Date   WBC 6.0 07/30/2019   NEUTROABS 3.3 07/30/2019   HGB 14.1 07/30/2019   HCT 41.6 07/30/2019   MCV 89.5 07/30/2019   PLT 169 07/30/2019      Chemistry      Component Value Date/Time   NA 139 07/30/2019 0904   NA 142 08/05/2014 0818   K 3.9 07/30/2019 0904   K 3.8 08/05/2014 0818   CL 104 07/30/2019 0904   CL 105 07/09/2012 0808   CO2 23 07/30/2019 0904   CO2 23 08/05/2014 0818   BUN 13 07/30/2019 0904   BUN 10.7 08/05/2014 0818   CREATININE 0.82 07/30/2019 0904   CREATININE 0.77 03/15/2018 1332   CREATININE 0.8 08/05/2014 0818      Component Value Date/Time   CALCIUM 9.0 07/30/2019 0904   CALCIUM 8.5 08/05/2014 0818   ALKPHOS 82 07/30/2019 0904   ALKPHOS 62 08/05/2014 0818   AST 16 07/30/2019 0904   AST 22 03/15/2018 1332   AST 22 08/05/2014 0818   ALT 15 07/30/2019 0904   ALT 23 03/15/2018 1332   ALT 20 08/05/2014 0818   BILITOT 0.7 07/30/2019 0904   BILITOT 0.5 03/15/2018 1332    BILITOT 0.59 08/05/2014 0818     Beta-2 microglobulin is normal.  LDH is minimally elevated, and no more than 4 months ago  STUDIES: No results found.   ASSESSMENT: 59 y.o. BRCA2 positive [name]   (1)  status post right lumpectomy and axillary lymph node dissection April 2012 for a T1c N1 M0, stage IIA invasive ductal carcinoma, grade 3, strongly estrogen and progesterone receptor positive, HER-2 negative with an MIB-1 of 26%,   (2)  treated adjuvantly with 4 cycles of dose dense doxorubicin and cyclophosphamide followed by 9 weekly doses of paclitaxel   (3)  radiation completed December of 2012.   (4)  She started letrozole 03/15/2011, switched to tamoxifen as of January 2016  (a) completed 5 years of antiestrogens February 2018  (b) anastrozole started April 2019  (5) Osteopenia, started alendronate (Fosamax) May 2013  (6) genetics testing 05/11/2017 through the Common Hereditary Cancers Panel + Myelodysplastic syndrome/Leukemia Panel was ordered from [facility] found a Positive: Pathogeic variant in BRCA2 c.[id number] (p.Leu2092Profs*7).   (a) there were no pathogenic mutations noted in APC, ATM, AXIN2, BARD1, BLM, BMPR1A, BRCA1, BRIP1, CDH1, CDK4, CDKN2A (p14ARF), CDKN2A (p16INK4a), CEBPA, CHEK2, CTNNA1, DICER1, EPCAM*, GATA2, GREM1*, HRAS, KIT, MEN1, MLH1, MSH2, MSH3, MSH6, MUTYH, NBN, NF1, PALB2, PDGFRA, PMS2, POLD1, POLE, PTEN, RAD50, RAD51C, RAD51D, RUNX1, SDHB, SDHC, SDHD, SMAD4, SMARCA4, STK11, TERC, TERT, TP53, TSC1, TSC2, VHL. The following genes were evaluated for sequence changes only:HOXB13*, NTHL1*, SDHA.  (b) A variant of uncertain significance in the gene MSH3 was also  identified c.2695A>G (p.Met899Val).   (7) intensified screening:   (a) mammography every April  (b) breast MRI every October   (c) bi-annual breast exam  (8) breast cancer risk reduction: antiestrogens started January 2013, completed 8 years December 2020  (a) bone density agrees per imaging  07/31/2013 showed a T score -2.2  (9) ovarian cancer risk reduction: s/p BSO 02/25/2019 with benign pathology  (10) follicular B cell non-Hodgkin's lymphoma grade 3A diagnosed 03/02/2018 through left axillary lymph node biopsy and flow cytometry  (a) the cells are CD10, CD19, CD23, and HLA-DR positive, kappa restricted.  CD5 negative  (b) CT scans of the chest abdomen and pelvis 02/21/2018 shows measurable adenopathy both above and below the diaphragm (largest lesions 1.5 cm left axilla, 1.3 cm left inguinal area).  (c) FLIPI2 at diagnosis: <60 y/o, Hb >12, largest node <6.0 cm; beta-2-microglobulin 1.8 (WNL); bone marrow not obtained  (d) HIV and hepatitis B studies 03/15/2018 negative  (11) DEXA scan 07/31/2013 showed a T score of -1.8   PLAN:  Malyn is a little over 9 years out from definitive surgery for her breast cancer with no evidence of disease recurrence.  This is very favorable.  She did well with her bilateral salpingo-oophorectomy except for the episode of hypercarbia.  She has subsequently been diagnosed with sleep apnea and is now learning how to use CPAP.  So far she is tolerating it well.  She is on thyroid medication and her primary care physician will be following on thyroid.  We discussed the issue of fatigue extensively.  If it is due to sleep apnea that is being taken care of.  If it is hypothyroidism that is being followed.  She does think she is somewhat depressed.  We could try adding Wellbutrin just 150 mg a day to her Celexa and see if that makes a difference.  I think if she gets down to a BMI of less than 30 she would also have more energy.  She is already starting a low-carb diet and I strongly encouraged her to continue that.  We also talked about her counting steps and making sure she gets at least 5000 steps a day with a goal being 10,000.  I reassured her that her lymphoma is of no consequence right now and does not require any intervention.  She will have  mammography later this month and I am putting her in for an MRI of the breast late this year, but she will make sure that she checks on out-of-pocket cost since she has not met her deductible at least so far at this point.  She will then see me again a year from now.  She knows to call for any other issue that may develop before then.  Total encounter time 35 minutes.*  Mickell Birdwell, Claire Dad, MD  07/30/19 9:51 AM Medical Oncology and Hematology [facility] [address] [place], [place] [address] Tel. [phone]    Fax. [phone]    I, Wilburn Mylar am acting as scribe for Dr. Virgie Barnes. Margreat Widener.  I, Lurline Del MD, have reviewed the above documentation for accuracy and completeness, and I agree with the above.   *Total Encounter Time as defined by the Centers for Medicare and Medicaid Services includes, in addition to the face-to-face time of a patient visit (documented in the note above) non-face-to-face time: obtaining and reviewing outside history, ordering and reviewing medications, tests or procedures, care coordination (communications with other health care professionals or caregivers) and  documentation in the medical record.

## 2019-07-30 ENCOUNTER — Other Ambulatory Visit: Payer: Self-pay

## 2019-07-30 ENCOUNTER — Inpatient Hospital Stay: Payer: BC Managed Care – PPO | Attending: Oncology | Admitting: Oncology

## 2019-07-30 ENCOUNTER — Inpatient Hospital Stay: Payer: BC Managed Care – PPO

## 2019-07-30 VITALS — BP 136/87 | HR 74 | Temp 98.0°F | Resp 18 | Ht 64.0 in | Wt 198.0 lb

## 2019-07-30 DIAGNOSIS — C50911 Malignant neoplasm of unspecified site of right female breast: Secondary | ICD-10-CM

## 2019-07-30 DIAGNOSIS — G4730 Sleep apnea, unspecified: Secondary | ICD-10-CM | POA: Insufficient documentation

## 2019-07-30 DIAGNOSIS — C8298 Follicular lymphoma, unspecified, lymph nodes of multiple sites: Secondary | ICD-10-CM

## 2019-07-30 DIAGNOSIS — C8234 Follicular lymphoma grade IIIa, lymph nodes of axilla and upper limb: Secondary | ICD-10-CM

## 2019-07-30 DIAGNOSIS — C50411 Malignant neoplasm of upper-outer quadrant of right female breast: Secondary | ICD-10-CM | POA: Diagnosis not present

## 2019-07-30 DIAGNOSIS — Z170 Estrogen receptor positive status [ER+]: Secondary | ICD-10-CM

## 2019-07-30 DIAGNOSIS — R5383 Other fatigue: Secondary | ICD-10-CM | POA: Diagnosis not present

## 2019-07-30 DIAGNOSIS — M8580 Other specified disorders of bone density and structure, unspecified site: Secondary | ICD-10-CM | POA: Diagnosis not present

## 2019-07-30 DIAGNOSIS — Z853 Personal history of malignant neoplasm of breast: Secondary | ICD-10-CM | POA: Diagnosis not present

## 2019-07-30 DIAGNOSIS — Z9221 Personal history of antineoplastic chemotherapy: Secondary | ICD-10-CM | POA: Diagnosis not present

## 2019-07-30 DIAGNOSIS — Z923 Personal history of irradiation: Secondary | ICD-10-CM | POA: Insufficient documentation

## 2019-07-30 DIAGNOSIS — Z1501 Genetic susceptibility to malignant neoplasm of breast: Secondary | ICD-10-CM

## 2019-07-30 LAB — CBC WITH DIFFERENTIAL/PLATELET
Abs Immature Granulocytes: 0.02 10*3/uL (ref 0.00–0.07)
Basophils Absolute: 0.1 10*3/uL (ref 0.0–0.1)
Basophils Relative: 1 %
Eosinophils Absolute: 0.4 10*3/uL (ref 0.0–0.5)
Eosinophils Relative: 7 %
HCT: 41.6 % (ref 36.0–46.0)
Hemoglobin: 14.1 g/dL (ref 12.0–15.0)
Immature Granulocytes: 0 %
Lymphocytes Relative: 26 %
Lymphs Abs: 1.5 10*3/uL (ref 0.7–4.0)
MCH: 30.3 pg (ref 26.0–34.0)
MCHC: 33.9 g/dL (ref 30.0–36.0)
MCV: 89.5 fL (ref 80.0–100.0)
Monocytes Absolute: 0.6 10*3/uL (ref 0.1–1.0)
Monocytes Relative: 10 %
Neutro Abs: 3.3 10*3/uL (ref 1.7–7.7)
Neutrophils Relative %: 56 %
Platelets: 169 10*3/uL (ref 150–400)
RBC: 4.65 MIL/uL (ref 3.87–5.11)
RDW: 13.1 % (ref 11.5–15.5)
WBC: 6 10*3/uL (ref 4.0–10.5)
nRBC: 0 % (ref 0.0–0.2)

## 2019-07-30 LAB — COMPREHENSIVE METABOLIC PANEL
ALT: 15 U/L (ref 0–44)
AST: 16 U/L (ref 15–41)
Albumin: 3.7 g/dL (ref 3.5–5.0)
Alkaline Phosphatase: 82 U/L (ref 38–126)
Anion gap: 12 (ref 5–15)
BUN: 13 mg/dL (ref 6–20)
CO2: 23 mmol/L (ref 22–32)
Calcium: 9 mg/dL (ref 8.9–10.3)
Chloride: 104 mmol/L (ref 98–111)
Creatinine, Ser: 0.82 mg/dL (ref 0.44–1.00)
GFR calc Af Amer: >60 mL/min (ref >60)
GFR calc non Af Amer: >60 mL/min (ref >60)
Glucose, Bld: 94 mg/dL (ref 70–99)
Potassium: 3.9 mmol/L (ref 3.5–5.1)
Sodium: 139 mmol/L (ref 135–145)
Total Bilirubin: 0.7 mg/dL (ref 0.3–1.2)
Total Protein: 6.7 g/dL (ref 6.5–8.1)

## 2019-07-30 LAB — LACTATE DEHYDROGENASE: LDH: 163 U/L (ref 98–192)

## 2019-07-30 MED ORDER — BUPROPION HCL ER (XL) 150 MG PO TB24: 150.0000 mg | ORAL_TABLET | Freq: Every day | ORAL | 6 refills | Status: DC

## 2019-07-31 ENCOUNTER — Telehealth: Payer: Self-pay | Admitting: Oncology

## 2019-07-31 LAB — IGG, IGA, IGM
IgA: 146 mg/dL (ref 87–352)
IgG (Immunoglobin G), Serum: 964 mg/dL (ref 586–1602)
IgM (Immunoglobulin M), Srm: 112 mg/dL (ref 26–217)

## 2019-07-31 LAB — KAPPA/LAMBDA LIGHT CHAINS
Kappa free light chain: 21.1 mg/L — ABNORMAL HIGH (ref 3.3–19.4)
Kappa, lambda light chain ratio: 2.05 — ABNORMAL HIGH (ref 0.26–1.65)
Lambda free light chains: 10.3 mg/L (ref 5.7–26.3)

## 2019-07-31 LAB — BETA 2 MICROGLOBULIN, SERUM: Beta-2 Microglobulin: 1.4 mg/L (ref 0.6–2.4)

## 2019-07-31 NOTE — Telephone Encounter (Signed)
Scheduled appts per 5/18 los. Left voicemail with appt date and time.

## 2019-09-20 ENCOUNTER — Other Ambulatory Visit: Payer: Self-pay | Admitting: Oncology

## 2020-06-14 IMAGING — US US AXILLARY NODE CORE BIOPSY LEFT
1 series · 13 of 14 positions shown · non-contrast
Comparison: Previous exam(s).

Addendum:
CLINICAL DATA: Biopsy of left axillary lymph nodes

EXAM:
US AXILLARY NODE CORE BIOPSY LEFT

[Series 1: us axillary node core biopsy left · 0.07mm/px · 13 of 14 slices shown]
[im 1/14]
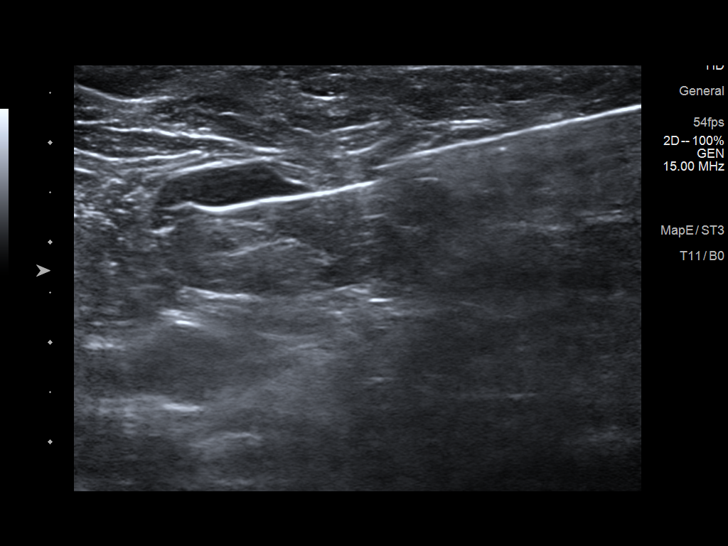
[im 2/14]
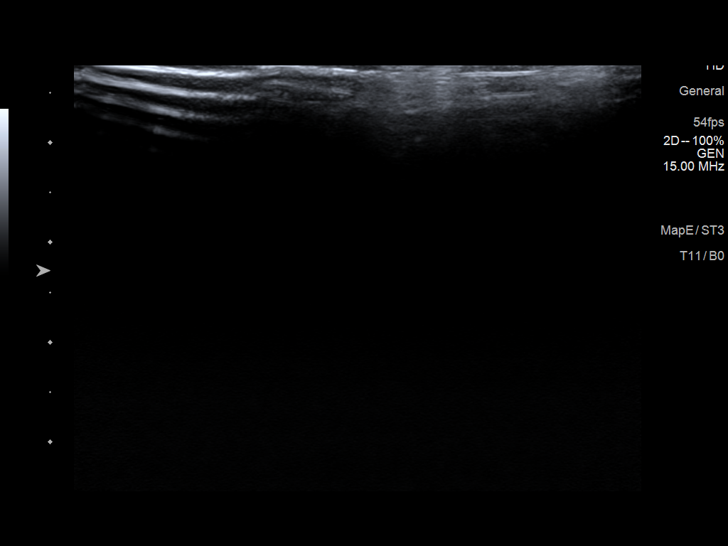
[im 3/14]
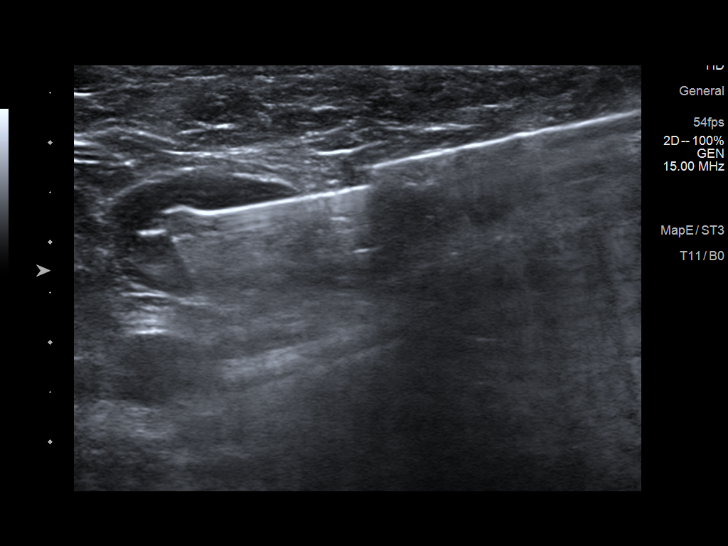
[im 4/14]
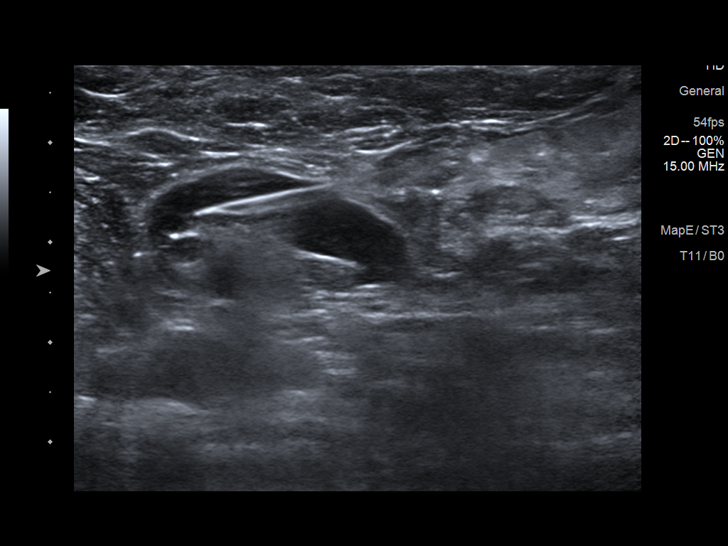
[im 5/14]
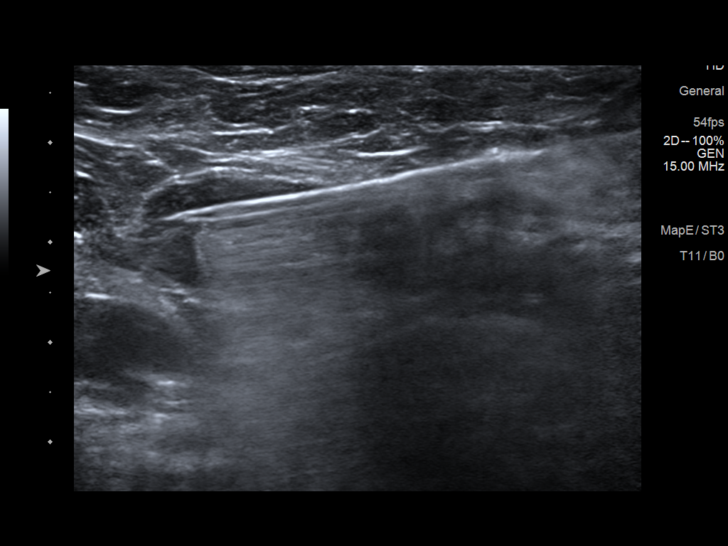
[im 6/14]
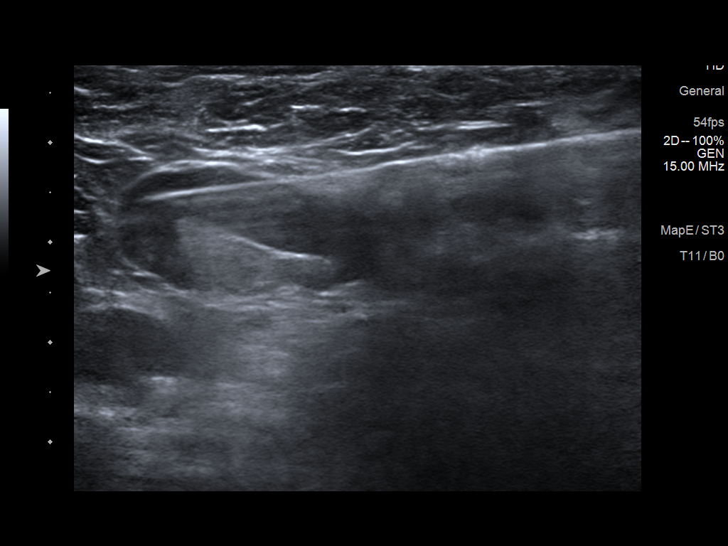
[im 8/14]
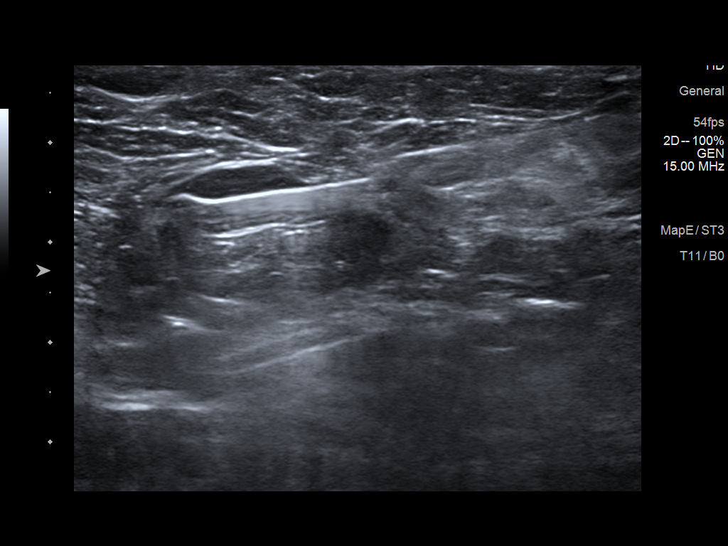
[im 9/14]
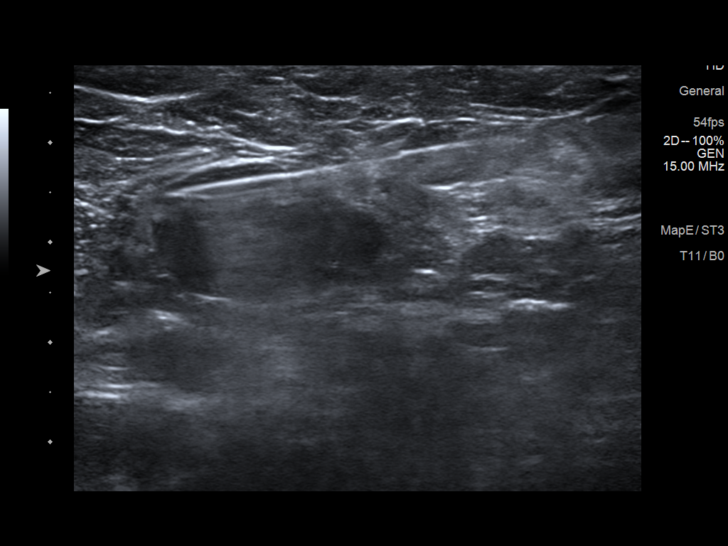
[im 10/14]
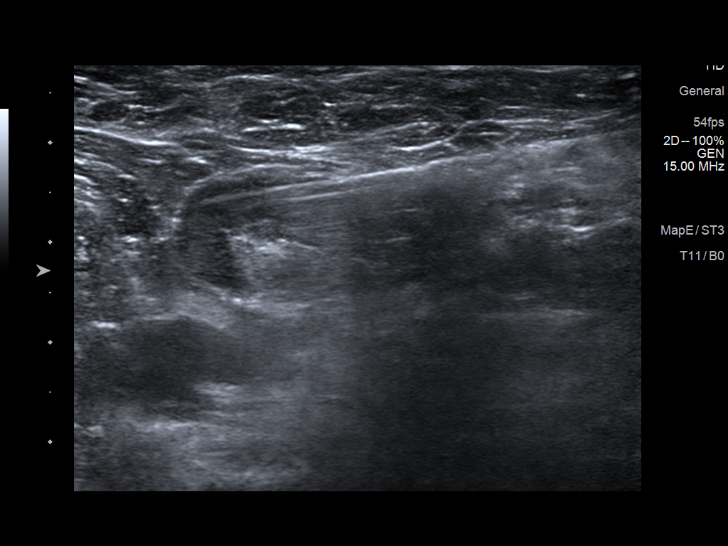
[im 11/14]
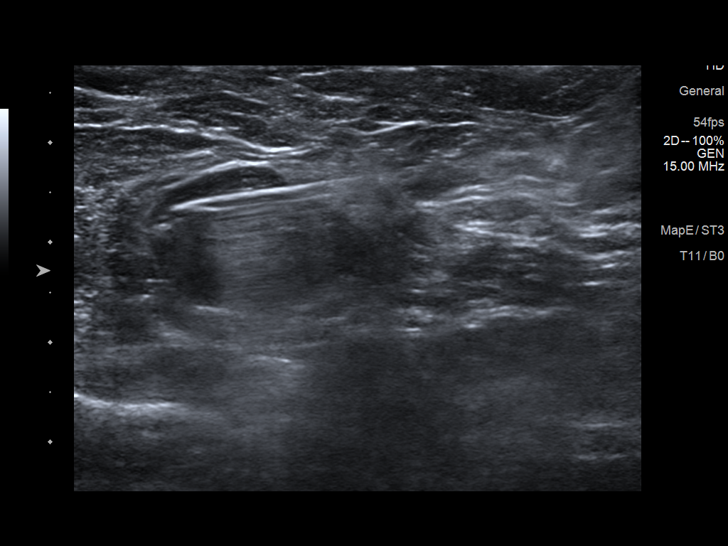
[im 12/14]
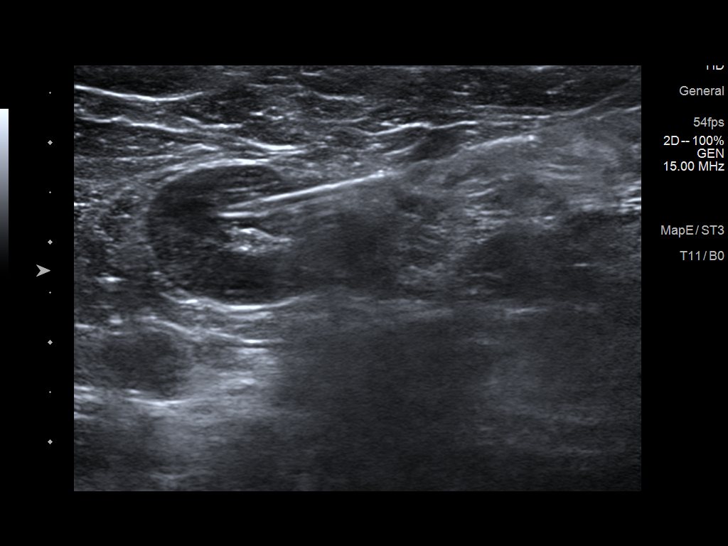
[im 13/14]
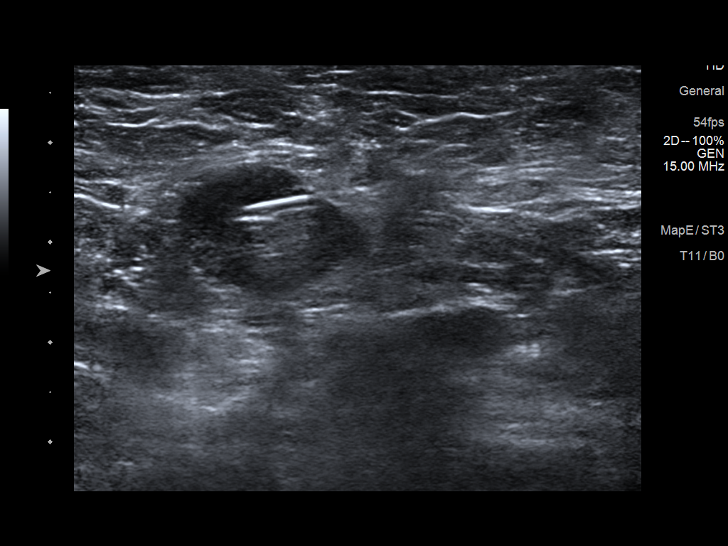
[im 14/14]
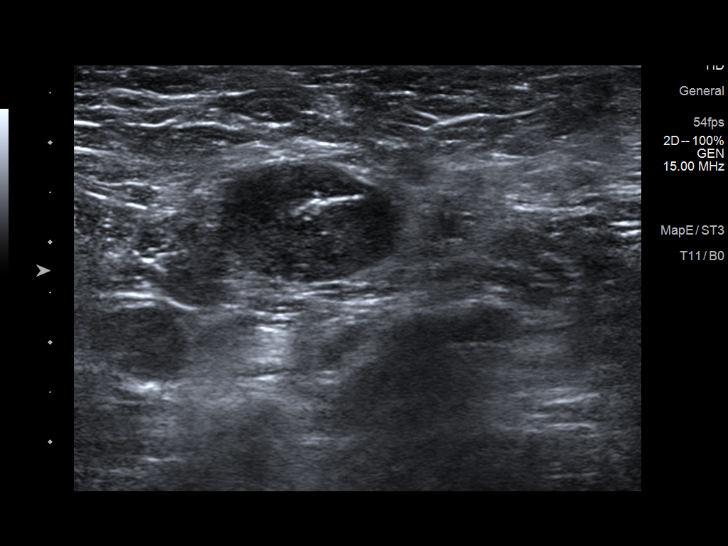

[13 of 14 positions shown; findings below may reference images not displayed]



Using sterile technique and 1% Lidocaine as local anesthetic, under
direct ultrasound visualization, a 14 gauge Irwin device was
used to perform biopsy of an abnormal left axillary lymph node using
a lateral approach. At the conclusion of the procedure a HydroMARK
tissue marker clip was deployed into the biopsy cavity. A total of 8
samples were obtained. Four of the samples were stored in saline and
the other 4 were stored in formalin.
IMPRESSION: Ultrasound guided biopsy of an abnormal left axillary lymph node. No
apparent complications.

ADDENDUM:
Pathology revealed ATYPICAL LYMPHOID PROLIFERATION of the Left
axillary lymph node. Tissue-Flow Cytometry revealed PREDOMINANCE OF
B CELLS WITH KAPPA LIGHT CHAIN EXCESS. This was found to be
concordant by Dr. Amsat Cullmann, with excisional biopsy strongly
recommended for further evaluation, per Dr. Lusjen Macja,
pathologist.

Pathology results were discussed with the patient by telephone. The
patient reported doing well after the biopsy with tenderness at the
site. Post biopsy instructions and care were reviewed and questions
were answered. The patient was encouraged to call The [REDACTED]

I contacted Dr. Nya Jumper and Dr. Yael Rosete with biopsy
results on February 07, 2018. Dr. Kenichiro and Dr. Maca are
currently devising a treatment plan for the patient.

Pathology results reported by Jabriil Senja, RN on 02/12/2018.

*** End of Addendum ***

## 2020-06-14 IMAGING — US US AXILLARY LEFT
1 series · 3 of 3 positions shown · non-contrast
Comparison: MRI January 29, 2018

CLINICAL DATA: Abnormal left axillary nodes seen on the left on the
recent MRI. History of right breast cancer. BRCA 2 positive.

EXAM:
ULTRASOUND OF THE LEFT AXILLA

[Series 1: us axillary left · 0.07mm/px · 3 of 3 slices shown]
[im 1/3]
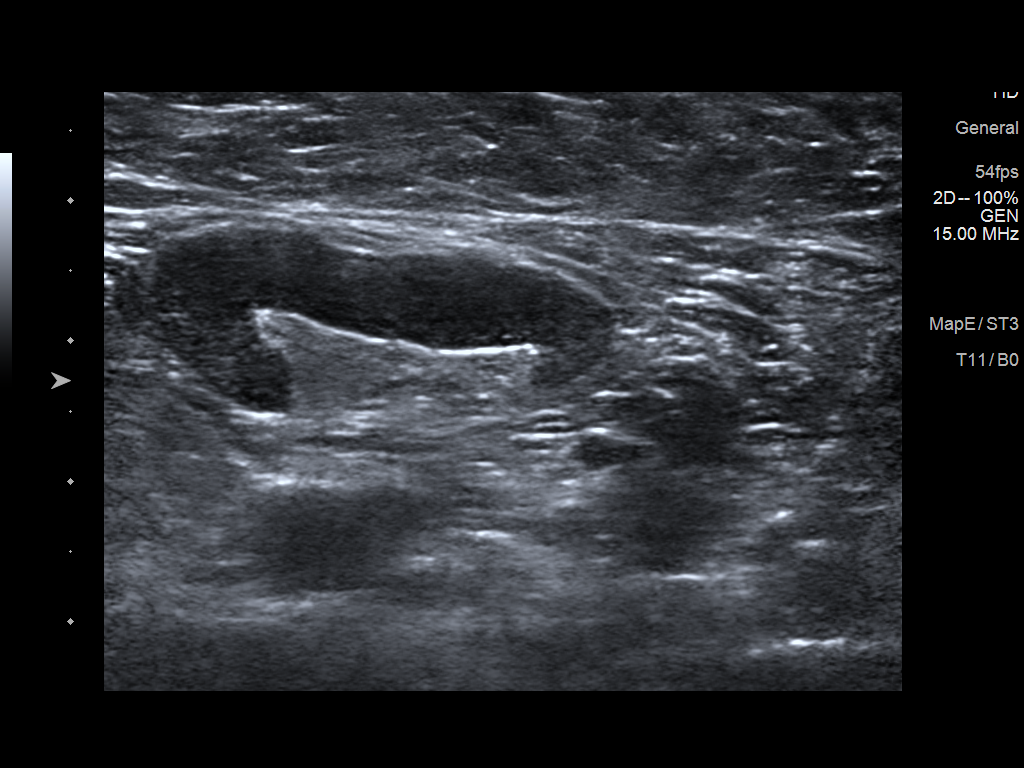
[im 2/3]
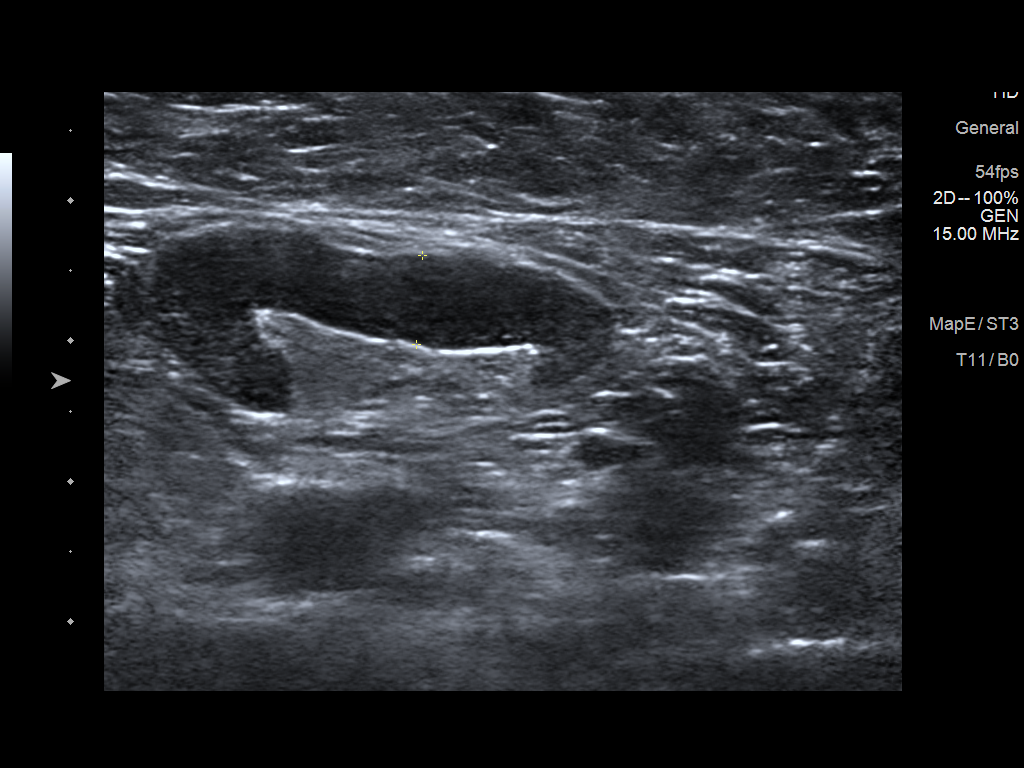
[im 3/3]
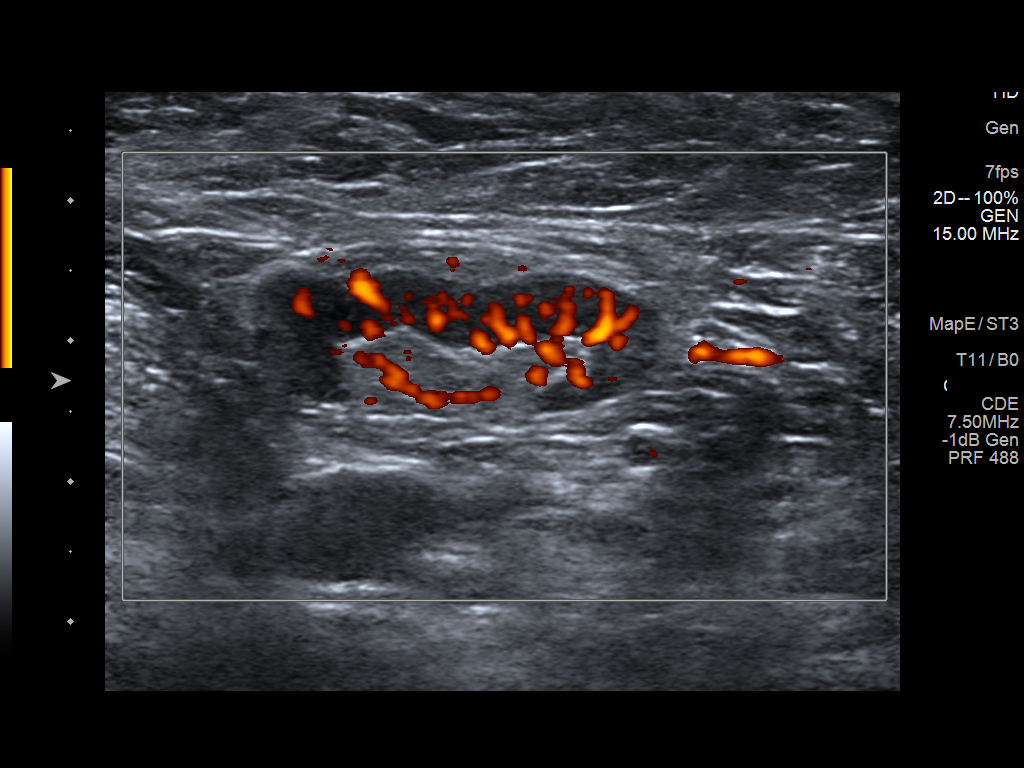

[3 of 3 positions shown; findings below may reference images not displayed]

FINDINGS: On physical exam,a firm palpable lump is felt in the left axilla.

Ultrasound is performed, showing at least 4 abnormal left axillary
lymph nodes.
IMPRESSION: At least 4 abnormal left axillary lymph nodes are identified.

RECOMMENDATION:
Recommend ultrasound-guided biopsy of 1 of the abnormal left
axillary lymph nodes.

I have discussed the findings and recommendations with the patient.
Results were also provided in writing at the conclusion of the
visit. If applicable, a reminder letter will be sent to the patient
regarding the next appointment.

BI-RADS CATEGORY  4: Suspicious.

## 2020-06-29 ENCOUNTER — Other Ambulatory Visit: Payer: Self-pay | Admitting: Oncology

## 2020-06-30 IMAGING — CT CT ABD-PELV W/ CM
3 of 5 series · 14 of 36 positions shown, 17 images · IV contrast (omnipaque)
Comparison: None.

CLINICAL DATA: Diffuse large B-cell lymphoma with left axillary
lymphadenopathy. History of right breast cancer, status post right
lumpectomy, chemotherapy and XRT complete.

EXAM:
CT CHEST, ABDOMEN, AND PELVIS WITH CONTRAST
TECHNIQUE: Multidetector CT imaging of the chest, abdomen and pelvis was
performed following the standard protocol during bolus
administration of intravenous contrast.
CONTRAST:  100mL OMNIPAQUE IOHEXOL 300 MG/ML  SOLN

[Series 2: cap with · axial · 0.81mm/px · z∈[-570,-85]mm · 9 of 123 slices shown, 12 images]
[im 13/123  mediastinal]
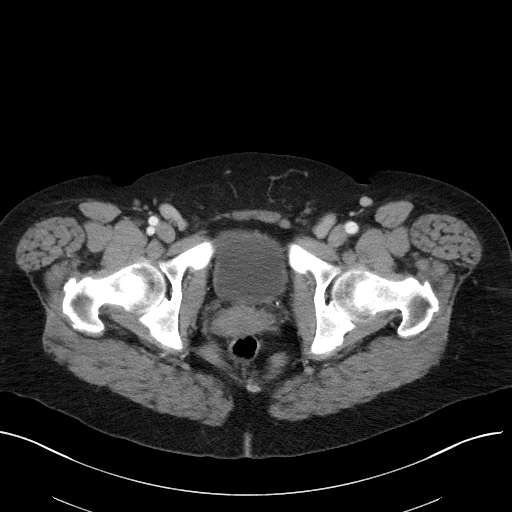
[im 13/123  lung]
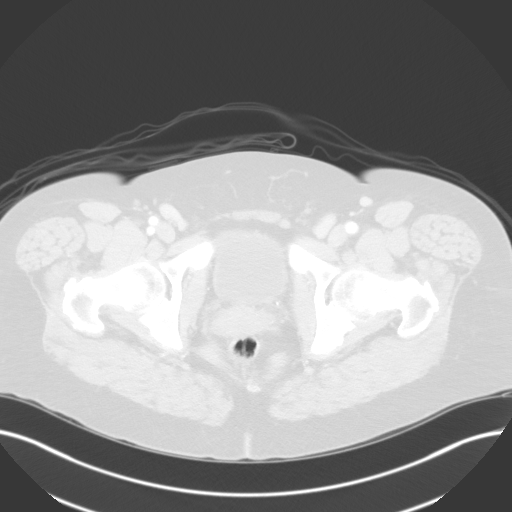
[im 25/123  lung]
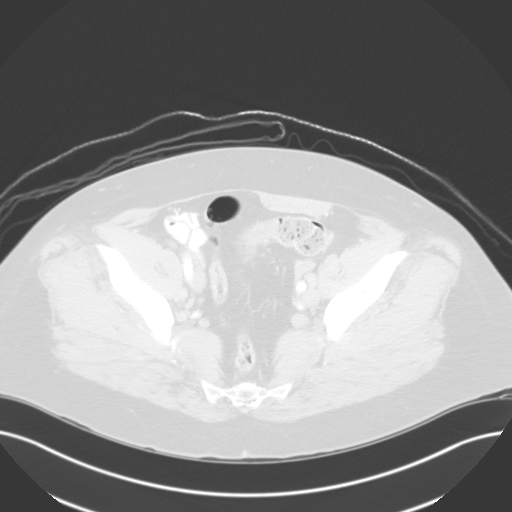
[im 37/123  lung]
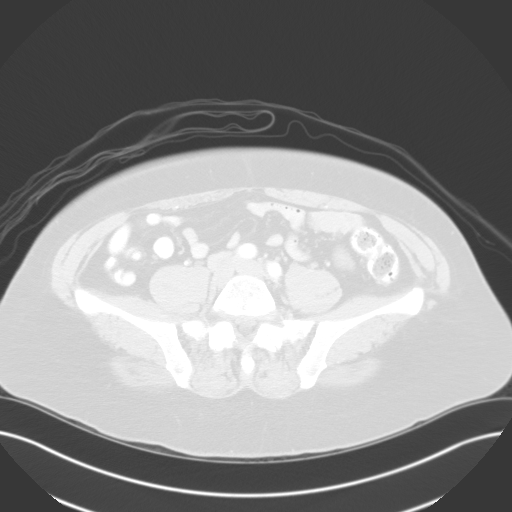
[im 49/123  lung]
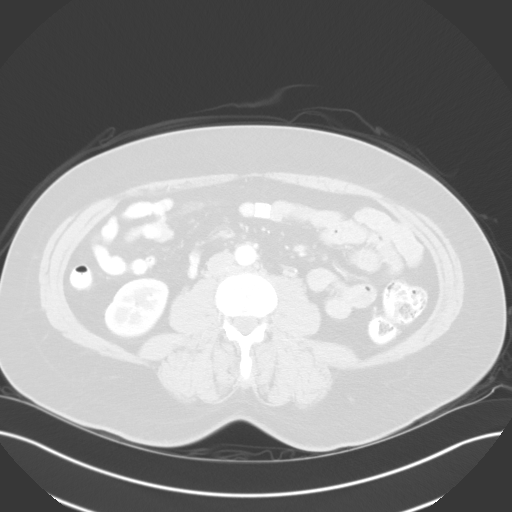
[im 62/123  mediastinal]
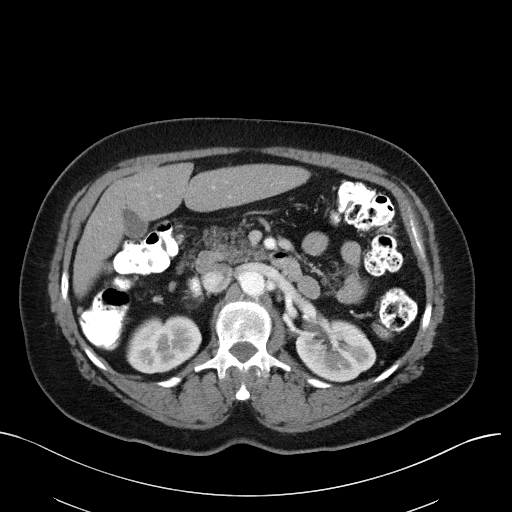
[im 62/123  lung]
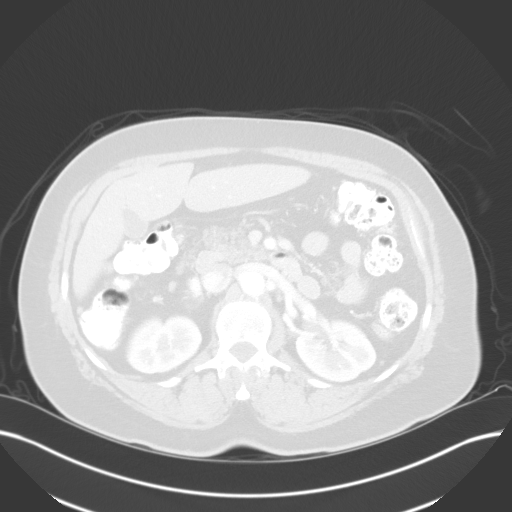
[im 74/123  lung]
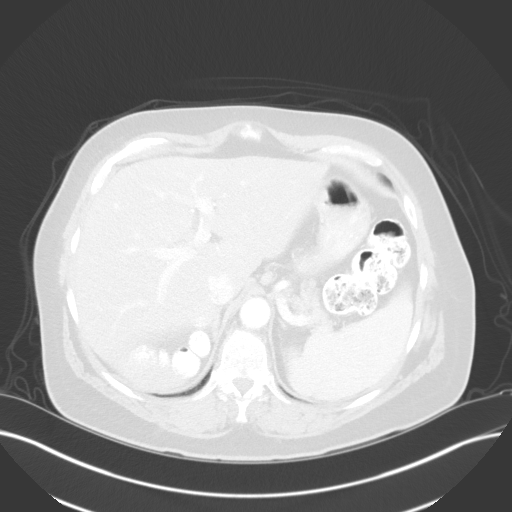
[im 86/123  lung]
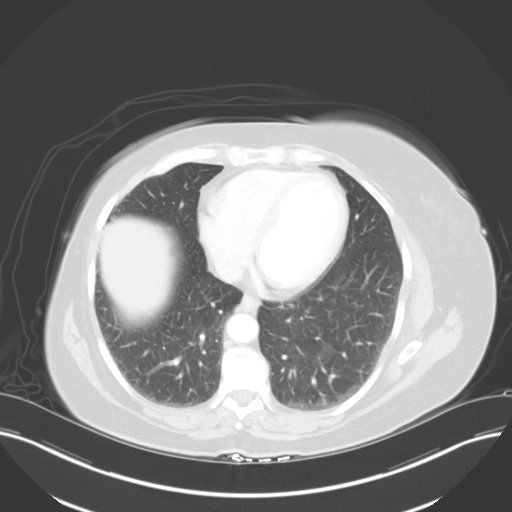
[im 98/123  lung]
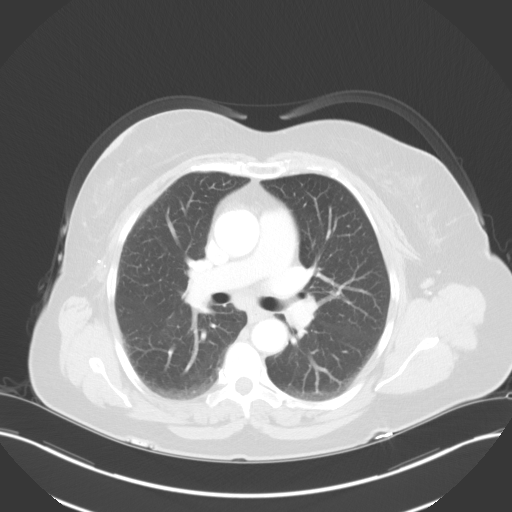
[im 110/123  mediastinal]
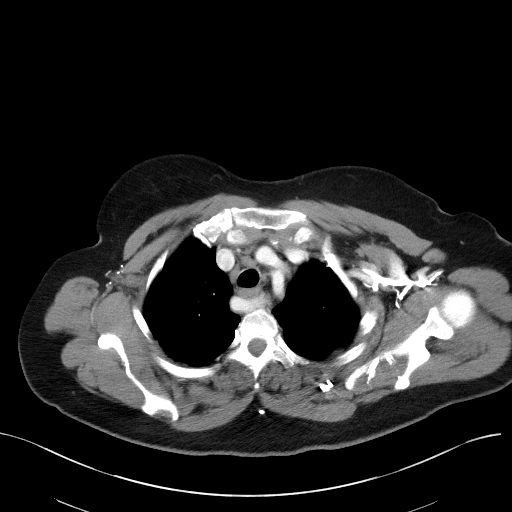
[im 110/123  lung]
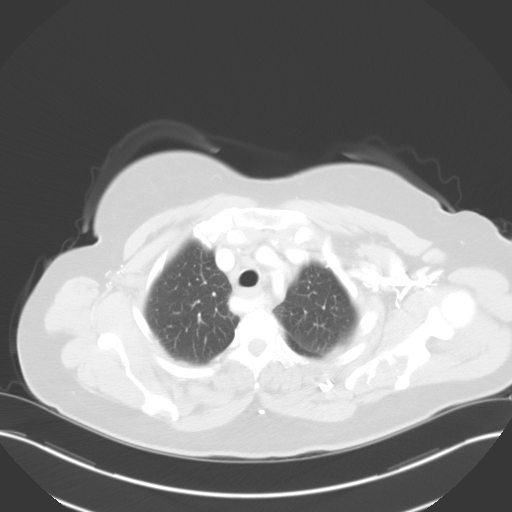

[Series 5: coronals · coronal · 0.90mm/px · 3 of 146 slices shown]
[im 30/146  lung]
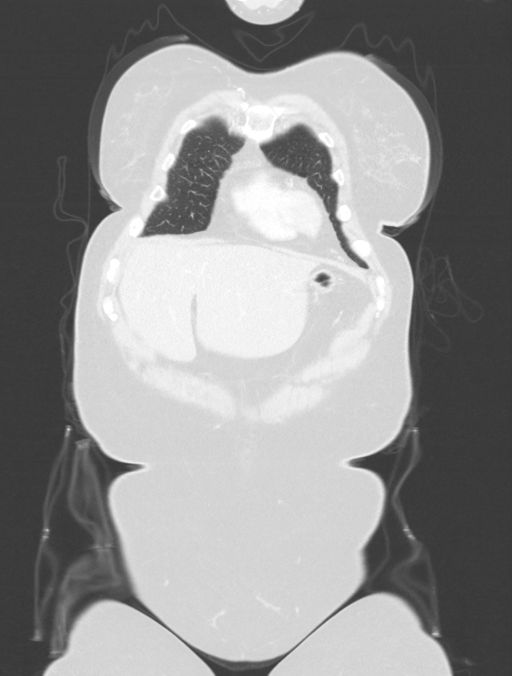
[im 59/146  lung]
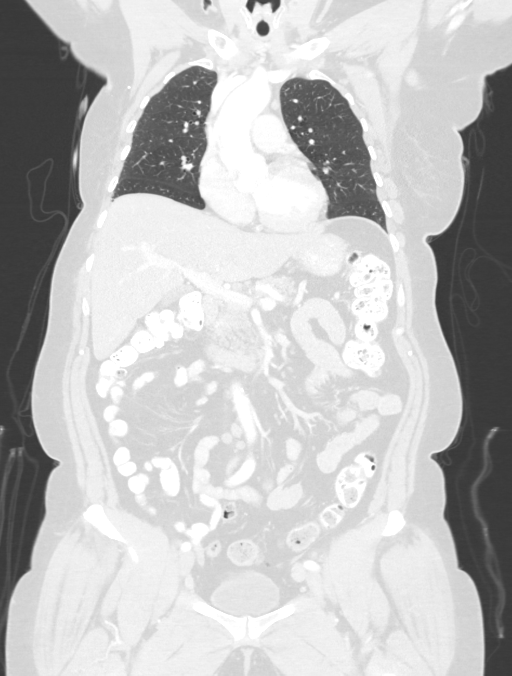
[im 88/146  lung]
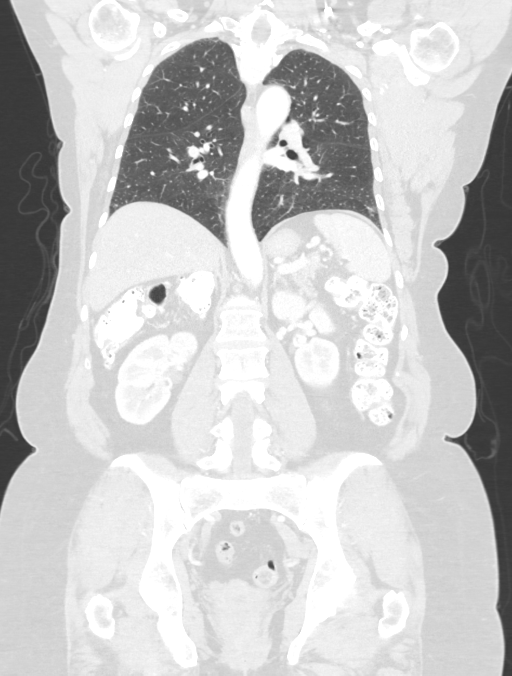

[Series 7: lung · axial · 0.81mm/px · z∈[-282,-234]mm · 2 of 143 slices shown]
[im 12/143  lung]
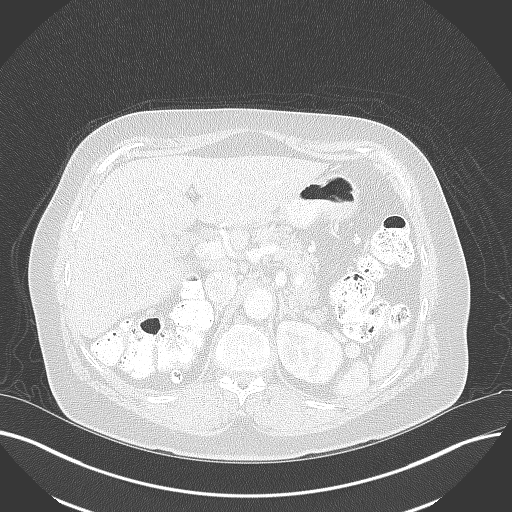
[im 36/143  lung]
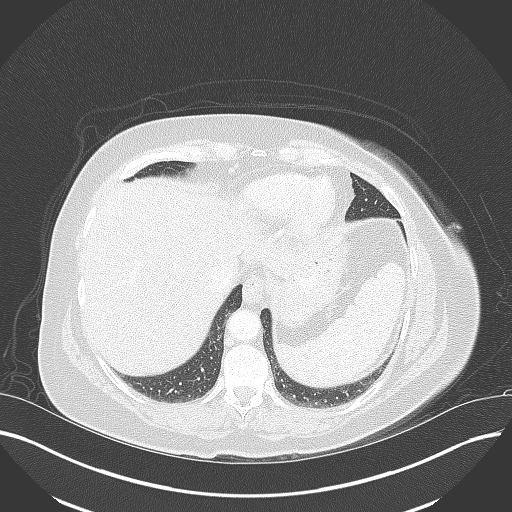

[14 of 36 positions shown; findings below may reference images not displayed]

FINDINGS: CT CHEST FINDINGS

Cardiovascular: Heart is normal in size.  No pericardial effusion.

No evidence of thoracic aortic aneurysm. Aberrant right subclavian
artery.

Mediastinum/Nodes: Small mediastinal nodes, including an 8 mm short
axis prevascular node, a 7 mm short axis low right paratracheal
node, and an 11 mm short axis subcarinal node (series 2/image 26).

No suspicious hilar lymphadenopathy.

Status post right axillary lymph node dissection. Left axillary
lymphadenopathy, measuring up to 15 mm short axis (series 2/image
21).

Small left supraclavicular nodes measuring up to 7 mm short axis
(series 2/image 7).

Visualized thyroid is unremarkable.

Lungs/Pleura: Lungs are essentially clear.

Mild dependent atelectasis in the bilateral lower lobes. No focal
consolidation.

No suspicious pulmonary nodules.

No pleural effusion or pneumothorax.

Musculoskeletal: Surgical clips in the lateral right breast (series
2/image 17), related to right breast lumpectomy.

Very mild degenerative changes the lower thoracic spine.

CT ABDOMEN PELVIS FINDINGS

Hepatobiliary: Liver is within normal limits.

Gallbladder is unremarkable. No intrahepatic or extrahepatic ductal
dilatation.

Pancreas: Within normal limits.

Spleen: Normal in size.

Adrenals/Urinary Tract: Adrenal glands are within normal limits.

Two right lower pole renal cysts measuring up to 6 mm. Left kidney
is within normal limits. No hydronephrosis.

Bladder is mildly thick-walled although underdistended.

Stomach/Bowel: Stomach is within normal limits.

No evidence of bowel obstruction.

Normal appendix (series 2/image 64).

Vascular/Lymphatic: No evidence of abdominal aortic aneurysm.

Small retroperitoneal nodes, including an 8 mm short axis left
common iliac node (series 2/image 81).

Small bilateral inguinal nodes, measuring 13 mm on the left and 12
mm on the right.

Reproductive: Uterus is within normal limits.

Bilateral ovaries are within normal limits.

Other: No abdominopelvic ascites.

Musculoskeletal: Visualized osseous structures are within normal
limits.
IMPRESSION: Left axillary lymphadenopathy, measuring up to 15 mm short axis.
Additional small thoracic, retroperitoneal, and bilateral inguinal
lymph nodes, within normal limits for size, although possibly
related to the patient's known lymphoma. Spleen is normal in size.

Status post right breast lumpectomy and right axillary lymph node
dissection. No findings specific for recurrent or metastatic
disease.

## 2020-07-03 ENCOUNTER — Telehealth: Payer: Self-pay | Admitting: Oncology

## 2020-07-03 NOTE — Telephone Encounter (Signed)
Scheduled appt per 4/19 sch msg. Pt aware.  

## 2020-07-29 ENCOUNTER — Ambulatory Visit: Payer: BC Managed Care – PPO | Admitting: Oncology

## 2020-07-29 ENCOUNTER — Other Ambulatory Visit: Payer: Self-pay

## 2020-07-29 ENCOUNTER — Other Ambulatory Visit: Payer: BC Managed Care – PPO

## 2020-07-29 ENCOUNTER — Inpatient Hospital Stay (HOSPITAL_BASED_OUTPATIENT_CLINIC_OR_DEPARTMENT_OTHER): Payer: 59 | Admitting: Oncology

## 2020-07-29 ENCOUNTER — Inpatient Hospital Stay: Payer: 59 | Attending: Oncology

## 2020-07-29 VITALS — BP 139/79 | HR 82 | Temp 97.3°F | Resp 18 | Ht 64.0 in | Wt 210.8 lb

## 2020-07-29 DIAGNOSIS — Z170 Estrogen receptor positive status [ER+]: Secondary | ICD-10-CM | POA: Diagnosis not present

## 2020-07-29 DIAGNOSIS — C8234 Follicular lymphoma grade IIIa, lymph nodes of axilla and upper limb: Secondary | ICD-10-CM

## 2020-07-29 DIAGNOSIS — M8580 Other specified disorders of bone density and structure, unspecified site: Secondary | ICD-10-CM | POA: Diagnosis not present

## 2020-07-29 DIAGNOSIS — Z1509 Genetic susceptibility to other malignant neoplasm: Secondary | ICD-10-CM | POA: Insufficient documentation

## 2020-07-29 DIAGNOSIS — Z1502 Genetic susceptibility to malignant neoplasm of ovary: Secondary | ICD-10-CM | POA: Diagnosis not present

## 2020-07-29 DIAGNOSIS — Z853 Personal history of malignant neoplasm of breast: Secondary | ICD-10-CM | POA: Insufficient documentation

## 2020-07-29 DIAGNOSIS — Z923 Personal history of irradiation: Secondary | ICD-10-CM | POA: Diagnosis not present

## 2020-07-29 DIAGNOSIS — Z90722 Acquired absence of ovaries, bilateral: Secondary | ICD-10-CM | POA: Insufficient documentation

## 2020-07-29 DIAGNOSIS — C50411 Malignant neoplasm of upper-outer quadrant of right female breast: Secondary | ICD-10-CM

## 2020-07-29 DIAGNOSIS — C50911 Malignant neoplasm of unspecified site of right female breast: Secondary | ICD-10-CM

## 2020-07-29 DIAGNOSIS — Z1501 Genetic susceptibility to malignant neoplasm of breast: Secondary | ICD-10-CM

## 2020-07-29 DIAGNOSIS — C8510 Unspecified B-cell lymphoma, unspecified site: Secondary | ICD-10-CM | POA: Diagnosis present

## 2020-07-29 LAB — COMPREHENSIVE METABOLIC PANEL
ALT: 17 U/L (ref 0–44)
AST: 24 U/L (ref 15–41)
Albumin: 4.1 g/dL (ref 3.5–5.0)
Alkaline Phosphatase: 80 U/L (ref 38–126)
Anion gap: 13 (ref 5–15)
BUN: 12 mg/dL (ref 6–20)
CO2: 23 mmol/L (ref 22–32)
Calcium: 9.3 mg/dL (ref 8.9–10.3)
Chloride: 105 mmol/L (ref 98–111)
Creatinine, Ser: 0.94 mg/dL (ref 0.44–1.00)
GFR, Estimated: >60 mL/min (ref >60)
Glucose, Bld: 128 mg/dL — ABNORMAL HIGH (ref 70–99)
Potassium: 3.5 mmol/L (ref 3.5–5.1)
Sodium: 141 mmol/L (ref 135–145)
Total Bilirubin: 0.8 mg/dL (ref 0.3–1.2)
Total Protein: 7.5 g/dL (ref 6.5–8.1)

## 2020-07-29 LAB — CBC WITH DIFFERENTIAL/PLATELET
Abs Immature Granulocytes: 0.01 10*3/uL (ref 0.00–0.07)
Basophils Absolute: 0.1 10*3/uL (ref 0.0–0.1)
Basophils Relative: 1 %
Eosinophils Absolute: 0.5 10*3/uL (ref 0.0–0.5)
Eosinophils Relative: 7 %
HCT: 42.1 % (ref 36.0–46.0)
Hemoglobin: 14.3 g/dL (ref 12.0–15.0)
Immature Granulocytes: 0 %
Lymphocytes Relative: 30 %
Lymphs Abs: 2.4 10*3/uL (ref 0.7–4.0)
MCH: 30.3 pg (ref 26.0–34.0)
MCHC: 34 g/dL (ref 30.0–36.0)
MCV: 89.2 fL (ref 80.0–100.0)
Monocytes Absolute: 0.6 10*3/uL (ref 0.1–1.0)
Monocytes Relative: 7 %
Neutro Abs: 4.5 10*3/uL (ref 1.7–7.7)
Neutrophils Relative %: 55 %
Platelets: 184 10*3/uL (ref 150–400)
RBC: 4.72 MIL/uL (ref 3.87–5.11)
RDW: 13.6 % (ref 11.5–15.5)
WBC: 8.1 10*3/uL (ref 4.0–10.5)
nRBC: 0 % (ref 0.0–0.2)

## 2020-07-29 NOTE — Progress Notes (Signed)
ID: [name]   DOB: November 02, 1960  MR#: [id number]  KVQ#:[id number]   Patient Care Team: Cordial, Launa Grill, MD as PCP - General Destyne Goodreau, Virgie Dad, MD as Consulting Physician (Hematology and Oncology) [name], MD as Consulting Physician (General Surgery) [name], MD as Consulting Physician (Radiation Oncology) Vania Rea, MD as Consulting Physician (Obstetrics and Gynecology) Dorothyann Gibbs, NP as Nurse Practitioner (Gynecologic Oncology) Maisie Fus, MD as Consulting Physician (Obstetrics and Gynecology) OTHER MD:   CHIEF COMPLAINT:  Right Breast Cancer with BRCA2 mutation; follicular lymphoma grade 3A  CURRENT TREATMENT: Observation   INTERVAL HISTORY:  [name] returns today for follow-up of her breast cancer.  She continues under observation.  She is overdue for mammography-- her most recent screening mammography was 02/22/2019 and breast MRI was 03/11/2019.  As far as her follicular lymphoma is concerned, she reports no "B" symptoms, specifically no unexplained weight loss, no drenching sweats, no fevers, no rash, and no adenopathy.   REVIEW OF SYSTEMS:  [name] continues to feel fatigued.  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  She is not getting 5000 steps most days.  Aside from that she is concerned about a smell in her armpit.  She really scrubs but she still smells that even after scrubbing.  She says no one else can smell it but it is difficult for her.  Because of her work she gets a lot of tick bites but she has not had any unusual symptoms or rashes related to that.  Aside from that a detailed review of systems today was unremarkable  BREAST CANCER HISTORY: From the original intake note:  Ms. Toulouse had screening mammography 06/09/2010 at the breast center.  This suggested a possible abnormality in the right breast.  She was brought back for additional views on 06/16/2010.  Dr. [name] was able to palpate a  firm mobile mass at the 9 o'clock position 7 cm from the nipple.  Compression views confirmed an ill-defined mass with punctate and linear calcifications.  Ultrasound found this to be hypoechoic and to measure 1 cm.  In addition there was a lymph node with a diffusely thickened cortex measuring 1.5 cm in the right axilla.  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  With this information the patient was set up for breast MRI on 06/25/2010.  This showed in the posterior third of the upper outer quadrant of the right breast an irregular mass measuring 1.8 cm.  There was no multifocal or multicentric disease and there were at least two right axillary lymph nodes with thickened cortices.  There was no left axillary or left breast mass, no internal mammary chain adenopathy.  LYMPHOMA HISTORY: She underwent bilateral breast MRI on 01/29/2018, which showed breast density category B and enlarged left axillary adenopathy.  She then underwent left axillary lymph node biopsy on 03/02/2018. Cytometry [id number]) from the procedure showed monoclonal B-cell population expressing CD10 consistent with non-Hodgkin B-cell lymphoma. Pathology [id number]) report specifies a follicular lymphoma, grade 3a.   Has not required treatment.   PAST MEDICAL HISTORY: Past Medical History:  Diagnosis Date  . Anxiety   .  Arthritis   . BRCA2 gene mutation positive in female   . De Quervain's syndrome (tenosynovitis)    Left wrist  . Depression   . Family history of BRCA2 gene positive   . Fibromyalgia   . Follicular lymphoma grade 3a [facility]) oncologist-- dr Jana Hakim   dx 03-02-2018  s/p left axilly  node dissection (NHL),  no further treatment other than active survillance  . History of gout    02-21-2019  per pt has years since last episode  . History of radiation therapy    RIGHT BREAST COMPLETED 12/ 2012  . 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 History of TMJ syndrome   . Hyperlipidemia   . Hypothyroidism   . 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 Osteopenia 08/09/2011  . 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 Personal history of chemotherapy    COMPLETED 12/ 2012  . Personal history of radiation therapy   . Wears glasses     PAST SURGICAL HISTORY: Past Surgical History:  Procedure Laterality Date  . BREAST EXCISIONAL BIOPSY Left 2019  . BREAST LUMPECTOMY Right 2012  . BREAST LUMPECTOMY WITH AXILLARY LYMPH NODE BIOPSY Right 07/08/2010   W/  PAC INSERTION  . MANDIBLE SURGERY  1991   due to accident as a child. sx to straighten dental bite.  Marland Kitchen PORT-A-CATH REMOVAL  12/28/2010  . RADIOACTIVE SEED GUIDED AXILLARY SENTINEL LYMPH NODE Left 03/02/2018   Procedure: SEED TARGETED DEEP LEFT AXILLARY LYMPH NODE BIOPSY;  Surgeon: [name], MD;  Location: [place];  Service: General;  Laterality: Left;  . ROBOTIC ASSISTED SALPINGO OOPHERECTOMY Bilateral 02/25/2019   Procedure: XI ROBOTIC ASSISTED SALPINGO OOPHORECTOMY;  Surgeon: Everitt Amber, MD;  Location: [facility];  Service: Gynecology;  Laterality: Bilateral;    FAMILY HISTORY Family History  Problem Relation Age of Onset  . Cancer Maternal Aunt 75       colon - great maternal aunt  . Cancer Paternal Aunt 75       gyn- unsure if ovarian or uterine- met to lung  . Breast cancer Maternal Aunt 52  . Breast cancer Mother 85  . Prostate cancer Paternal Uncle        dx >50   . Heart disease Maternal Grandmother 80  . Pneumonia Maternal Grandfather 44  . Lung cancer Paternal Grandmother 70  . Leukemia Paternal Grandfather 80  . Pancreatic cancer Paternal Aunt 96  . Bladder Cancer Paternal Aunt 23  . Lung cancer Paternal Aunt   . Other Cousin        BRCA2 +  . Breast cancer Cousin        dx <50  . Colon cancer Neg Hx   The patient's father died May 22, 2015 apparently following multiple strokes. Patient's mother is passed away 09/19/2015, two weeks after being diagnosed with breast cancer, but she notes that her mother knew about a lump in her breast for 2 years and did not bring this to medical attention. She has no sisters, and had one brother, who is now deceased. She has a maternal aunt who was diagnosed with breast cancer before the age of 65 and a paternal aunt who was diagnosed with ovarian cancer before the age  of 40.   GYNECOLOGIC HISTORY: Menarche age 63, menopause about five to seven years ago.  She has been on hormone replacement for various periods most recently started estradiol June 10, 2010.  She never had any complications from any of the hormones she took and she has a Mirena IUD in place. She underwent BSO in 02/2019.   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  The [employer] brings them the hens, they gather the eggs and then [employer] sets the eggs up to hatch.  As of October 2020 she is "retiring" from the [employer].  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 They have two daughters "[name]" [name], 30, who works in [place], but lives at home and [name] "[name]" [place], 25, who has graduated from [employer].  [name] is my patient, with a positive BRCA2 mutation.  [name] notes that her mother passed away in 02-Sep-2016, two weeks after being diagnosed with breast cancer.    ADVANCED DIRECTIVES: In the absence of any  documentation to the contrary, the patient's spouse is their HCPOA.    HEALTH MAINTENANCE: Social History   Tobacco Use  . Smoking status: Never Smoker  . Smokeless tobacco: Never Used  Vaping Use  . Vaping Use: Never used  Substance Use Topics  . Alcohol use: Not Currently  . Drug use: Never     Colonoscopy: Never  PAP:  Bone density: 07/2013, -1.8  No Known Allergies  Current Outpatient Medications  Medication Sig Dispense Refill  . buPROPion (WELLBUTRIN XL) 150 MG 24 hr tablet TAKE 1 TABLET BY MOUTH DAILY- DO NOT STOP MEDICATION WITHOUT ALERTING YOUR PHYSICIAN 90 tablet 5  . citalopram (CELEXA) 40 MG tablet Take 1 tablet (40 mg total) by mouth daily. 90 tablet 4  . fluticasone (FLONASE) 50 MCG/ACT nasal spray Place 1 spray into both nostrils at bedtime.     Marland Kitchen ibuprofen (ADVIL) 800 MG tablet Take 1 tablet (800 mg total) by mouth every 8 (eight) hours as needed for moderate pain. For AFTER surgery only (Patient not taking: Reported on 03/21/2019) 30 tablet 0  . levothyroxine (SYNTHROID, LEVOTHROID) 88 MCG tablet Take 88 mcg by mouth daily before breakfast.     . meloxicam (MOBIC) 7.5 MG tablet Take 2 tablets (15 mg total) by mouth daily. 60 tablet 6  . senna-docusate (SENOKOT-S) 8.6-50 MG tablet Take 2 tablets by mouth at bedtime. For AFTER surgery, do not take if having diarrhea 30 tablet 0   No current facility-administered medications for this visit.    OBJECTIVE: white woman who appears stated age 60:   07/29/20 1505  BP: 139/79  Pulse: 82  Resp: 18  Temp: (!) 97.3 F (36.3 C)  SpO2: 97%     Body mass index is 36.18 kg/m.    ECOG FS: 1  Filed Weights   07/29/20 1505  Weight: 210 lb 12.8 oz (95.6 kg)    Sclerae unicteric, EOMs intact Wearing a mask No cervical or supraclavicular adenopathy Lungs no rales or rhonchi Heart regular rate and rhythm Abd soft, nontender, positive bowel sounds MSK no focal spinal tenderness, no upper extremity  lymphedema Neuro: nonfocal, well oriented, appropriate affect Breasts: The right breast is status post lumpectomy and radiation.  There is no evidence of local recurrence.  The left breast is benign.  Both axillae are benign and particularly I do not  detect any hidradenitis or any unusual smell   LAB RESULTS: Lab Results  Component Value Date   WBC 8.1 07/29/2020   NEUTROABS 4.5 07/29/2020   HGB 14.3 07/29/2020   HCT 42.1 07/29/2020   MCV 89.2 07/29/2020   PLT 184 07/29/2020      Chemistry      Component Value Date/Time   NA 141 07/29/2020 1428   NA 142 08/05/2014 0818   K 3.5 07/29/2020 1428   K 3.8 08/05/2014 0818   CL 105 07/29/2020 1428   CL 105 07/09/2012 0808   CO2 23 07/29/2020 1428   CO2 23 08/05/2014 0818   BUN 12 07/29/2020 1428   BUN 10.7 08/05/2014 0818   CREATININE 0.94 07/29/2020 1428   CREATININE 0.77 03/15/2018 1332   CREATININE 0.8 08/05/2014 0818      Component Value Date/Time   CALCIUM 9.3 07/29/2020 1428   CALCIUM 8.5 08/05/2014 0818   ALKPHOS 80 07/29/2020 1428   ALKPHOS 62 08/05/2014 0818   AST 24 07/29/2020 1428   AST 22 03/15/2018 1332   AST 22 08/05/2014 0818   ALT 17 07/29/2020 1428   ALT 23 03/15/2018 1332   ALT 20 08/05/2014 0818   BILITOT 0.8 07/29/2020 1428   BILITOT 0.5 03/15/2018 1332   BILITOT 0.59 08/05/2014 0818     Beta-2 microglobulin is normal.  LDH is minimally elevated, and no more than 4 months ago  STUDIES: No results found.   ASSESSMENT: 60 y.o. BRCA2 positive [name] woman   (1)  status post right lumpectomy and axillary lymph node dissection April 2012 for a T1c N1 M0, stage IIA invasive ductal carcinoma, grade 3, strongly estrogen and progesterone receptor positive, HER-2 negative with an MIB-1 of 26%,   (2)  treated adjuvantly with 4 cycles of dose dense doxorubicin and cyclophosphamide followed by 9 weekly doses of paclitaxel   (3)  radiation completed December of 2012.   (4)  She started letrozole  03/15/2011, switched to tamoxifen as of January 2016  (a) completed 5 years of antiestrogens February 2018  (b) anastrozole started April 2019  (5) Osteopenia, started alendronate (Fosamax) May 2013  (6) genetics testing 05/11/2017 through the Common Hereditary Cancers Panel + Myelodysplastic syndrome/Leukemia Panel was ordered from [facility] found a Positive: Pathogeic variant in BRCA2 c.[id number] (p.Leu2092Profs*7).   (a) there were no pathogenic mutations noted in APC, ATM, AXIN2, BARD1, BLM, BMPR1A, BRCA1, BRIP1, CDH1, CDK4, CDKN2A (p14ARF), CDKN2A (p16INK4a), CEBPA, CHEK2, CTNNA1, DICER1, EPCAM*, GATA2, GREM1*, HRAS, KIT, MEN1, MLH1, MSH2, MSH3, MSH6, MUTYH, NBN, NF1, PALB2, PDGFRA, PMS2, POLD1, POLE, PTEN, RAD50, RAD51C, RAD51D, RUNX1, SDHB, SDHC, SDHD, SMAD4, SMARCA4, STK11, TERC, TERT, TP53, TSC1, TSC2, VHL. The following genes were evaluated for sequence changes only:HOXB13*, NTHL1*, SDHA.  (b) A variant of uncertain significance in the gene MSH3 was also identified c.2695A>G (p.Met899Val).   (7) intensified screening:   (a) mammography every April  (b) breast MRI every October   (c) bi-annual breast exam  (8) breast cancer risk reduction: antiestrogens started January 2013, completed 8 years December 2020  (a) bone density agrees per imaging 07/31/2013 showed a T score -2.2  (9) ovarian cancer risk reduction: s/p BSO 02/25/2019 with benign pathology  (10) follicular B cell non-Hodgkin's lymphoma grade 3A diagnosed 03/02/2018 through left axillary lymph node biopsy and flow cytometry  (a) the cells are CD10, CD19, CD23, and HLA-DR positive, kappa restricted.  CD5 negative  (b) CT scans of the chest abdomen and pelvis 02/21/2018 shows measurable adenopathy  both above and below the diaphragm (largest lesions 1.5 cm left axilla, 1.3 cm left inguinal area).  (c) FLIPI2 at diagnosis: <60 y/o, Hb >12, largest node <6.0 cm; beta-2-microglobulin 1.8 (WNL); bone marrow not obtained  (d) HIV  and hepatitis B studies 03/15/2018 negative  (11) DEXA scan 07/31/2013 showed a T score of -1.8   PLAN:  Avynn is now just over 10 years out from definitive surgery for her breast cancer with no evidence of disease recurrence.  This is very favorable.  At this point, I feel comfortable releasing her from follow-up here.  She does carry a BRCA2 mutation.  This puts her at risk of future breast cancer.  We had previously suggested intensified screening but it is difficult for her for variety of reasons including financial to obtain yearly MRIs.  She has very low density breast which means mammograms are sensitive.  She needs mammography yearly and I have reentered the order so this can be resumed within the next month.  Her non-Hodgkin's lymphoma is very low-grade, and does not require therapy unless she develops anemia or thrombocytopenia due to the lymphoma or unless she develops significant "B" symptoms, which include unexplained weight loss of 10% or more body weight, unexplained recurrent fevers and drenching sweats, unexplained fatigue, or clinically significant palpable adenopathy  She does have fatigue but I think that is partly due to weight and partly due to lack of exercise.  She is outdoors and on her feet but she is not taking 5000 steps a day which really is about the minimum I think she would need to do if she wanted to really pick up a little bit more energy  At this point I am comfortable releasing her to her primary care physician.  All she needs as far as the breast cancer and BRCA2 is concerned is her yearly mammography and a yearly physician breast exam.  All she needs as far as the lymphoma is concerned is a yearly CBC, physical exam for adenopathy, and checking for "B" symptoms as described.  We will be glad to see [name] again at any point in the future if and when the need arises but at this point we are making no further routine appointments for her here.  Total encounter  time 25 minutes.*  Total encounter time 35 minutes.*   [name], Virgie Dad, MD  07/29/20 3:42 PM Medical Oncology and Hematology [facility] [address] [place], [place] [address] Tel. [phone]    Fax. [phone]    I, Wilburn Mylar am acting as scribe for Dr. Virgie Dad. Wilhelmena Zea.  I, Lurline Del MD, have reviewed the above documentation for accuracy and completeness, and I agree with the above.   *Total Encounter Time as defined by the Centers for Medicare and Medicaid Services includes, in addition to the face-to-face time of a patient visit (documented in the note above) non-face-to-face time: obtaining and reviewing outside history, ordering and reviewing medications, tests or procedures, care coordination (communications with other health care professionals or caregivers) and documentation in the medical record.

## 2020-08-07 ENCOUNTER — Ambulatory Visit
Admission: RE | Admit: 2020-08-07 | Discharge: 2020-08-07 | Disposition: A | Discharge status: Home / Self Care | Payer: 59 | Source: Ambulatory Visit | Attending: Oncology | Admitting: Oncology

## 2020-08-07 DIAGNOSIS — C50911 Malignant neoplasm of unspecified site of right female breast: Secondary | ICD-10-CM

## 2020-08-07 DIAGNOSIS — Z170 Estrogen receptor positive status [ER+]: Secondary | ICD-10-CM

## 2020-08-07 DIAGNOSIS — C8234 Follicular lymphoma grade IIIa, lymph nodes of axilla and upper limb: Secondary | ICD-10-CM

## 2020-08-07 HISTORY — DX: Malignant neoplasm of unspecified site of unspecified female breast: C50.919

## 2023-05-09 ENCOUNTER — Encounter: Payer: Self-pay | Admitting: Family Medicine

## 2023-05-09 ENCOUNTER — Other Ambulatory Visit: Payer: Self-pay | Admitting: Family Medicine

## 2023-05-09 ENCOUNTER — Ambulatory Visit: Payer: No Typology Code available for payment source | Admitting: Family Medicine

## 2023-05-09 VITALS — BP 113/80 | HR 81 | Temp 98.5°F | Ht 64.0 in | Wt 189.0 lb

## 2023-05-09 DIAGNOSIS — E079 Disorder of thyroid, unspecified: Secondary | ICD-10-CM

## 2023-05-09 DIAGNOSIS — E66811 Obesity, class 1: Secondary | ICD-10-CM

## 2023-05-09 DIAGNOSIS — I10 Essential (primary) hypertension: Secondary | ICD-10-CM | POA: Diagnosis not present

## 2023-05-09 DIAGNOSIS — Z136 Encounter for screening for cardiovascular disorders: Secondary | ICD-10-CM

## 2023-05-09 DIAGNOSIS — M8580 Other specified disorders of bone density and structure, unspecified site: Secondary | ICD-10-CM

## 2023-05-09 DIAGNOSIS — E559 Vitamin D deficiency, unspecified: Secondary | ICD-10-CM

## 2023-05-09 DIAGNOSIS — Z6832 Body mass index (BMI) 32.0-32.9, adult: Secondary | ICD-10-CM

## 2023-05-09 DIAGNOSIS — E6609 Other obesity due to excess calories: Secondary | ICD-10-CM

## 2023-05-09 MED ORDER — CITALOPRAM HYDROBROMIDE 40 MG PO TABS: 40.0000 mg | ORAL_TABLET | Freq: Every day | ORAL | 1 refills | Status: DC

## 2023-05-09 MED ORDER — MELOXICAM 7.5 MG PO TABS: 15.0000 mg | ORAL_TABLET | Freq: Every day | ORAL | 1 refills | Status: DC

## 2023-05-09 MED ORDER — LEVOTHYROXINE SODIUM 88 MCG PO TABS: 88.0000 ug | ORAL_TABLET | Freq: Every day | ORAL | 1 refills | Status: DC

## 2023-05-09 MED ORDER — LOSARTAN POTASSIUM 50 MG PO TABS: 50.0000 mg | ORAL_TABLET | Freq: Two times a day (BID) | ORAL | 1 refills | Status: DC

## 2023-05-09 NOTE — Patient Instructions (Addendum)
 Monitoring your BP at home   Your BP was elevated today in office  Please keep a log of your BP at home.  The best time to take BP is 1st thing in the morning after waking.  Sit for 5 minutes with feet flat on the floor, arm at heart level.  Options for BP cuffs are at [employer], [employer], [employer], [facility] & [facility].  We will review measurements at follow up and determine plan for BP management.  If you have access, you can send a message in MyChart with your measurements prior to your follow up appointment.  The brand I recommend to get is [employer]ron (Bronze).   Reports numbers in 2 weeks.    Corcidin

## 2023-05-09 NOTE — Progress Notes (Unsigned)
 New Patient Office Visit  Subjective   Patient ID: [name], female    DOB: [birth year]  Age: 63 y.o. MRN: [id number]  CC:  Chief Complaint  Patient presents with   New Patient (Initial Visit)    Establish care Med refill  Blood Pressure   HPI [name] presents to establish care Previously established with Dr. Suan Halter in [facility], needs to switch due to insurance coverage. She is self-employed and has had multiple insurance changes.    Reports history of HTN, OSA, thyroid dysfunction, vitamin D deficiency, breast cancer (2012). She completed surgery at that time. Completed radiation and chemotherapy. She was then diagnosed with non-Hodgkin's lymphoma. States that she does not have any trouble at this time. States that she was released from care at that time. She also had oophorectomy, retained uterus and cervix.   Primary HTN  Started on losartan last September. Doing well since increased dose.  States that she has been waking up with headaches. Reports at home taking BP only when she has a headache and it was 144/85.   RA  States that she has a history of RA.Taking ibuprofen and tylenol, not helping. Previously taking mobic, which was helping her.   Thyroid: Patient presents for evaluation of hypothyroidism. Denies fatigue, weight changes, heat/cold intolerance, bowel/skin changes or CVS symptoms.  Anxiety  She is currently taking celexa. States that she still has some anxiety especially with doctor appt and going to cancer center. States that she rarely has trouble going to sleep. Denies SI. Does not desire counseling at this time.   Outpatient Encounter Medications as of 05/09/2023  Medication Sig   alendronate (FOSAMAX) 35 MG tablet Take by mouth.   chlorpheniramine (CHLOR-TRIMETON) 4 MG tablet Take by mouth.   citalopram (CELEXA) 40 MG tablet Take 1 tablet (40 mg total) by mouth daily.   hydrOXYzine (ATARAX) 25 MG tablet Take by mouth.   levothyroxine  (SYNTHROID, LEVOTHROID) 88 MCG tablet Take 88 mcg by mouth daily before breakfast.    losartan (COZAAR) 100 MG tablet    meloxicam (MOBIC) 7.5 MG tablet Take 2 tablets (15 mg total) by mouth daily.   Triamcinolone Acetonide (NASACORT ALLERGY 24HR NA)    valACYclovir (VALTREX) 1000 MG tablet Take 1,000 mg by mouth 2 (two) times daily.   [DISCONTINUED] buPROPion (WELLBUTRIN XL) 150 MG 24 hr tablet TAKE 1 TABLET BY MOUTH DAILY- DO NOT STOP MEDICATION WITHOUT ALERTING YOUR PHYSICIAN   [DISCONTINUED] fluticasone (FLONASE) 50 MCG/ACT nasal spray Place 1 spray into both nostrils at bedtime.    [DISCONTINUED] ibuprofen (ADVIL) 800 MG tablet Take 1 tablet (800 mg total) by mouth every 8 (eight) hours as needed for moderate pain. For AFTER surgery only (Patient not taking: Reported on 03/21/2019)   [DISCONTINUED] senna-docusate (SENOKOT-S) 8.6-50 MG tablet Take 2 tablets by mouth at bedtime. For AFTER surgery, do not take if having diarrhea   No facility-administered encounter medications on file as of 05/09/2023.    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[facility]) oncologist-- dr Darnelle Catalan   dx 03-02-2018  s/p left axilly node dissection (NHL),  no further treatment other than active survillance  History of gout    02-21-2019  per pt has years since last episode   History of radiation therapy    RIGHT BREAST COMPLETED 12/ 2012   History of right breast cancer oncologist--  dr Darnelle Catalan--  no recurrence   dx 04/ 2012--  IDC, Stage IIA, Grade 3, 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);  07-08-2010 s/p breast lumpecotmy w/ node dissection;  completed chemoradiation 12/ 2012   History of TMJ syndrome    Hyperlipidemia    Hypothyroidism    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 sx to straighten dental bite.   PORT-A-CATH REMOVAL  12/28/2010   RADIOACTIVE SEED GUIDED AXILLARY SENTINEL LYMPH NODE Left 03/02/2018   Procedure: SEED TARGETED DEEP LEFT AXILLARY LYMPH NODE BIOPSY;  Surgeon: Almond Lint, MD;  Location: [facility] OR;  Service: General;  Laterality: Left;   ROBOTIC ASSISTED SALPINGO OOPHERECTOMY Bilateral 02/25/2019   Procedure: XI ROBOTIC ASSISTED SALPINGO OOPHORECTOMY;  Surgeon: Adolphus Birchwood, MD;  Location: [facility];  Service: Gynecology;  Laterality: Bilateral;    Family History  Problem Relation Age of Onset   Cancer Maternal Aunt 101       colon - great maternal aunt   Cancer Paternal Aunt 42       gyn- unsure if ovarian or uterine- met to lung   Breast cancer Maternal Aunt 70   Breast cancer Mother 25   Prostate cancer Paternal Uncle        dx >50   Heart disease Maternal Grandmother 53   Pneumonia Maternal Grandfather 35   Lung cancer Paternal Grandmother 55   Leukemia Paternal Grandfather 15   Pancreatic cancer Paternal Aunt 57   Bladder Cancer Paternal Aunt 18   Lung cancer Paternal Aunt    Other Cousin        BRCA2 +   Breast cancer Cousin        dx <50   Colon cancer Neg Hx     Social History   Socioeconomic History   Marital status: Married    Spouse name: Not on file   Number of children: Not on file   Years of education: Not on file   Highest education level: Bachelor's degree (e.g.,  BA, AB, BS)  Occupational History   Not on file  Tobacco Use   Smoking status: Never   Smokeless tobacco: Never  Vaping Use   Vaping status: Never Used  Substance and Sexual Activity   Alcohol use: Not Currently   Drug use: Never   Sexual activity: Not on file  Other Topics Concern   Not on file  Social History Narrative   Not on file   Social Drivers of Health   Financial Resource Strain: Low Risk  (05/08/2023)   Overall Financial Resource Strain (CARDIA)    Difficulty of Paying Living Expenses: Not very hard  Food Insecurity: No Food Insecurity (05/08/2023)  Hunger Vital Sign    Worried About Running Out of Food in the Last Year: Never true    Ran Out of Food in the Last Year: Never true  Transportation Needs: No Transportation Needs (05/08/2023)   PRAPARE - Administrator, Civil Service (Medical): No    Lack of Transportation (Non-Medical): No  Physical Activity: Unknown (05/08/2023)   Exercise Vital Sign    Days of Exercise per Week: 3 days    Minutes of Exercise per Session: Patient declined  Stress: Stress Concern Present (05/08/2023)   [employer] of Occupational Health - Occupational Stress Questionnaire    Feeling of Stress : To some extent  Social Connections: Moderately Integrated (05/08/2023)   Social Connection and Isolation Panel [NHANES]    Frequency of Communication with Friends and Family: Twice a week    Frequency of Social Gatherings with Friends and Family: Once a week    Attends Religious Services: 1 to 4 times per year    Active Member of [employer] or Organizations: No    Attends Engineer, structural: Not on file    Marital Status: Married  Catering manager Violence: Not on file    ROS As per HPI   Objective   BP 113/80   Pulse 81   Temp 98.5 F (36.9 C)   Ht 5\' 4"  (1.626 m)   Wt 189 lb (85.7 kg)   SpO2 94%   BMI 32.44 kg/m   Physical Exam Constitutional:      General: She is awake. She is not in acute distress.     Appearance: Normal appearance. She is well-developed and well-groomed. She is obese. She is not ill-appearing, toxic-appearing or diaphoretic.  Cardiovascular:     Rate and Rhythm: Normal rate and regular rhythm.     Pulses: Normal pulses.          Radial pulses are 2+ on the right side and 2+ on the left side.       Posterior tibial pulses are 2+ on the right side and 2+ on the left side.     Heart sounds: Normal heart sounds. No murmur heard.    No gallop.  Pulmonary:     Effort: Pulmonary effort is normal. No respiratory distress.     Breath sounds: Normal breath sounds. No stridor. No wheezing, rhonchi or rales.  Musculoskeletal:     Cervical back: Full passive range of motion without pain and neck supple.     Right lower leg: No edema.     Left lower leg: No edema.  Skin:    General: Skin is warm.     Capillary Refill: Capillary refill takes less than 2 seconds.  Neurological:     General: No focal deficit present.     Mental Status: She is alert, oriented to person, place, and time and easily aroused. Mental status is at baseline.     GCS: GCS eye subscore is 4. GCS verbal subscore is 5. GCS motor subscore is 6.     Motor: No weakness.  Psychiatric:        Attention and Perception: Attention and perception normal.        Mood and Affect: Mood and affect normal.        Speech: Speech normal.        Behavior: Behavior normal. Behavior is cooperative.        Thought Content: Thought content normal. Thought content does not include homicidal or suicidal ideation. Thought content  does not include homicidal or suicidal plan.        Cognition and Memory: Cognition and memory normal.        Judgment: Judgment normal.       05/09/2023    3:06 PM  Depression screen PHQ 2/9  Decreased Interest 0  Down, Depressed, Hopeless 0  PHQ - 2 Score 0  Altered sleeping 1  Tired, decreased energy 1  Change in appetite 0  Feeling bad or failure about yourself  1  Trouble concentrating 0   Moving slowly or fidgety/restless 1  Suicidal thoughts 1  PHQ-9 Score 5  Difficult doing work/chores Very difficult      05/09/2023    3:09 PM  GAD 7 : Generalized Anxiety Score  Nervous, Anxious, on Edge 1  Control/stop worrying 0  Worry too much - different things 0  Trouble relaxing 0  Easily annoyed or irritable 0  Afraid - awful might happen 0  Anxiety Difficulty Not difficult at all   Assessment & Plan:  1. Thyroid disease (Primary) Labs as below. Will communicate results to patient once available. Will await results to determine next steps.  - Thyroid Panel With TSH  2. Primary hypertension Well controlled. Labs as below. Will communicate results to patient once available. Will await results to determine next steps. Continue on current regimen. Monitor BP at home especially with symptoms and report measurements in 2 weeks.  - CBC with Differential/Platelet - CMP14+EGFR  3. Class 1 obesity due to excess calories with serious comorbidity and body mass index (BMI) of 32.0 to 32.9 in adult Discussed with patient to continue healthy lifestyle choices, including diet (rich in fruits, vegetables, and lean proteins, and low in salt and simple carbohydrates) and exercise (at least 30 minutes of moderate physical activity daily). Limit beverages high is sugar. Recommended at least 80-100 oz of water daily.  Labs as below. Will communicate results to patient once available. Will await results to determine next steps.  - VITAMIN D 25 Hydroxy (Vit-D Deficiency, Fractures)  4. Encounter for screening for cardiovascular disorders Labs as below. Will communicate results to patient once available. Will await results to determine next steps. Fasting unknown.  - Lipid panel  5. Vitamin D deficiency Labs as below. Will communicate results to patient once available. Will await results to determine next steps.  - VITAMIN D 25 Hydroxy (Vit-D Deficiency, Fractures)  6. Osteopenia, unspecified  location Imaging as below. Will communicate results to patient once available. Will await results to determine next steps.  - DG [facility] DEXA  The above assessment and management plan was discussed with the patient. The patient verbalized understanding of and has agreed to the management plan using shared-decision making. Patient is aware to call the clinic if they develop any new symptoms or if symptoms fail to improve or worsen. Patient is aware when to return to the clinic for a follow-up visit. Patient educated on when it is appropriate to go to the emergency department.   Return in about 3 months (around 08/06/2023) for Chronic Condition Follow up.   Neale Burly, DNP-FNP [facility] [address] [place], [place] [address] [phone]

## 2023-05-10 LAB — CBC WITH DIFFERENTIAL/PLATELET
Basophils Absolute: 0.1 10*3/uL (ref 0.0–0.2)
Basos: 1 %
EOS (ABSOLUTE): 0.5 10*3/uL — ABNORMAL HIGH (ref 0.0–0.4)
Eos: 7 %
Hematocrit: 43.8 % (ref 34.0–46.6)
Hemoglobin: 14.5 g/dL (ref 11.1–15.9)
Immature Grans (Abs): 0 10*3/uL (ref 0.0–0.1)
Immature Granulocytes: 0 %
Lymphocytes Absolute: 1.9 10*3/uL (ref 0.7–3.1)
Lymphs: 24 %
MCH: 30.6 pg (ref 26.6–33.0)
MCHC: 33.1 g/dL (ref 31.5–35.7)
MCV: 92 fL (ref 79–97)
Monocytes Absolute: 0.9 10*3/uL (ref 0.1–0.9)
Monocytes: 12 %
Neutrophils Absolute: 4.2 10*3/uL (ref 1.4–7.0)
Neutrophils: 56 %
Platelets: 195 10*3/uL (ref 150–450)
RBC: 4.74 x10E6/uL (ref 3.77–5.28)
RDW: 12.8 % (ref 11.7–15.4)
WBC: 7.6 10*3/uL (ref 3.4–10.8)

## 2023-05-10 LAB — CMP14+EGFR
ALT: 16 [IU]/L (ref 0–32)
AST: 19 [IU]/L (ref 0–40)
Albumin: 4.2 g/dL (ref 3.9–4.9)
Alkaline Phosphatase: 89 [IU]/L (ref 44–121)
BUN/Creatinine Ratio: 13 (ref 12–28)
BUN: 9 mg/dL (ref 8–27)
Bilirubin Total: 0.4 mg/dL (ref 0.0–1.2)
CO2: 22 mmol/L (ref 20–29)
Calcium: 9.6 mg/dL (ref 8.7–10.3)
Chloride: 101 mmol/L (ref 96–106)
Creatinine, Ser: 0.68 mg/dL (ref 0.57–1.00)
Globulin, Total: 2.6 g/dL (ref 1.5–4.5)
Glucose: 76 mg/dL (ref 70–99)
Potassium: 4.4 mmol/L (ref 3.5–5.2)
Sodium: 140 mmol/L (ref 134–144)
Total Protein: 6.8 g/dL (ref 6.0–8.5)
eGFR: 98 mL/min/{1.73_m2} (ref >59)

## 2023-05-10 LAB — LIPID PANEL
Chol/HDL Ratio: 5.4 {ratio} — ABNORMAL HIGH (ref 0.0–4.4)
Cholesterol, Total: 302 mg/dL — ABNORMAL HIGH (ref 100–199)
HDL: 56 mg/dL (ref >39)
LDL Chol Calc (NIH): 182 mg/dL — ABNORMAL HIGH (ref 0–99)
Triglycerides: 329 mg/dL — ABNORMAL HIGH (ref 0–149)
VLDL Cholesterol Cal: 64 mg/dL — ABNORMAL HIGH (ref 5–40)

## 2023-05-10 LAB — THYROID PANEL WITH TSH
Free Thyroxine Index: 2.2 (ref 1.2–4.9)
T3 Uptake Ratio: 26 % (ref 24–39)
T4, Total: 8.3 ug/dL (ref 4.5–12.0)
TSH: 4.89 u[IU]/mL — ABNORMAL HIGH (ref 0.450–4.500)

## 2023-05-10 LAB — VITAMIN D 25 HYDROXY (VIT D DEFICIENCY, FRACTURES): Vit D, 25-Hydroxy: 20.1 ng/mL — ABNORMAL LOW (ref 30.0–100.0)

## 2023-05-11 ENCOUNTER — Encounter: Payer: Self-pay | Admitting: Family Medicine

## 2023-05-11 NOTE — Progress Notes (Signed)
 Cholesterol is elevated. Diet encouraged - increase intake of fresh fruits and vegetables, increase intake of lean proteins. Bake, broil, or grill foods. Avoid fried, greasy, and fatty foods. Avoid fast foods. Increase intake of fiber-rich whole grains. Exercise encouraged - at least 150 minutes per week and advance as tolerated. Can also try red yeast rice and we will recheck in 3 months.  The 10-year ASCVD risk score (Arnett DK, et al., 2019) is: 6.1%. based on ASCVD risk score does not have to start a statin at this time unless she would like. Slight abnormality on TSH, T4 within normal range. Can monitor for symptoms and repeat in 3 months. Vitamin D low. Recommend [id number] international units daily.

## 2023-07-09 ENCOUNTER — Other Ambulatory Visit: Payer: Self-pay | Admitting: Family Medicine

## 2023-08-08 ENCOUNTER — Ambulatory Visit: Payer: No Typology Code available for payment source | Admitting: Family Medicine

## 2023-08-08 ENCOUNTER — Ambulatory Visit (INDEPENDENT_AMBULATORY_CARE_PROVIDER_SITE_OTHER)

## 2023-08-08 ENCOUNTER — Encounter: Payer: Self-pay | Admitting: Family Medicine

## 2023-08-08 VITALS — BP 119/78 | HR 76 | Temp 97.2°F | Ht 64.0 in | Wt 193.4 lb

## 2023-08-08 DIAGNOSIS — M8588 Other specified disorders of bone density and structure, other site: Secondary | ICD-10-CM | POA: Diagnosis not present

## 2023-08-08 DIAGNOSIS — Z1501 Genetic susceptibility to malignant neoplasm of breast: Secondary | ICD-10-CM

## 2023-08-08 DIAGNOSIS — G4730 Sleep apnea, unspecified: Secondary | ICD-10-CM

## 2023-08-08 DIAGNOSIS — C8234 Follicular lymphoma grade IIIa, lymph nodes of axilla and upper limb: Secondary | ICD-10-CM

## 2023-08-08 DIAGNOSIS — F419 Anxiety disorder, unspecified: Secondary | ICD-10-CM

## 2023-08-08 DIAGNOSIS — E782 Mixed hyperlipidemia: Secondary | ICD-10-CM | POA: Diagnosis not present

## 2023-08-08 DIAGNOSIS — Z1509 Genetic susceptibility to other malignant neoplasm: Secondary | ICD-10-CM

## 2023-08-08 DIAGNOSIS — E079 Disorder of thyroid, unspecified: Secondary | ICD-10-CM | POA: Diagnosis not present

## 2023-08-08 DIAGNOSIS — E559 Vitamin D deficiency, unspecified: Secondary | ICD-10-CM | POA: Insufficient documentation

## 2023-08-08 DIAGNOSIS — R0981 Nasal congestion: Secondary | ICD-10-CM

## 2023-08-08 DIAGNOSIS — M8580 Other specified disorders of bone density and structure, unspecified site: Secondary | ICD-10-CM | POA: Diagnosis not present

## 2023-08-08 DIAGNOSIS — C8590 Non-Hodgkin lymphoma, unspecified, unspecified site: Secondary | ICD-10-CM | POA: Insufficient documentation

## 2023-08-08 MED ORDER — DOXYCYCLINE HYCLATE 100 MG PO TABS: 100.0000 mg | ORAL_TABLET | Freq: Two times a day (BID) | ORAL | 0 refills | Status: AC

## 2023-08-08 MED ORDER — HYDROXYZINE HCL 25 MG PO TABS: 25.0000 mg | ORAL_TABLET | Freq: Three times a day (TID) | ORAL | 0 refills | Status: DC | PRN

## 2023-08-08 NOTE — Progress Notes (Signed)
 Subjective:  Patient ID: [name], female    DOB: Jul 15, 1960, 63 y.o.   MRN: [id number]  Patient Care Team: Chrystine Crate, FNP as PCP - General (Family Medicine) Magrinat, Rozella Cornfield, MD (Inactive) as Consulting Physician (Hematology and Oncology) Lockie Rima, MD as Consulting Physician (General Surgery) Johna Myers, MD as Consulting Physician (Radiation Oncology) Bonita Bussing, MD as Consulting Physician (Obstetrics and Gynecology) Suellyn Emory, NP as Nurse Practitioner (Gynecologic Oncology) Leontine Rana, MD (Inactive) as Consulting Physician (Obstetrics and Gynecology)   Chief Complaint:  Medical Management of Chronic Issues (Patient states she's pretty sure she has a sinus infection.)   HPI: [name] is a 63 y.o. female presenting on 08/08/2023 for Medical Management of Chronic Issues (Patient states she's pretty sure she has a sinus infection.)  HPI Mixed hyperlipidemia She has started RYR. States that she has some mild cramping at night. Reports that she tried statin in the past and this is better than that. She is not making changes to diet or exercise.   Thyroid  disease  Reports temperature swings. Denies other side effects. Compliant with levothyroxine .   Sleep apnea in adult States that she is not wearing a cpap.  Reports that her husband notes that she is no longer having apneic episodes during sleep.   Osteopenia, unspecified location She is taking calcium, magnesium, and vitamin D  supplement.  She is taking glucosamine and chondroitin  She has not been taking fosamax . She has been off of it for ~5 years. States that she did take it for 5 years while she was taking chemotherapy.   Anxiety States that sometimes she notices that she is very anxious even while she is at home. She has support with husband and daughter. She feels that it is controlled. Denies thought of self harm. Would like to restart hydroxyzine as she still has some  issues with sleeping and anxiousness during the day.   BRCA2 positive/NHL Denies any B symptoms or changes to fatigue. Due for mammogram.   Sinus congestion  States that she started with symptoms last week. States that she has green rhinorrhea, fatigue, cough with production, PND, sinus pressure. Denies fever, N/V, ear pain, teeth pain. States that she gets these frequently. Has tried chlor tabs and alka seltzer plus at night. No recent abx.   Relevant past medical, surgical, family, and social history reviewed and updated as indicated.  Allergies and medications reviewed and updated. Data reviewed: Chart in Epic.   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   De Quervain's syndrome (tenosynovitis)    Left wrist   Depression    Family history of BRCA2 gene positive    Fibromyalgia    Follicular lymphoma grade 3a [facility]) oncologist-- dr Charolett Copes   dx 03-02-2018  s/p left axilly node dissection (NHL),  no further treatment other than active survillance   History of gout    02-21-2019  per pt has years since last episode   History of radiation therapy    RIGHT BREAST COMPLETED 12/ 2012   History of right breast cancer oncologist--  dr Charolett Copes--  no recurrence   dx 04/ 2012--  IDC, Stage IIA, Grade 3, 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);  07-08-2010 s/p breast lumpecotmy w/ node dissection;  completed chemoradiation 12/ 2012   History of TMJ syndrome    Hyperlipidemia    Hypothyroidism    Malignant neoplasm of upper-outer quadrant of right breast in female, estrogen receptor positive (HCC) 04/10/2017   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 sx to straighten dental bite.   PORT-A-CATH REMOVAL  12/28/2010   RADIOACTIVE SEED GUIDED AXILLARY SENTINEL LYMPH NODE Left 03/02/2018   Procedure: SEED TARGETED DEEP LEFT AXILLARY LYMPH NODE BIOPSY;  Surgeon: Lockie Rima, MD;  Location: [facility] OR;  Service: General;  Laterality: Left;   ROBOTIC ASSISTED SALPINGO OOPHERECTOMY Bilateral 02/25/2019   Procedure: XI ROBOTIC ASSISTED SALPINGO OOPHORECTOMY;  Surgeon: Alphonso Aschoff, MD;  Location: [facility];  Service: Gynecology;  Laterality: Bilateral;    Social History   Socioeconomic History   Marital status: Married    Spouse name: Not on file   Number of children: Not on file   Years of education: Not on file   Highest education level: Bachelor's degree (e.g., BA, AB, BS)  Occupational History   Not on file  Tobacco Use   Smoking status: Never   Smokeless tobacco: Never  Vaping Use   Vaping status: Never Used  Substance and Sexual Activity   Alcohol use: Not Currently   Drug use: Never   Sexual activity: Not on file  Other Topics Concern   Not on file  Social History Narrative   Not on file   Social Drivers of Health   Financial Resource Strain: Low Risk  (05/08/2023)   Overall Financial Resource Strain (CARDIA)    Difficulty of Paying Living Expenses: Not very hard  Food Insecurity: No Food Insecurity (05/08/2023)   Hunger Vital Sign    Worried About Running Out of Food in the Last Year: Never true    Ran Out of Food in the Last Year: Never true   Transportation Needs: No Transportation Needs (05/08/2023)   PRAPARE - Administrator, Civil Service (Medical): No    Lack of Transportation (Non-Medical): No  Physical Activity: Unknown (05/08/2023)   Exercise Vital Sign    Days of Exercise per Week: 3 days    Minutes of Exercise per Session: Patient declined  Stress: Stress Concern Present (05/08/2023)   [employer] of Occupational Health - Occupational Stress Questionnaire    Feeling of Stress : To some extent  Social Connections: Moderately Integrated (05/08/2023)   Social Connection and Isolation Panel [NHANES]    Frequency of Communication with Friends and Family: Twice a week    Frequency of Social Gatherings with Friends and Family: Once a week    Attends Religious Services: 1 to 4 times per year    Active Member of [employer] or Organizations: No    Attends Banker Meetings: Not on  file    Marital Status: Married  Catering manager Violence: Not on file    Outpatient Encounter Medications as of 08/08/2023  Medication Sig   aspirin 81 MG chewable tablet Chew 81 mg by mouth daily.   chlorpheniramine (CHLOR-TRIMETON) 4 MG tablet Take by mouth.   citalopram  (CELEXA ) 40 MG tablet Take 1 tablet (40 mg total) by mouth daily.   levothyroxine  (SYNTHROID ) 88 MCG tablet TAKE 1 TABLET BY MOUTH DAILY BEFORE BREAKFAST.   losartan  (COZAAR ) 50 MG tablet Take 1 tablet (50 mg total) by mouth in the morning and at bedtime.   meloxicam  (MOBIC ) 7.5 MG tablet TAKE 2 TABLETS BY MOUTH EVERY DAY   Triamcinolone Acetonide (NASACORT ALLERGY 24HR NA)    valACYclovir (VALTREX) 1000 MG tablet Take 1,000 mg by mouth 2 (two) times daily.   alendronate  (FOSAMAX ) 35 MG tablet Take by mouth. (Patient not taking: Reported on 08/08/2023)   hydrOXYzine (ATARAX) 25 MG tablet Take by mouth. (Patient not taking: Reported on 08/08/2023)   losartan  (COZAAR ) 100 MG tablet    No facility-administered encounter medications on file as of 08/08/2023.     No Known Allergies  Review of Systems As per HPI  Objective:  BP 119/78   Pulse 76   Temp (!) 97.2 F (36.2 C)   Ht 5\' 4"  (1.626 m)   Wt 193 lb 6.4 oz (87.7 kg)   SpO2 94%   BMI 33.20 kg/m    Wt Readings from Last 3 Encounters:  08/08/23 193 lb 6.4 oz (87.7 kg)  05/09/23 189 lb (85.7 kg)  07/29/20 210 lb 12.8 oz (95.6 kg)   Physical Exam Constitutional:      General: She is awake. She is not in acute distress.    Appearance: Normal appearance. She is well-developed and well-groomed. She is not ill-appearing, toxic-appearing or diaphoretic.  HENT:     Right Ear: A middle ear effusion is present. Tympanic membrane is injected and erythematous.     Left Ear: A middle ear effusion is present. Tympanic membrane is injected and erythematous.     Nose: Congestion and rhinorrhea present. Rhinorrhea is clear.     Left Nostril: Occlusion present.     Right Turbinates: Enlarged and swollen.     Left Turbinates: Enlarged and swollen.     Right Sinus: Maxillary sinus tenderness and frontal sinus tenderness present.     Left Sinus: Maxillary sinus tenderness and frontal sinus tenderness present.     Mouth/Throat:     Lips: Pink. No lesions.     Mouth: Mucous membranes are moist.     Pharynx: Posterior oropharyngeal erythema and postnasal drip present. No pharyngeal swelling.     Tonsils: No tonsillar exudate or tonsillar abscesses.  Cardiovascular:     Rate and Rhythm: Normal rate and regular rhythm.     Pulses: Normal pulses.          Radial pulses are 2+ on the right side and 2+ on the left side.       Posterior tibial pulses are 2+ on the right side and 2+ on the left side.     Heart sounds: Normal heart sounds. No murmur heard.    No gallop.  Pulmonary:     Effort: Pulmonary effort is normal. No respiratory distress.     Breath sounds: Normal breath sounds. No stridor. No wheezing, rhonchi or rales.  Musculoskeletal:     Cervical back: Full passive range of motion without  pain and neck supple.  Right lower leg: No edema.     Left lower leg: No edema.  Lymphadenopathy:     Head:     Right side of head: No submental, submandibular, tonsillar, preauricular or posterior auricular adenopathy.     Left side of head: No submental, submandibular, tonsillar, preauricular or posterior auricular adenopathy.     Cervical: No cervical adenopathy.     Right cervical: No superficial cervical adenopathy.    Left cervical: No superficial cervical adenopathy.  Skin:    General: Skin is warm.     Capillary Refill: Capillary refill takes less than 2 seconds.  Neurological:     General: No focal deficit present.     Mental Status: She is alert, oriented to person, place, and time and easily aroused. Mental status is at baseline.     GCS: GCS eye subscore is 4. GCS verbal subscore is 5. GCS motor subscore is 6.     Motor: No weakness.  Psychiatric:        Attention and Perception: Attention and perception normal.        Mood and Affect: Mood and affect normal.        Speech: Speech normal.        Behavior: Behavior normal. Behavior is cooperative.        Thought Content: Thought content normal. Thought content does not include homicidal or suicidal ideation. Thought content does not include homicidal or suicidal plan.        Cognition and Memory: Cognition and memory normal.        Judgment: Judgment normal.     Results for orders placed or performed in visit on 05/09/23  CBC with Differential/Platelet   Collection Time: 05/09/23  3:54 PM  Result Value Ref Range   WBC 7.6 3.4 - 10.8 x10E3/uL   RBC 4.74 3.77 - 5.28 x10E6/uL   Hemoglobin 14.5 11.1 - 15.9 g/dL   Hematocrit 09.8 11.9 - 46.6 %   MCV 92 79 - 97 fL   MCH 30.6 26.6 - 33.0 pg   MCHC 33.1 31.5 - 35.7 g/dL   RDW 14.7 82.9 - 56.2 %   Platelets 195 150 - 450 x10E3/uL   Neutrophils 56 Not Estab. %   Lymphs 24 Not Estab. %   Monocytes 12 Not Estab. %   Eos 7 Not Estab. %   Basos 1 Not Estab. %    Neutrophils Absolute 4.2 1.4 - 7.0 x10E3/uL   Lymphocytes Absolute 1.9 0.7 - 3.1 x10E3/uL   Monocytes Absolute 0.9 0.1 - 0.9 x10E3/uL   EOS (ABSOLUTE) 0.5 (H) 0.0 - 0.4 x10E3/uL   Basophils Absolute 0.1 0.0 - 0.2 x10E3/uL   Immature Granulocytes 0 Not Estab. %   Immature Grans (Abs) 0.0 0.0 - 0.1 x10E3/uL  CMP14+EGFR   Collection Time: 05/09/23  3:54 PM  Result Value Ref Range   Glucose 76 70 - 99 mg/dL   BUN 9 8 - 27 mg/dL   Creatinine, Ser 1.30 0.57 - 1.00 mg/dL   eGFR 98 >86 VH/QIO/9.62   BUN/Creatinine Ratio 13 12 - 28   Sodium 140 134 - 144 mmol/L   Potassium 4.4 3.5 - 5.2 mmol/L   Chloride 101 96 - 106 mmol/L   CO2 22 20 - 29 mmol/L   Calcium 9.6 8.7 - 10.3 mg/dL   Total Protein 6.8 6.0 - 8.5 g/dL   Albumin 4.2 3.9 - 4.9 g/dL   Globulin, Total 2.6 1.5 - 4.5 g/dL   Bilirubin Total 0.4  0.0 - 1.2 mg/dL   Alkaline Phosphatase 89 44 - 121 IU/L   AST 19 0 - 40 IU/L   ALT 16 0 - 32 IU/L  Lipid panel   Collection Time: 05/09/23  3:54 PM  Result Value Ref Range   Cholesterol, Total 302 (H) 100 - 199 mg/dL   Triglycerides 093 (H) 0 - 149 mg/dL   HDL 56 >23 mg/dL   VLDL Cholesterol Cal 64 (H) 5 - 40 mg/dL   LDL Chol Calc (NIH) 557 (H) 0 - 99 mg/dL   LDL CALC COMMENT: Comment    Chol/HDL Ratio 5.4 (H) 0.0 - 4.4 ratio  Thyroid  Panel With TSH   Collection Time: 05/09/23  3:54 PM  Result Value Ref Range   TSH 4.890 (H) 0.450 - 4.500 uIU/mL   T4, Total 8.3 4.5 - 12.0 ug/dL   T3 Uptake Ratio 26 24 - 39 %   Free Thyroxine Index 2.2 1.2 - 4.9  VITAMIN D  25 Hydroxy (Vit-D Deficiency, Fractures)   Collection Time: 05/09/23  3:54 PM  Result Value Ref Range   Vit D, 25-Hydroxy 20.1 (L) 30.0 - 100.0 ng/mL       05/09/2023    3:06 PM  Depression screen PHQ 2/9  Decreased Interest 0  Down, Depressed, Hopeless 0  PHQ - 2 Score 0  Altered sleeping 1  Tired, decreased energy 1  Change in appetite 0  Feeling bad or failure about yourself  1  Trouble concentrating 0  Moving slowly  or fidgety/restless 1  Suicidal thoughts 1  PHQ-9 Score 5  Difficult doing work/chores Very difficult       05/09/2023    3:09 PM  GAD 7 : Generalized Anxiety Score  Nervous, Anxious, on Edge 1  Control/stop worrying 0  Worry too much - different things 0  Trouble relaxing 0  Restless 1  Easily annoyed or irritable 0  Afraid - awful might happen 0  Total GAD 7 Score 2  Anxiety Difficulty Not difficult at all    Pertinent labs & imaging results that were available during my care of the patient were reviewed by me and considered in my medical decision making.  Assessment & Plan:  [name] was seen today for medical management of chronic issues.  Diagnoses and all orders for this visit: 1. Mixed hyperlipidemia (Primary) Labs as below. Will communicate results to patient once available. Will await results to determine next steps.  Fasting. Patient to continue RYR. Discussed diet and lifestyle management.  - Lipid panel  2. Thyroid  disease Stable. Labs as below. Will communicate results to patient once available. Will await results to determine next steps.  Continue current regimen.  - CMP14+EGFR - CBC with Differential/Platelet - TSH  3. Sleep apnea in adult Requested records for sleep studies. Patient not currently using CPAP.   4. Osteopenia, unspecified location Patient is optimizing vitamin D  and calcium. Dexa completed today.   5. Anxiety Stable. Denies SI. Safety contract established. Refill provided.  - hydrOXYzine (ATARAX) 25 MG tablet; Take 1 tablet (25 mg total) by mouth every 8 (eight) hours as needed.  Dispense: 90 tablet; Refill: 0  6. BRCA2 positive Reviewed notes from 07/29/2020, Magrinat, MD, Oncology  Patient recommended to have yearly mammograms and breast exams with provider. Will complete at CPE.  Per note "non-Hodgkin's lymphoma is very low-grade, and does not require therapy unless she develops anemia or thrombocytopenia due to the lymphoma or unless  she develops significant "B" symptoms, which include  unexplained weight loss of 10% or more body weight, unexplained recurrent fevers and drenching sweats, unexplained fatigue, or clinically significant palpable adenopathy"  Labs as above. Will continue to monitor for symptoms.  - MM 3D SCREENING MAMMOGRAM BILATERAL BREAST; Future  7. 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  - MM 3D SCREENING MAMMOGRAM BILATERAL BREAST; Future  8. Non-Hodgkin's lymphoma, unspecified body region, unspecified non-Hodgkin lymphoma type (HCC) As above.  - MM 3D SCREENING MAMMOGRAM BILATERAL BREAST; Future  9. Vitamin D  deficiency Taking supplement  - VITAMIN D  25 Hydroxy (Vit-D Deficiency, Fractures)  10. Sinus congestion Will start medication as below.  Encouraged patient to follow up if they have worsening symptoms or signs. Discussed at home care such as humidifier, saline spray, throat lozenges, increasing hydration, and tylenol  for pain or fever.  - doxycycline (VIBRA-TABS) 100 MG tablet; Take 1 tablet (100 mg total) by mouth 2 (two) times daily for 7 days.  Dispense: 14 tablet; Refill: 0  Continue all other maintenance medications.  Follow up plan: Return in about 3 months (around 11/08/2023), or if symptoms worsen or fail to improve, for Chronic Condition Follow up.  Continue healthy lifestyle choices, including diet (rich in fruits, vegetables, and lean proteins, and low in salt and simple carbohydrates) and exercise (at least 30 minutes of moderate physical activity daily).  Written and verbal instructions provided   The above assessment and management plan was discussed with the patient. The patient verbalized understanding of and has agreed to the management plan. Patient is aware to call the clinic if they develop any new symptoms or if symptoms persist or worsen. Patient is aware when to return to the clinic for a follow-up visit. Patient educated on when it is  appropriate to go to the emergency department.   Jacqualyn Mates, DNP-FNP [facility] [address] [place], [place] [address] [phone]

## 2023-08-09 ENCOUNTER — Encounter: Payer: Self-pay | Admitting: Family Medicine

## 2023-08-09 ENCOUNTER — Other Ambulatory Visit: Payer: Self-pay | Admitting: Family Medicine

## 2023-08-09 DIAGNOSIS — R0981 Nasal congestion: Secondary | ICD-10-CM

## 2023-08-10 MED ORDER — LEVOTHYROXINE SODIUM 88 MCG PO TABS: 88.0000 ug | ORAL_TABLET | Freq: Every day | ORAL | 0 refills | Status: DC

## 2023-08-10 MED ORDER — FLUCONAZOLE 150 MG PO TABS: 150.0000 mg | ORAL_TABLET | Freq: Once | ORAL | 0 refills | Status: AC

## 2023-08-11 ENCOUNTER — Ambulatory Visit: Payer: Self-pay | Admitting: Family Medicine

## 2023-08-11 NOTE — Progress Notes (Signed)
 DEXA scan showed osteopenia.  Recommend Vit D 800 IU daily, 1200mg  of Calcium daily. Weight bearing exercise. Recheck in 2 years.

## 2023-08-30 ENCOUNTER — Other Ambulatory Visit: Payer: Self-pay | Admitting: Family Medicine

## 2023-08-30 DIAGNOSIS — F419 Anxiety disorder, unspecified: Secondary | ICD-10-CM

## 2023-11-03 ENCOUNTER — Other Ambulatory Visit: Payer: Self-pay | Admitting: *Deleted

## 2023-11-03 MED ORDER — CITALOPRAM HYDROBROMIDE 40 MG PO TABS: 40.0000 mg | ORAL_TABLET | Freq: Every day | ORAL | 0 refills | Status: DC

## 2023-11-03 MED ORDER — LOSARTAN POTASSIUM 50 MG PO TABS: 50.0000 mg | ORAL_TABLET | Freq: Two times a day (BID) | ORAL | 0 refills | Status: DC

## 2023-11-08 ENCOUNTER — Other Ambulatory Visit: Payer: Self-pay | Admitting: *Deleted

## 2023-11-08 MED ORDER — LEVOTHYROXINE SODIUM 88 MCG PO TABS: 88.0000 ug | ORAL_TABLET | Freq: Every day | ORAL | 1 refills | Status: AC

## 2023-11-08 MED ORDER — MELOXICAM 7.5 MG PO TABS: 15.0000 mg | ORAL_TABLET | Freq: Every day | ORAL | 0 refills | Status: DC

## 2023-11-09 ENCOUNTER — Ambulatory Visit: Admitting: Family Medicine

## 2023-11-09 ENCOUNTER — Encounter: Payer: Self-pay | Admitting: Family Medicine

## 2023-11-09 VITALS — BP 123/78 | HR 75 | Temp 98.0°F | Ht 64.0 in | Wt 201.2 lb

## 2023-11-09 DIAGNOSIS — F419 Anxiety disorder, unspecified: Secondary | ICD-10-CM

## 2023-11-09 DIAGNOSIS — M8589 Other specified disorders of bone density and structure, multiple sites: Secondary | ICD-10-CM

## 2023-11-09 DIAGNOSIS — Z1501 Genetic susceptibility to malignant neoplasm of breast: Secondary | ICD-10-CM

## 2023-11-09 DIAGNOSIS — I10 Essential (primary) hypertension: Secondary | ICD-10-CM | POA: Insufficient documentation

## 2023-11-09 DIAGNOSIS — E782 Mixed hyperlipidemia: Secondary | ICD-10-CM

## 2023-11-09 DIAGNOSIS — E559 Vitamin D deficiency, unspecified: Secondary | ICD-10-CM | POA: Diagnosis not present

## 2023-11-09 DIAGNOSIS — E079 Disorder of thyroid, unspecified: Secondary | ICD-10-CM

## 2023-11-09 DIAGNOSIS — C8234 Follicular lymphoma grade IIIa, lymph nodes of axilla and upper limb: Secondary | ICD-10-CM | POA: Diagnosis not present

## 2023-11-09 DIAGNOSIS — Z1509 Genetic susceptibility to other malignant neoplasm: Secondary | ICD-10-CM

## 2023-11-09 DIAGNOSIS — R202 Paresthesia of skin: Secondary | ICD-10-CM

## 2023-11-09 LAB — LIPID PANEL

## 2023-11-09 MED ORDER — LOSARTAN POTASSIUM 50 MG PO TABS
50.0000 mg | ORAL_TABLET | Freq: Two times a day (BID) | ORAL | 1 refills | Status: AC
Start: 2023-11-09 — End: ?

## 2023-11-09 MED ORDER — CITALOPRAM HYDROBROMIDE 40 MG PO TABS: 40.0000 mg | ORAL_TABLET | Freq: Every day | ORAL | 1 refills | Status: AC

## 2023-11-09 NOTE — Progress Notes (Signed)
 Subjective:  Patient ID: [name], female    DOB: [birth year], 63 y.o.   MRN: [id number]  Patient Care Team: Severa Rock HERO, FNP as PCP - General (Family Medicine) Aron Shoulders, MD as Consulting Physician (General Surgery) Dewey Rush, MD as Consulting Physician (Radiation Oncology) Rox Charleston, MD as Consulting Physician (Obstetrics and Gynecology) Micheline Eleanor BIRCH, NP as Nurse Practitioner (Gynecologic Oncology) Rosalynn LELON Ingle, MD (Inactive) as Consulting Physician (Obstetrics and Gynecology)   Chief Complaint:  Establish Care (Previous Marry patient ), Medical Management of Chronic Issues, and Numbness (Bilateral lower arms x 1 month )   HPI: [name] is a 63 y.o. female presenting on 11/09/2023 for Establish Care (Previous Marry patient ), Medical Management of Chronic Issues, and Numbness (Bilateral lower arms x 1 month )   [name] is a 63 year old female who presents to establish care.  She has a history of stage 3A non-Hodgkin's follicular lymphoma, which was discovered incidentally. She has not experienced symptoms such as exhaustion, weight loss, or abnormal lymph nodes.  In 2022, she underwent prophylactic oophorectomy. No malignancy was found. She retains her cervix and uterus, with her last cervical cancer screening performed at the time of her surgery.  She manages her cholesterol through diet and takes red yeast rice, two capsules twice daily. She has a vitamin D  deficiency and takes 5000 units daily.  She is on 88 micrograms of levothyroxine  for thyroid  disease and reports fatigue but no other symptoms such as changes in hair, skin, or nails, constipation, diarrhea, or temperature intolerance. She experiences normal temperature fluctuations, feeling cold at bedtime and sweating in the living room.  She takes losartan  for hypertension, initially started on 50 mg, increased to 100 mg, and then adjusted to 50 mg twice daily due to  headaches. She has been on this medication since August or September 2024. She reports new onset paresthesia in the last month, affecting the last three fingers on each hand and toes. The paresthesia is transient and occurs in various situations, such as when holding hands or resting her hand on the seatbelt.  She takes Celexa  once daily for anxiety and reports doing well with it, with no significant panic attacks recently. She occasionally experiences 'nervous leg' movements.  She has a history of radiation therapy to the right breast and chemotherapy, which was stopped after nine treatments due to her body's response. She experienced peripheral neuropathy in 2014 affecting both hands and feet, which resolved a few years after stopping chemotherapy.          Relevant past medical, surgical, family, and social history reviewed and updated as indicated.  Allergies and medications reviewed and updated. Data reviewed: Chart in Epic.   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   De Quervain's syndrome (tenosynovitis)    Left wrist   Depression    Family history of BRCA2 gene positive    Fibromyalgia    Follicular lymphoma grade 3a [facility]) oncologist-- dr layla   dx 03-02-2018  s/p left axilly node dissection (NHL),  no further treatment other than active survillance   History of gout  02-21-2019  per pt has years since last episode   History of radiation therapy    RIGHT BREAST COMPLETED 12/ 2012   History of right breast cancer oncologist--  dr layla--  no recurrence   dx 04/ 2012--  IDC, Stage IIA, Grade 3, 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);  07-08-2010 s/p breast lumpecotmy w/ node dissection;  completed  chemoradiation 12/ 2012   History of TMJ syndrome    Hyperlipidemia    Hypothyroidism    Malignant neoplasm of upper-outer quadrant of right breast in female, estrogen receptor positive (HCC) 04/10/2017   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 sx to straighten dental bite.   PORT-A-CATH REMOVAL  12/28/2010   RADIOACTIVE SEED GUIDED AXILLARY SENTINEL LYMPH NODE Left 03/02/2018   Procedure: SEED TARGETED DEEP LEFT AXILLARY LYMPH NODE BIOPSY;  Surgeon: Aron Shoulders, MD;  Location: [facility] OR;  Service: General;  Laterality: Left;   ROBOTIC ASSISTED SALPINGO OOPHERECTOMY Bilateral 02/25/2019   Procedure: XI ROBOTIC ASSISTED SALPINGO OOPHORECTOMY;  Surgeon: Eloy Herring, MD;  Location: [facility] [place];  Service: Gynecology;  Laterality: Bilateral;    Social History   Socioeconomic History   Marital status: Married    Spouse name: Not on file   Number of children: Not on file   Years of education: Not on file   Highest education level: Bachelor's degree (e.g., BA, AB, BS)  Occupational History   Not on file  Tobacco Use   Smoking status: Never   Smokeless tobacco: Never  Vaping Use   Vaping status: Never Used  Substance and Sexual Activity   Alcohol use: Not Currently   Drug use: Never   Sexual activity: Not on file  Other Topics Concern   Not on file  Social History Narrative   Not on file   Social Drivers of Health   Financial Resource Strain:  Low Risk  (11/08/2023)   Overall Financial Resource Strain (CARDIA)    Difficulty of Paying Living Expenses: Not very hard  Food Insecurity: No Food Insecurity (11/08/2023)   Hunger Vital Sign    Worried About Running Out of Food in the Last Year: Never true    Ran Out of Food in the Last Year: Never true  Transportation Needs: No Transportation Needs (11/08/2023)   PRAPARE - Administrator, Civil Service (Medical): No    Lack of Transportation (Non-Medical): No  Physical Activity: Unknown (11/08/2023)   Exercise Vital Sign    Days of Exercise per Week: 4 days    Minutes of Exercise per Session: Patient declined  Stress: No Stress Concern Present (11/08/2023)   [employer] of Occupational Health - Occupational Stress Questionnaire    Feeling of Stress: Only a little  Social Connections: Socially Integrated (11/08/2023)   Social Connection and Isolation Panel    Frequency of Communication with Friends and Family: Three times a week  Frequency of Social Gatherings with Friends and Family: More than three times a week    Attends Religious Services: 1 to 4 times per year    Active Member of [employer] or Organizations: Yes    Attends Banker Meetings: More than 4 times per year    Marital Status: Married  Catering manager Violence: Not on file    Outpatient Encounter Medications as of 11/09/2023  Medication Sig   aspirin 81 MG chewable tablet Chew 81 mg by mouth daily.   hydrOXYzine  (ATARAX ) 25 MG tablet TAKE 1 TABLET BY MOUTH EVERY 8 HOURS AS NEEDED.   levothyroxine  (SYNTHROID ) 88 MCG tablet Take 1 tablet (88 mcg total) by mouth daily before breakfast.   meloxicam  (MOBIC ) 7.5 MG tablet Take 2 tablets (15 mg total) by mouth daily.   Triamcinolone Acetonide (NASACORT ALLERGY 24HR NA)    valACYclovir (VALTREX) 1000 MG tablet Take 1,000 mg by mouth 2 (two) times daily.   [DISCONTINUED] citalopram  (CELEXA ) 40 MG tablet Take 1 tablet (40 mg total) by mouth daily.    [DISCONTINUED] losartan  (COZAAR ) 50 MG tablet Take 1 tablet (50 mg total) by mouth in the morning and at bedtime.   citalopram  (CELEXA ) 40 MG tablet Take 1 tablet (40 mg total) by mouth daily.   losartan  (COZAAR ) 50 MG tablet Take 1 tablet (50 mg total) by mouth in the morning and at bedtime.   No facility-administered encounter medications on file as of 11/09/2023.    No Known Allergies  Pertinent ROS per HPI, otherwise unremarkable      Objective:  BP 123/78   Pulse 75   Temp 98 F (36.7 C)   Ht 5' 4 (1.626 m)   Wt 201 lb 3.2 oz (91.3 kg)   BMI 34.54 kg/m    Wt Readings from Last 3 Encounters:  11/09/23 201 lb 3.2 oz (91.3 kg)  08/08/23 193 lb 6.4 oz (87.7 kg)  05/09/23 189 lb (85.7 kg)    Physical Exam Constitutional:      General: She is not in acute distress.    Appearance: Normal appearance. She is obese. She is not ill-appearing, toxic-appearing or diaphoretic.  HENT:     Head: Normocephalic and atraumatic.     Mouth/Throat:     Mouth: Mucous membranes are moist.  Eyes:     Pupils: Pupils are equal, round, and reactive to light.  Cardiovascular:     Rate and Rhythm: Normal rate and regular rhythm.     Heart sounds: Normal heart sounds.  Pulmonary:     Effort: Pulmonary effort is normal.     Breath sounds: Normal breath sounds.  Musculoskeletal:     Cervical back: Neck supple.     Right lower leg: No edema.     Left lower leg: No edema.  Skin:    General: Skin is warm and dry.     Capillary Refill: Capillary refill takes less than 2 seconds.  Neurological:     General: No focal deficit present.     Mental Status: She is alert and oriented to person, place, and time.     GCS: GCS eye subscore is 4. GCS verbal subscore is 5. GCS motor subscore is 6.     Cranial Nerves: Cranial nerves 2-12 are intact.     Sensory: Sensation is intact.     Motor: Motor function is intact.     Coordination: Coordination is intact.     Gait: Gait is intact.  Psychiatric:  Mood and Affect: Mood normal.        Behavior: Behavior normal.        Thought Content: Thought content normal.        Judgment: Judgment normal.     Results for orders placed or performed in visit on 05/09/23  CBC with Differential/Platelet   Collection Time: 05/09/23  3:54 PM  Result Value Ref Range   WBC 7.6 3.4 - 10.8 x10E3/uL   RBC 4.74 3.77 - 5.28 x10E6/uL   Hemoglobin 14.5 11.1 - 15.9 g/dL   Hematocrit 56.1 65.9 - 46.6 %   MCV 92 79 - 97 fL   MCH 30.6 26.6 - 33.0 pg   MCHC 33.1 31.5 - 35.7 g/dL   RDW 87.1 88.2 - 84.5 %   Platelets 195 150 - 450 x10E3/uL   Neutrophils 56 Not Estab. %   Lymphs 24 Not Estab. %   Monocytes 12 Not Estab. %   Eos 7 Not Estab. %   Basos 1 Not Estab. %   Neutrophils Absolute 4.2 1.4 - 7.0 x10E3/uL   Lymphocytes Absolute 1.9 0.7 - 3.1 x10E3/uL   Monocytes Absolute 0.9 0.1 - 0.9 x10E3/uL   EOS (ABSOLUTE) 0.5 (H) 0.0 - 0.4 x10E3/uL   Basophils Absolute 0.1 0.0 - 0.2 x10E3/uL   Immature Granulocytes 0 Not Estab. %   Immature Grans (Abs) 0.0 0.0 - 0.1 x10E3/uL  CMP14+EGFR   Collection Time: 05/09/23  3:54 PM  Result Value Ref Range   Glucose 76 70 - 99 mg/dL   BUN 9 8 - 27 mg/dL   Creatinine, Ser 9.31 0.57 - 1.00 mg/dL   eGFR 98 >40 fO/fpw/8.26   BUN/Creatinine Ratio 13 12 - 28   Sodium 140 134 - 144 mmol/L   Potassium 4.4 3.5 - 5.2 mmol/L   Chloride 101 96 - 106 mmol/L   CO2 22 20 - 29 mmol/L   Calcium 9.6 8.7 - 10.3 mg/dL   Total Protein 6.8 6.0 - 8.5 g/dL   Albumin 4.2 3.9 - 4.9 g/dL   Globulin, Total 2.6 1.5 - 4.5 g/dL   Bilirubin Total 0.4 0.0 - 1.2 mg/dL   Alkaline Phosphatase 89 44 - 121 IU/L   AST 19 0 - 40 IU/L   ALT 16 0 - 32 IU/L  Lipid panel   Collection Time: 05/09/23  3:54 PM  Result Value Ref Range   Cholesterol, Total 302 (H) 100 - 199 mg/dL   Triglycerides 670 (H) 0 - 149 mg/dL   HDL 56 >60 mg/dL   VLDL Cholesterol Cal 64 (H) 5 - 40 mg/dL   LDL Chol Calc (NIH) 817 (H) 0 - 99 mg/dL   LDL CALC COMMENT:  Comment    Chol/HDL Ratio 5.4 (H) 0.0 - 4.4 ratio  Thyroid  Panel With TSH   Collection Time: 05/09/23  3:54 PM  Result Value Ref Range   TSH 4.890 (H) 0.450 - 4.500 uIU/mL   T4, Total 8.3 4.5 - 12.0 ug/dL   T3 Uptake Ratio 26 24 - 39 %   Free Thyroxine Index 2.2 1.2 - 4.9  VITAMIN D  25 Hydroxy (Vit-D Deficiency, Fractures)   Collection Time: 05/09/23  3:54 PM  Result Value Ref Range   Vit D, 25-Hydroxy 20.1 (L) 30.0 - 100.0 ng/mL       Pertinent labs & imaging results that were available during my care of the patient were reviewed by me and considered in my medical decision making.  Assessment & Plan:  Mariangel was  seen today for establish care, medical management of chronic issues and numbness.  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 -     Anemia Profile B  Thyroid  disease -     Thyroid  Panel With TSH -     Lipid panel  Anxiety -     Thyroid  Panel With TSH -     citalopram  (CELEXA ) 40 MG tablet; Take 1 tablet (40 mg total) by mouth daily.  BRCA2 positive -     Anemia Profile B  Osteopenia of multiple sites -     Vitamin D , 25-hydroxy -     CMP14+EGFR  Paresthesias -     Anemia Profile B -     CMP14+EGFR     Peripheral paresthesias, intermittent, etiology under evaluation Intermittent peripheral paresthesias in hands and feet, possibly related to previous chemotherapy, B12 deficiency, or thyroid  disorder. Symptoms have worsened in the last month. - Order lab work to check B12, ferritin, and iron levels. - Consider changing losartan  if lab work is normal and symptoms  persist.  Essential hypertension Hypertension managed with losartan  50 mg twice daily. Blood pressure is well-controlled. Reports new onset of intermittent paresthesias, possibly related to losartan , though this is less than 2% likely. - Consider switching losartan  to lisinopril if lab work is normal and paresthesias persist.  Disorder of thyroid  Thyroid  disorder managed with levothyroxine  88 mcg daily. Reports fatigue but no other symptoms of thyroid  dysfunction. - Order thyroid  function tests to assess current thyroid  status.  Follicular lymphoma, stage IIIA Stage IIIA follicular lymphoma, an incidental finding with no current symptoms. Released from oncology care with instructions to return if symptomatic. No current lymphadenopathy, fatigue, or weight loss reported.  Mixed hyperlipidemia Mixed hyperlipidemia managed with diet and red yeast rice, 2 capsules twice daily. - Continue red yeast rice 2 capsules twice daily.  Vitamin D  deficiency Vitamin D  deficiency managed with daily supplementation of 2000 IU. - Continue daily vitamin D  supplementation of 2000 IU.  Anxiety disorder Anxiety disorder managed with Celexa , taken once daily. Reports occasional nervous leg movements but no significant panic attacks recently. - Continue Celexa  as prescribed.          Continue all other maintenance medications.  Follow up plan: Return in 6 months (on 05/11/2024), or if symptoms worsen or fail to improve, for Annual Physical.   Continue healthy lifestyle choices, including diet (rich in fruits, vegetables, and lean proteins, and low in salt and simple carbohydrates) and exercise (at least 30 minutes of moderate physical activity daily).  Educational handout given for health maintenance, paresthesias   The above assessment and management plan was discussed with the patient. The patient verbalized understanding of and has agreed to the management plan. Patient is aware to call the clinic  if they develop any new symptoms or if symptoms persist or worsen. Patient is aware when to return to the clinic for a follow-up visit. Patient educated on  when it is appropriate to go to the emergency department.   Rosaline Bruns, FNP-C [facility] [phone]

## 2023-11-10 ENCOUNTER — Ambulatory Visit: Payer: Self-pay | Admitting: Family Medicine

## 2023-11-10 LAB — CMP14+EGFR
ALT: 22 IU/L (ref 0–32)
AST: 23 IU/L (ref 0–40)
Albumin: 4.4 g/dL (ref 3.9–4.9)
Alkaline Phosphatase: 72 IU/L (ref 44–121)
BUN/Creatinine Ratio: 14 (ref 12–28)
BUN: 12 mg/dL (ref 8–27)
Bilirubin Total: 0.7 mg/dL (ref 0.0–1.2)
CO2: 23 mmol/L (ref 20–29)
Calcium: 9.6 mg/dL (ref 8.7–10.3)
Chloride: 100 mmol/L (ref 96–106)
Creatinine, Ser: 0.87 mg/dL (ref 0.57–1.00)
Globulin, Total: 2.2 g/dL (ref 1.5–4.5)
Glucose: 86 mg/dL (ref 70–99)
Potassium: 4.3 mmol/L (ref 3.5–5.2)
Sodium: 137 mmol/L (ref 134–144)
Total Protein: 6.6 g/dL (ref 6.0–8.5)
eGFR: 74 mL/min/1.73 (ref >59)

## 2023-11-10 LAB — ANEMIA PROFILE B
Basophils Absolute: 0.1 x10E3/uL (ref 0.0–0.2)
Basos: 1 %
EOS (ABSOLUTE): 0.6 x10E3/uL — ABNORMAL HIGH (ref 0.0–0.4)
Eos: 8 %
Ferritin: 174 ng/mL — AB (ref 15–150)
Folate: 11.3 ng/mL (ref >3.0)
Hematocrit: 40.9 % (ref 34.0–46.6)
Hemoglobin: 13.4 g/dL (ref 11.1–15.9)
Immature Grans (Abs): 0 x10E3/uL (ref 0.0–0.1)
Immature Granulocytes: 0 %
Iron Saturation: 33 (ref 15–55)
Iron: 108 ug/dL (ref 27–139)
Lymphocytes Absolute: 1.5 x10E3/uL (ref 0.7–3.1)
Lymphs: 21 %
MCH: 30.6 pg (ref 26.6–33.0)
MCHC: 32.8 g/dL (ref 31.5–35.7)
MCV: 93 fL (ref 79–97)
Monocytes Absolute: 0.7 x10E3/uL (ref 0.1–0.9)
Monocytes: 10 %
Neutrophils Absolute: 4.1 x10E3/uL (ref 1.4–7.0)
Neutrophils: 60 %
Platelets: 179 x10E3/uL (ref 150–450)
RBC: 4.38 x10E6/uL (ref 3.77–5.28)
RDW: 13.1 % (ref 11.7–15.4)
Retic Ct Pct: 1.9 % (ref 0.6–2.6)
Total Iron Binding Capacity: 324 ug/dL (ref 250–450)
UIBC: 216 ug/dL (ref 118–369)
Vitamin B-12: 1066 pg/mL (ref 232–1245)
WBC: 6.9 x10E3/uL (ref 3.4–10.8)

## 2023-11-10 LAB — THYROID PANEL WITH TSH
Free Thyroxine Index: 2.9 (ref 1.2–4.9)
T3 Uptake Ratio: 31 (ref 24–39)
T4, Total: 9.4 ug/dL (ref 4.5–12.0)
TSH: 1.57 u[IU]/mL (ref 0.450–4.500)

## 2023-11-10 LAB — LIPID PANEL
Cholesterol, Total: 289 mg/dL — AB (ref 100–199)
HDL: 59 mg/dL (ref >39)
LDL CALC COMMENT:: 4.9 ratio — AB (ref 0.0–4.4)
LDL Chol Calc (NIH): 183 mg/dL — AB (ref 0–99)
Triglycerides: 249 mg/dL — AB (ref 0–149)
VLDL Cholesterol Cal: 47 mg/dL — AB (ref 5–40)

## 2023-11-10 LAB — VITAMIN D 25 HYDROXY (VIT D DEFICIENCY, FRACTURES): Vit D, 25-Hydroxy: 50.9 ng/mL (ref 30.0–100.0)

## 2023-11-14 MED ORDER — ATORVASTATIN CALCIUM 40 MG PO TABS: 40.0000 mg | ORAL_TABLET | Freq: Every evening | ORAL | 1 refills | Status: DC

## 2023-11-15 ENCOUNTER — Telehealth: Payer: Self-pay | Admitting: Family Medicine

## 2023-11-15 NOTE — Telephone Encounter (Unsigned)
 Copied from CRM [id number]. Topic: Clinical - Lab/Test Results >> Nov 14, 2023  4:32 PM Delon DASEN wrote: Reason for CRM: returning call about labs- [phone]

## 2023-11-15 NOTE — Telephone Encounter (Signed)
 Refer to lab results.

## 2024-02-06 ENCOUNTER — Other Ambulatory Visit: Payer: Self-pay | Admitting: Family Medicine

## 2024-03-05 ENCOUNTER — Ambulatory Visit: Payer: Self-pay | Admitting: Nurse Practitioner

## 2024-03-05 ENCOUNTER — Encounter: Payer: Self-pay | Admitting: Nurse Practitioner

## 2024-03-05 VITALS — BP 127/83 | HR 73 | Temp 97.1°F | Ht 64.0 in | Wt 204.0 lb

## 2024-03-05 DIAGNOSIS — J0100 Acute maxillary sinusitis, unspecified: Secondary | ICD-10-CM | POA: Diagnosis not present

## 2024-03-05 MED ORDER — DOXYCYCLINE HYCLATE 100 MG PO TABS: 100.0000 mg | ORAL_TABLET | Freq: Two times a day (BID) | ORAL | 0 refills | Status: DC

## 2024-03-05 MED ORDER — AMOXICILLIN-POT CLAVULANATE 875-125 MG PO TABS: 1.0000 | ORAL_TABLET | Freq: Two times a day (BID) | ORAL | 0 refills | Status: DC

## 2024-03-05 MED ORDER — FLUCONAZOLE 150 MG PO TABS: 150.0000 mg | ORAL_TABLET | Freq: Once | ORAL | 0 refills | Status: AC

## 2024-03-05 NOTE — Progress Notes (Addendum)
 "  Subjective:    Patient ID: [name], female    DOB: 27-Feb-1961, 63 y.o.   MRN: [id number]   Chief Complaint:   Sinus Problem This is a new problem. The current episode started 1 to 4 weeks ago. There has been no fever. Her pain is at a severity of 4/10. The pain is moderate. Associated symptoms include congestion, coughing and sinus pressure. Treatments tried: triaminic. The treatment provided mild relief.    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) 03/15/2018   Follicular lymphoma (HCC) 03/13/2018   Lymphoproliferative disorder, low grade B cell (HCC) 02/12/2018   History of therapeutic radiation 02/06/2018   BRCA2 positive 06/15/2017   Genetic testing 06/15/2017   Family history of breast cancer    Family history of prostate cancer    Family history of leukemia    Family history of pancreatic cancer    Vaginal dryness 08/19/2015   Family history of breast cancer in mother 08/19/2015   Mixed hyperlipidemia 01/03/2013   Peripheral neuropathy 01/03/2013   Anxiety 07/17/2012   Osteopenia 08/09/2011   Thyroid  disease        Review of Systems  HENT:  Positive for congestion and sinus pressure.   Respiratory:  Positive for cough.        Objective:   Physical Exam Vitals reviewed.  HENT:     Right Ear: Tympanic membrane normal.     Left Ear: Tympanic membrane normal.     Nose: Congestion and rhinorrhea present.     Right Sinus: Maxillary sinus tenderness present.     Left Sinus: Maxillary sinus tenderness present.     Mouth/Throat:     Mouth: Mucous membranes are dry.  Eyes:     Extraocular Movements: Extraocular movements intact.     Pupils: Pupils are equal, round, and reactive to light.  Cardiovascular:     Rate and Rhythm: Normal rate and  regular rhythm.     Heart sounds: Normal heart sounds.  Pulmonary:     Effort: Pulmonary effort is normal.     Breath sounds: Normal breath sounds.  Skin:    General: Skin is warm.  Neurological:     General: No focal deficit present.     Mental Status: She is oriented to person, place, and time.  Psychiatric:        Mood and Affect: Mood normal.        Behavior: Behavior normal.    BP 127/83   Pulse 73   Temp (!) 97.1 F (36.2 C) (Temporal)   Ht 5' 4 (1.626 m)   Wt 204 lb (92.5 kg)   SpO2 96%   BMI 35.02 kg/m         Assessment & Plan:   [name] in today with chief complaint of Sinus Problem   1. Acute non-recurrent maxillary sinusitis (Primary) 1. Take meds as prescribed 2. Use a cool mist humidifier especially during the winter months and when heat has been humid. 3. Use saline nose sprays frequently 4. Saline irrigations of the nose can be very helpful if done frequently.  * 4X daily for 1 week*  * Use of a nettie pot can be helpful with this. Follow directions with this* 5. Drink plenty of fluids 6.  Keep thermostat turn down low 7.For any cough or congestion- delsym 8. For fever or aces or pains- take tylenol  or ibuprofen  appropriate for age and weight.  * for fevers greater than 101 orally you may alternate ibuprofen  and tylenol  every  3 hours.   Meds ordered this encounter  Medications   DISCONTD: amoxicillin -clavulanate (AUGMENTIN ) 875-125 MG tablet    Sig: Take 1 tablet by mouth 2 (two) times daily.    Dispense:  14 tablet    Refill:  0    Supervising Provider:   MARYANNE CHEW A [[id number]]   doxycycline  (VIBRA -TABS) 100 MG tablet    Sig: Take 1 tablet (100 mg total) by mouth 2 (two) times daily.    Dispense:  20 tablet    Refill:  0    Supervising Provider:   DETTINGER, JOSHUA A [[id number]]   fluconazole  (DIFLUCAN ) 150 MG tablet    Sig: Take 1 tablet (150 mg total) by mouth once for 1 dose.    Dispense:  1 tablet    Refill:  0     Supervising Provider:   MARYANNE CHEW A [[id number]]     The above assessment and management plan was discussed with the patient. The patient verbalized understanding of and has agreed to the management plan. Patient is aware to call the clinic if symptoms persist or worsen. Patient is aware when to return to the clinic for a follow-up visit. Patient educated on when it is appropriate to go to the emergency department.   Mary-Margaret Gladis, FNP   "

## 2024-03-05 NOTE — Patient Instructions (Signed)

## 2024-03-05 NOTE — Addendum Note (Signed)
 Addended by: GLADIS MUSTARD on: 03/05/2024 09:34 AM   Modules accepted: Orders

## 2024-04-16 ENCOUNTER — Encounter: Payer: Self-pay | Admitting: Family Medicine

## 2024-04-16 ENCOUNTER — Ambulatory Visit: Admitting: Family Medicine

## 2024-04-16 VITALS — BP 118/75 | HR 97 | Temp 96.9°F | Ht 64.0 in | Wt 202.0 lb

## 2024-04-16 DIAGNOSIS — Z8619 Personal history of other infectious and parasitic diseases: Secondary | ICD-10-CM

## 2024-04-16 DIAGNOSIS — J329 Chronic sinusitis, unspecified: Secondary | ICD-10-CM

## 2024-04-16 MED ORDER — FLUCONAZOLE 150 MG PO TABS: ORAL_TABLET | ORAL | 0 refills | Status: AC

## 2024-04-16 MED ORDER — PREDNISONE 10 MG (21) PO TBPK: ORAL_TABLET | ORAL | 0 refills | Status: AC

## 2024-04-16 MED ORDER — CEFDINIR 300 MG PO CAPS: 300.0000 mg | ORAL_CAPSULE | Freq: Two times a day (BID) | ORAL | 0 refills | Status: AC

## 2024-04-16 MED ORDER — FLUTICASONE PROPIONATE 50 MCG/ACT NA SUSP: 2.0000 | Freq: Every day | NASAL | 6 refills | Status: AC

## 2024-04-16 MED ORDER — BENZONATATE 100 MG PO CAPS: 100.0000 mg | ORAL_CAPSULE | Freq: Two times a day (BID) | ORAL | 0 refills | Status: AC | PRN

## 2024-05-10 ENCOUNTER — Encounter: Payer: Self-pay | Admitting: Family Medicine
